# Patient Record
Sex: Female | Born: 1985 | Race: Black or African American | Hispanic: No | Marital: Single | Smoking: Current some day smoker
Health system: Southern US, Community
[De-identification: ages and names within clinical notes are randomized; demographics above are authoritative.]

## PROBLEM LIST (undated history)

## (undated) ENCOUNTER — Inpatient Hospital Stay (HOSPITAL_COMMUNITY): Payer: Self-pay

## (undated) DIAGNOSIS — O99345 Other mental disorders complicating the puerperium: Secondary | ICD-10-CM

## (undated) DIAGNOSIS — J45909 Unspecified asthma, uncomplicated: Secondary | ICD-10-CM

## (undated) DIAGNOSIS — M549 Dorsalgia, unspecified: Secondary | ICD-10-CM

## (undated) DIAGNOSIS — F4310 Post-traumatic stress disorder, unspecified: Secondary | ICD-10-CM

## (undated) DIAGNOSIS — F53 Postpartum depression: Secondary | ICD-10-CM

## (undated) DIAGNOSIS — G5600 Carpal tunnel syndrome, unspecified upper limb: Secondary | ICD-10-CM

## (undated) DIAGNOSIS — I10 Essential (primary) hypertension: Secondary | ICD-10-CM

## (undated) DIAGNOSIS — F419 Anxiety disorder, unspecified: Secondary | ICD-10-CM

---

## 2014-12-08 ENCOUNTER — Emergency Department (HOSPITAL_COMMUNITY)
Admission: EM | Admit: 2014-12-08 | Discharge: 2014-12-09 | Disposition: A | Discharge status: Home / Self Care | Payer: Medicaid Other | Attending: Emergency Medicine | Admitting: Emergency Medicine

## 2014-12-08 ENCOUNTER — Encounter (HOSPITAL_COMMUNITY): Payer: Self-pay | Admitting: Emergency Medicine

## 2014-12-08 DIAGNOSIS — R42 Dizziness and giddiness: Secondary | ICD-10-CM | POA: Diagnosis not present

## 2014-12-08 DIAGNOSIS — I10 Essential (primary) hypertension: Secondary | ICD-10-CM | POA: Diagnosis not present

## 2014-12-08 DIAGNOSIS — R112 Nausea with vomiting, unspecified: Secondary | ICD-10-CM

## 2014-12-08 DIAGNOSIS — F17210 Nicotine dependence, cigarettes, uncomplicated: Secondary | ICD-10-CM | POA: Diagnosis not present

## 2014-12-08 DIAGNOSIS — R197 Diarrhea, unspecified: Secondary | ICD-10-CM | POA: Insufficient documentation

## 2014-12-08 DIAGNOSIS — R1084 Generalized abdominal pain: Secondary | ICD-10-CM | POA: Diagnosis not present

## 2014-12-08 DIAGNOSIS — Z3202 Encounter for pregnancy test, result negative: Secondary | ICD-10-CM | POA: Insufficient documentation

## 2014-12-08 DIAGNOSIS — R51 Headache: Secondary | ICD-10-CM | POA: Insufficient documentation

## 2014-12-08 DIAGNOSIS — R6883 Chills (without fever): Secondary | ICD-10-CM | POA: Diagnosis not present

## 2014-12-08 HISTORY — DX: Essential (primary) hypertension: I10

## 2014-12-08 LAB — URINE MICROSCOPIC-ADD ON: RBC / HPF: NONE SEEN RBC/hpf (ref 0–5)

## 2014-12-08 LAB — CBC WITH DIFFERENTIAL/PLATELET
BASOS ABS: 0.1 10*3/uL (ref 0.0–0.1)
BASOS PCT: 0 %
EOS PCT: 1 %
Eosinophils Absolute: 0.2 10*3/uL (ref 0.0–0.7)
HEMATOCRIT: 49.2 % — AB (ref 36.0–46.0)
Hemoglobin: 18 g/dL — ABNORMAL HIGH (ref 12.0–15.0)
LYMPHS PCT: 10 %
Lymphs Abs: 1.7 10*3/uL (ref 0.7–4.0)
MCH: 32 pg (ref 26.0–34.0)
MCHC: 36.6 g/dL — AB (ref 30.0–36.0)
MCV: 87.5 fL (ref 78.0–100.0)
Monocytes Absolute: 1.5 10*3/uL — ABNORMAL HIGH (ref 0.1–1.0)
Monocytes Relative: 8 %
NEUTROS ABS: 14.3 10*3/uL — AB (ref 1.7–7.7)
Neutrophils Relative %: 81 %
PLATELETS: 373 10*3/uL (ref 150–400)
RBC: 5.62 MIL/uL — ABNORMAL HIGH (ref 3.87–5.11)
RDW: 11.8 % (ref 11.5–15.5)
WBC: 17.7 10*3/uL — AB (ref 4.0–10.5)

## 2014-12-08 LAB — URINALYSIS, ROUTINE W REFLEX MICROSCOPIC
GLUCOSE, UA: NEGATIVE mg/dL
Hgb urine dipstick: NEGATIVE
KETONES UR: 15 mg/dL — AB
NITRITE: NEGATIVE
PH: 6 (ref 5.0–8.0)
PROTEIN: 100 mg/dL — AB
Specific Gravity, Urine: 1.029 (ref 1.005–1.030)

## 2014-12-08 LAB — POC URINE PREG, ED: Preg Test, Ur: NEGATIVE

## 2014-12-08 MED ORDER — ACETAMINOPHEN 325 MG PO TABS
650.0000 mg | ORAL_TABLET | Freq: Once | ORAL | Status: AC
  Administered 2014-12-08: 650 mg via ORAL
  Filled 2014-12-08: qty 2

## 2014-12-08 MED ORDER — ONDANSETRON HCL 4 MG/2ML IJ SOLN
4.0000 mg | Freq: Once | INTRAMUSCULAR | Status: AC
  Administered 2014-12-08: 4 mg via INTRAVENOUS
  Filled 2014-12-08: qty 2

## 2014-12-08 MED ORDER — SODIUM CHLORIDE 0.9 % IV BOLUS (SEPSIS)
1000.0000 mL | Freq: Once | INTRAVENOUS | Status: AC
  Administered 2014-12-09: 1000 mL via INTRAVENOUS

## 2014-12-08 MED ORDER — SODIUM CHLORIDE 0.9 % IV BOLUS (SEPSIS)
1000.0000 mL | Freq: Once | INTRAVENOUS | Status: AC
  Administered 2014-12-08: 1000 mL via INTRAVENOUS

## 2014-12-08 NOTE — ED Notes (Addendum)
Per EMS, patient started vomiting around 2pm today with associated chills.  She has a headache, diarrhea, and vomiting.   Patient is from home.  Patient has not received flu shot.  Patient denies any falls or injuries.  Last BP from EMS was 135/56 was at  21:19 with HR 82  Ortho static vitals:  Lying: 100/60 Sitting: 90/60 Standing: 80 or less  P: 68 R: 26  CBG: 134

## 2014-12-08 NOTE — ED Provider Notes (Signed)
CSN: [id number]     Arrival date & time 12/08/14  2129 History   First MD Initiated Contact with Patient 12/08/14 2131     Chief Complaint  Patient presents with  . Emesis   Selena Haynes is a 29 y.o. female with a history of hypertension who presents to the emergency department complaining of nausea, vomiting, diarrhea, generalized abdominal pain, body aches and headache since today. Patient reports she woke up from a nap with a headache that then progressed to having vomiting and diarrhea. She reports after having vomiting and diarrhea she started having generalized abdominal pain which she rates at a 9 out of 10 currently. She also reports associated nausea and body aches. She reports 15 episodes of vomiting and 15 episodes of diarrhea today. She denies hematemesis, or hematochezia. She reports her previous abdominal surgical history includes a C-section. She reports her last menstrual cycle started 12/07/2014. Patient reports feeling lightheaded with standing. She reports trying to take Motrin for treatment today without relief. The patient denies fevers, syncope, rashes, urinary symptoms, hematemesis, hematochezia, chest pain, or double vision.   (Consider location/radiation/quality/duration/timing/severity/associated sxs/prior Treatment) HPI  Past Medical History  Diagnosis Date  . Hypertension    History reviewed. No pertinent past surgical history. No family history on file. Social History  Substance Use Topics  . Smoking status: Current Every Day Smoker -- 1.00 packs/day    Types: Cigarettes  . Smokeless tobacco: Never Used  . Alcohol Use: Yes   OB History    No data available     Review of Systems  Constitutional: Positive for chills. Negative for fever.  HENT: Negative for congestion, sore throat and trouble swallowing.   Eyes: Negative for visual disturbance.  Respiratory: Negative for cough, shortness of breath and wheezing.   Cardiovascular: Negative for chest pain  and palpitations.  Gastrointestinal: Positive for nausea, vomiting, abdominal pain and diarrhea. Negative for blood in stool.  Genitourinary: Negative for dysuria, urgency, frequency, hematuria, flank pain, decreased urine volume, difficulty urinating, genital sores and menstrual problem.  Musculoskeletal: Positive for myalgias. Negative for back pain, neck pain and neck stiffness.  Skin: Negative for rash and wound.  Neurological: Positive for light-headedness and headaches. Negative for dizziness, syncope, weakness and numbness.      Allergies  Review of patient's allergies indicates no known allergies.  Home Medications   Prior to Admission medications   Medication Sig Start Date End Date Taking? Authorizing Provider  ondansetron (ZOFRAN ODT) 4 MG disintegrating tablet Take 1 tablet (4 mg total) by mouth every 8 (eight) hours as needed for nausea or vomiting. 12/09/14   Everlene FarrierWilliam Arien Morine, PA-C   BP 116/79 mmHg  Pulse 85  Temp(Src) 98 F (36.7 C) (Oral)  Resp 22  SpO2 98%  LMP 12/08/2014 Physical Exam  Constitutional: She is oriented to person, place, and time. She appears well-developed and well-nourished. No distress.  Nontoxic appearing.  HENT:  Head: Normocephalic and atraumatic.  Mouth/Throat: Oropharynx is clear and moist. No oropharyngeal exudate.  Eyes: Conjunctivae and EOM are normal. Pupils are equal, round, and reactive to light. Right eye exhibits no discharge. Left eye exhibits no discharge.  Neck: Normal range of motion. Neck supple. No JVD present. No tracheal deviation present.  Cardiovascular: Normal rate, regular rhythm, normal heart sounds and intact distal pulses.  Exam reveals no gallop and no friction rub.   No murmur heard. Pulmonary/Chest: Effort normal and breath sounds normal. No respiratory distress. She has no wheezes. She has no rales.  Lungs are clear to auscultation bilaterally.  Abdominal: Soft. She exhibits no distension and no mass. There is  tenderness. There is no rebound and no guarding.  Abdomen is soft. Bowel sounds are present. Patient has mild generalized abdominal tenderness to palpation. No focal tenderness. No psoas or operators sign.  Musculoskeletal: She exhibits no edema or tenderness.  Lymphadenopathy:    She has no cervical adenopathy.  Neurological: She is alert and oriented to person, place, and time. No cranial nerve deficit. Coordination normal.  Skin: Skin is warm and dry. No rash noted. She is not diaphoretic. No erythema. No pallor.  Psychiatric: She has a normal mood and affect. Her behavior is normal.  Nursing note and vitals reviewed.   ED Course  Procedures (including critical care time) Labs Review Labs Reviewed  CBC WITH DIFFERENTIAL/PLATELET - Abnormal; Notable for the following:    WBC 17.7 (*)    RBC 5.62 (*)    Hemoglobin 18.0 (*)    HCT 49.2 (*)    MCHC 36.6 (*)    Neutro Abs 14.3 (*)    Monocytes Absolute 1.5 (*)    All other components within normal limits  URINALYSIS, ROUTINE W REFLEX MICROSCOPIC (NOT AT [facility]) - Abnormal; Notable for the following:    Color, Urine ORANGE (*)    APPearance TURBID (*)    Bilirubin Urine SMALL (*)    Ketones, ur 15 (*)    Protein, ur 100 (*)    Leukocytes, UA SMALL (*)    All other components within normal limits  COMPREHENSIVE METABOLIC PANEL - Abnormal; Notable for the following:    CO2 21 (*)    Creatinine, Ser 1.25 (*)    GFR calc non Af Amer 57 (*)    All other components within normal limits  URINE MICROSCOPIC-ADD ON - Abnormal; Notable for the following:    Squamous Epithelial / LPF 0-5 (*)    Bacteria, UA FEW (*)    Casts GRANULAR CAST (*)    All other components within normal limits  URINE CULTURE  LIPASE, BLOOD  POC URINE PREG, ED    Imaging Review No results found. I have personally reviewed and evaluated these lab results as part of my medical decision-making.   EKG Interpretation None      Filed Vitals:   12/08/14 2145  12/09/14 0018  BP: 121/70 116/79  Pulse: 94 85  Temp: 98 F (36.7 C)   TempSrc: Oral   Resp: 18 22  SpO2: 100% 98%     MDM   Meds given in ED:  Medications  sodium chloride 0.9 % bolus 1,000 mL (0 mLs Intravenous Stopped 12/09/14 0018)  ondansetron (ZOFRAN) injection 4 mg (4 mg Intravenous Given 12/08/14 2233)  acetaminophen (TYLENOL) tablet 650 mg (650 mg Oral Given 12/08/14 2233)  sodium chloride 0.9 % bolus 1,000 mL (1,000 mLs Intravenous New Bag/Given 12/09/14 0018)  ondansetron (ZOFRAN) injection 4 mg (4 mg Intravenous Given 12/09/14 0046)  promethazine (PHENERGAN) injection 25 mg (25 mg Intravenous Given 12/09/14 0117)    New Prescriptions   ONDANSETRON (ZOFRAN ODT) 4 MG DISINTEGRATING TABLET    Take 1 tablet (4 mg total) by mouth every 8 (eight) hours as needed for nausea or vomiting.    Final diagnoses:  Nausea vomiting and diarrhea  Generalized abdominal pain   This is a 29 y.o. female with a history of hypertension who presents to the emergency department complaining of nausea, vomiting, diarrhea, generalized abdominal pain, body aches and headache  since today. Patient reports she woke up from a nap with a headache that then progressed to having vomiting and diarrhea. She reports after having vomiting and diarrhea she started having generalized abdominal pain which she rates at a 9 out of 10 currently. She also reports associated nausea and body aches. She reports 15 episodes of vomiting and 15 episodes of diarrhea today. She denies hematemesis, or hematochezia. On exam patient is afebrile nontoxic appearing. Her abdomen is soft and she has mild generalized abdominal tenderness to palpation without focal tenderness. No peritoneal signs. Will check blood work and provide a fluid bolus and zofran.  Patient has negative urine pregnancy test. CBC indicates a white count of 17,000 with a hemoglobin of 18.0. I suspect dehydration. Patient's CMP indicates a creatinine of 1.25.  Patient's lipase is within normal limits at 26. Urinalysis shows small leukocytes and nitrite negative. Patient denies urinary symptoms however urine sent for culture. At reevaluation patient is still having some nausea. Patient is working on her second liter fluid bolus. Patient ordered Phenergan. Patchy change patient is still awaiting Phenergan to come from pharmacy. Patient's care handed off to [facility], PA-C at shift change who will disposition the patient.  I advised the patient to have her creatinine rechecked next week when she is feeling better. I encouraged her to push fluids. She will be sent home with prescriptions for Zofran. I advised the patient to follow-up with their primary care provider this week. I advised the patient to return to the emergency department with new or worsening symptoms or new concerns. The patient verbalized understanding and agreement with plan.    This patient was discussed with Dr. Mora Bellman who agrees with assessment and plan.   Everlene Farrier, PA-C 12/09/14 1610  Tomasita Crumble, MD 12/09/14 [phone]

## 2014-12-08 NOTE — ED Notes (Signed)
Bed: WA02 Expected date:  Expected time:  Means of arrival:  Comments: EMS 29yo F vomiting / diarrhea / headache

## 2014-12-09 LAB — COMPREHENSIVE METABOLIC PANEL
ALBUMIN: 4.4 g/dL (ref 3.5–5.0)
ALK PHOS: 74 U/L (ref 38–126)
ALT: 14 U/L (ref 14–54)
ANION GAP: 9 (ref 5–15)
AST: 15 U/L (ref 15–41)
BILIRUBIN TOTAL: 0.7 mg/dL (ref 0.3–1.2)
BUN: 18 mg/dL (ref 6–20)
CALCIUM: 9.1 mg/dL (ref 8.9–10.3)
CO2: 21 mmol/L — ABNORMAL LOW (ref 22–32)
Chloride: 110 mmol/L (ref 101–111)
Creatinine, Ser: 1.25 mg/dL — ABNORMAL HIGH (ref 0.44–1.00)
GFR, EST NON AFRICAN AMERICAN: 57 mL/min — AB (ref >60)
Glucose, Bld: 95 mg/dL (ref 65–99)
POTASSIUM: 4.1 mmol/L (ref 3.5–5.1)
Sodium: 140 mmol/L (ref 135–145)
TOTAL PROTEIN: 7.3 g/dL (ref 6.5–8.1)

## 2014-12-09 LAB — LIPASE, BLOOD: LIPASE: 26 U/L (ref 11–51)

## 2014-12-09 MED ORDER — PROMETHAZINE HCL 25 MG/ML IJ SOLN
25.0000 mg | Freq: Once | INTRAMUSCULAR | Status: AC
  Administered 2014-12-09: 25 mg via INTRAVENOUS
  Filled 2014-12-09: qty 1

## 2014-12-09 MED ORDER — ONDANSETRON 4 MG PO TBDP: 4.0000 mg | ORAL_TABLET | Freq: Three times a day (TID) | ORAL | Status: DC | PRN

## 2014-12-09 MED ORDER — ONDANSETRON HCL 4 MG/2ML IJ SOLN
4.0000 mg | Freq: Once | INTRAMUSCULAR | Status: AC
  Administered 2014-12-09: 4 mg via INTRAVENOUS
  Filled 2014-12-09: qty 2

## 2014-12-09 NOTE — Discharge Instructions (Signed)
Abdominal Pain, Adult °Many things can cause abdominal pain. Usually, abdominal pain is not caused by a disease and will improve without treatment. It can often be observed and treated at home. Your health care provider will do a physical exam and possibly order blood tests and X-rays to help determine the seriousness of your pain. However, in many cases, more time must pass before a clear cause of the pain can be found. Before that point, your health care provider may not know if you need more testing or further treatment. °HOME CARE INSTRUCTIONS °Monitor your abdominal pain for any changes. The following actions may help to alleviate any discomfort you are experiencing: °· Only take over-the-counter or prescription medicines as directed by your health care provider. °· Do not take laxatives unless directed to do so by your health care provider. °· Try a clear liquid diet (broth, tea, or water) as directed by your health care provider. Slowly move to a bland diet as tolerated. °SEEK MEDICAL CARE IF: °· You have unexplained abdominal pain. °· You have abdominal pain associated with nausea or diarrhea. °· You have pain when you urinate or have a bowel movement. °· You experience abdominal pain that wakes you in the night. °· You have abdominal pain that is worsened or improved by eating food. °· You have abdominal pain that is worsened with eating fatty foods. °· You have a fever. °SEEK IMMEDIATE MEDICAL CARE IF: °· Your pain does not go away within 2 hours. °· You keep throwing up (vomiting). °· Your pain is felt only in portions of the abdomen, such as the right side or the left lower portion of the abdomen. °· You pass bloody or black tarry stools. °MAKE SURE YOU: °· Understand these instructions. °· Will watch your condition. °· Will get help right away if you are not doing well or get worse. °  °This information is not intended to replace advice given to you by your health care provider. Make sure you discuss  any questions you have with your health care provider. °  °Document Released: 10/17/2004 Document Revised: 09/28/2014 Document Reviewed: 09/16/2012 °Elsevier Interactive Patient Education ©2016 [employer] °Nausea and Vomiting °Nausea is a sick feeling that often comes before throwing up (vomiting). Vomiting is a reflex where stomach contents come out of your mouth. Vomiting can cause severe loss of body fluids (dehydration). Children and elderly adults can become dehydrated quickly, especially if they also have diarrhea. Nausea and vomiting are symptoms of a condition or disease. It is important to find the cause of your symptoms. °CAUSES  °· Direct irritation of the stomach lining. This irritation can result from increased acid production (gastroesophageal reflux disease), infection, food poisoning, taking certain medicines (such as nonsteroidal anti-inflammatory drugs), alcohol use, or tobacco use. °· Signals from the brain. These signals could be caused by a headache, heat exposure, an inner ear disturbance, increased pressure in the brain from injury, infection, a tumor, or a concussion, pain, emotional stimulus, or metabolic problems. °· An obstruction in the gastrointestinal tract (bowel obstruction). °· Illnesses such as diabetes, hepatitis, gallbladder problems, appendicitis, kidney problems, cancer, sepsis, atypical symptoms of a heart attack, or eating disorders. °· Medical treatments such as chemotherapy and radiation. °· Receiving medicine that makes you sleep (general anesthetic) during surgery. °DIAGNOSIS °Your caregiver may ask for tests to be done if the problems do not improve after a few days. Tests may also be done if symptoms are severe or if the reason for the nausea   and vomiting is not clear. Tests may include: °· Urine tests. °· Blood tests. °· Stool tests. °· Cultures (to look for evidence of infection). °· X-rays or other imaging studies. °Test results can help your caregiver make  decisions about treatment or the need for additional tests. °TREATMENT °You need to stay well hydrated. Drink frequently but in small amounts. You may wish to drink water, sports drinks, clear broth, or eat frozen ice pops or gelatin dessert to help stay hydrated. When you eat, eating slowly may help prevent nausea. There are also some antinausea medicines that may help prevent nausea. °HOME CARE INSTRUCTIONS  °· Take all medicine as directed by your caregiver. °· If you do not have an appetite, do not force yourself to eat. However, you must continue to drink fluids. °· If you have an appetite, eat a normal diet unless your caregiver tells you differently. °· Eat a variety of complex carbohydrates (rice, wheat, potatoes, bread), lean meats, yogurt, fruits, and vegetables. °· Avoid high-fat foods because they are more difficult to digest. °· Drink enough water and fluids to keep your urine clear or pale yellow. °· If you are dehydrated, ask your caregiver for specific rehydration instructions. Signs of dehydration may include: °· Severe thirst. °· Dry lips and mouth. °· Dizziness. °· Dark urine. °· Decreasing urine frequency and amount. °· Confusion. °· Rapid breathing or pulse. °SEEK IMMEDIATE MEDICAL CARE IF:  °· You have blood or brown flecks (like coffee grounds) in your vomit. °· You have black or bloody stools. °· You have a severe headache or stiff neck. °· You are confused. °· You have severe abdominal pain. °· You have chest pain or trouble breathing. °· You do not urinate at least once every 8 hours. °· You develop cold or clammy skin. °· You continue to vomit for longer than 24 to 48 hours. °· You have a fever. °MAKE SURE YOU:  °· Understand these instructions. °· Will watch your condition. °· Will get help right away if you are not doing well or get worse. °  °This information is not intended to replace advice given to you by your health care provider. Make sure you discuss any questions you have with  your health care provider. °  °Document Released: 01/07/2005 Document Revised: 04/01/2011 Document Reviewed: 06/06/2010 °Elsevier Interactive Patient Education ©2016 [employer] °Diarrhea °Diarrhea is frequent loose and watery bowel movements. It can cause you to feel weak and dehydrated. Dehydration can cause you to become tired and thirsty, have a dry mouth, and have decreased urination that often is dark yellow. Diarrhea is a sign of another problem, most often an infection that will not last long. In most cases, diarrhea typically lasts 2-3 days. However, it can last longer if it is a sign of something more serious. It is important to treat your diarrhea as directed by your caregiver to lessen or prevent future episodes of diarrhea. °CAUSES  °Some common causes include: °· Gastrointestinal infections caused by viruses, bacteria, or parasites. °· Food poisoning or food allergies. °· Certain medicines, such as antibiotics, chemotherapy, and laxatives. °· Artificial sweeteners and fructose. °· Digestive disorders. °HOME CARE INSTRUCTIONS °· Ensure adequate fluid intake (hydration): Have 1 cup (8 oz) of fluid for each diarrhea episode. Avoid fluids that contain simple sugars or sports drinks, fruit juices, whole milk products, and sodas. Your urine should be clear or pale yellow if you are drinking enough fluids. Hydrate with an oral rehydration solution that you can purchase at pharmacies, retail   stores, and online. You can prepare an oral rehydration solution at home by mixing the following ingredients together: °¨  - tsp table salt. °¨ ¾ tsp baking soda. °¨  tsp salt substitute containing potassium chloride. °¨ 1  tablespoons sugar. °¨ 1 L (34 oz) of water. °· Certain foods and beverages may increase the speed at which food moves through the gastrointestinal (GI) tract. These foods and beverages should be avoided and include: °¨ Caffeinated and alcoholic beverages. °¨ High-fiber foods, such as raw fruits and  vegetables, nuts, seeds, and whole grain breads and cereals. °¨ Foods and beverages sweetened with sugar alcohols, such as xylitol, sorbitol, and mannitol. °· Some foods may be well tolerated and may help thicken stool including: °¨ Starchy foods, such as rice, toast, pasta, low-sugar cereal, oatmeal, grits, baked potatoes, crackers, and bagels. °¨ Bananas. °¨ Applesauce. °· Add probiotic-rich foods to help increase healthy bacteria in the GI tract, such as yogurt and fermented milk products. °· Wash your hands well after each diarrhea episode. °· Only take over-the-counter or prescription medicines as directed by your caregiver. °· Take a warm bath to relieve any burning or pain from frequent diarrhea episodes. °SEEK IMMEDIATE MEDICAL CARE IF:  °· You are unable to keep fluids down. °· You have persistent vomiting. °· You have blood in your stool, or your stools are black and tarry. °· You do not urinate in 6-8 hours, or there is only a small amount of very dark urine. °· You have abdominal pain that increases or localizes. °· You have weakness, dizziness, confusion, or light-headedness. °· You have a severe headache. °· Your diarrhea gets worse or does not get better. °· You have a fever or persistent symptoms for more than 2-3 days. °· You have a fever and your symptoms suddenly get worse. °MAKE SURE YOU:  °· Understand these instructions. °· Will watch your condition. °· Will get help right away if you are not doing well or get worse. °  °This information is not intended to replace advice given to you by your health care provider. Make sure you discuss any questions you have with your health care provider. °  °Document Released: 12/28/2001 Document Revised: 01/28/2014 Document Reviewed: 09/15/2011 °Elsevier Interactive Patient Education ©2016 [employer] Inc. ° ° °

## 2014-12-09 NOTE — ED Notes (Signed)
Water given to patient 

## 2014-12-10 LAB — URINE CULTURE: Culture: NO GROWTH

## 2015-07-18 ENCOUNTER — Ambulatory Visit: Payer: Self-pay | Admitting: Allergy and Immunology

## 2016-01-22 NOTE — L&D Delivery Note (Signed)
Patient is 31 y.o. [id number] admitted IOL for cHTN and TOLAC. S/p IOL with pitocin.  Delivery Note At 10:57 PM a viable female was delivered via Vaginal, Spontaneous Delivery (Presentation: ROA).  APGAR: per nursing documentation; weight pending.   Placenta status: spontaneous, intact.  Cord: 3 vessel  Anesthesia:  Epidural Episiotomy: None Lacerations:   First degree vaginal Suture Repair: 2.0 vicryl Est. Blood Loss (mL):  350  Mom to postpartum.  Baby to Couplet care / Skin to Skin.   Upon arrival patient was complete and pushing. She pushed with good maternal effort to deliver a viable female infant in cephalic, ROA position. No nuchal cord present.  The shoulders were not forth-coming with traction.  Drenda FreezeFran (CNM) stepped in andThe posterior right axilla was grasped with an index finger and the baby was rotated clockwise.  The anterior shoulder released and the arm was delivered. Delivery turned back over to MD.   The body followed without difficulty.  At no point was excess traction placed on the fetal head.  Baby was noted to have good tone and placed on maternal abdomen for oral suctioning, drying and stimulation. Delayed cord clamping performed. Placenta delivered spontaneously with gentle cord traction. Fundus firm with massage and Pitocin. Perineum inspected and found to have first degree laceration, which was repaired with 2-0 vicryl with good hemostasis achieved. Counts of sharps, instruments, and lap pads were all correct.  Larene BeachMary K Key, DO PGY-2 10/03/2016, 11:19 PM  The above was performed by Dr. Freada BergeronKEy under my direct supervision and guidance.

## 2016-02-17 ENCOUNTER — Emergency Department (HOSPITAL_COMMUNITY): Payer: Medicaid Other

## 2016-02-17 ENCOUNTER — Encounter (HOSPITAL_COMMUNITY): Payer: Self-pay

## 2016-02-17 ENCOUNTER — Emergency Department (HOSPITAL_COMMUNITY)
Admission: EM | Admit: 2016-02-17 | Discharge: 2016-02-17 | Disposition: A | Discharge status: Home / Self Care | Payer: Medicaid Other | Attending: Emergency Medicine | Admitting: Emergency Medicine

## 2016-02-17 DIAGNOSIS — O209 Hemorrhage in early pregnancy, unspecified: Secondary | ICD-10-CM | POA: Diagnosis not present

## 2016-02-17 DIAGNOSIS — I10 Essential (primary) hypertension: Secondary | ICD-10-CM | POA: Diagnosis not present

## 2016-02-17 DIAGNOSIS — F17210 Nicotine dependence, cigarettes, uncomplicated: Secondary | ICD-10-CM | POA: Diagnosis not present

## 2016-02-17 DIAGNOSIS — N898 Other specified noninflammatory disorders of vagina: Secondary | ICD-10-CM | POA: Insufficient documentation

## 2016-02-17 DIAGNOSIS — O99331 Smoking (tobacco) complicating pregnancy, first trimester: Secondary | ICD-10-CM | POA: Insufficient documentation

## 2016-02-17 DIAGNOSIS — Z3A01 Less than 8 weeks gestation of pregnancy: Secondary | ICD-10-CM | POA: Diagnosis not present

## 2016-02-17 DIAGNOSIS — O4690 Antepartum hemorrhage, unspecified, unspecified trimester: Secondary | ICD-10-CM

## 2016-02-17 LAB — ABO/RH: ABO/RH(D): A POS

## 2016-02-17 LAB — COMPREHENSIVE METABOLIC PANEL
ALT: 10 U/L — ABNORMAL LOW (ref 14–54)
AST: 13 U/L — AB (ref 15–41)
Albumin: 3.7 g/dL (ref 3.5–5.0)
Alkaline Phosphatase: 46 U/L (ref 38–126)
Anion gap: 8 (ref 5–15)
BUN: 5 mg/dL — AB (ref 6–20)
CHLORIDE: 106 mmol/L (ref 101–111)
CO2: 22 mmol/L (ref 22–32)
CREATININE: 0.7 mg/dL (ref 0.44–1.00)
Calcium: 9.1 mg/dL (ref 8.9–10.3)
GFR calc Af Amer: >60 mL/min (ref >60)
GFR calc non Af Amer: >60 mL/min (ref >60)
Glucose, Bld: 95 mg/dL (ref 65–99)
Potassium: 3.7 mmol/L (ref 3.5–5.1)
SODIUM: 136 mmol/L (ref 135–145)
Total Bilirubin: 0.5 mg/dL (ref 0.3–1.2)
Total Protein: 6.5 g/dL (ref 6.5–8.1)

## 2016-02-17 LAB — URINALYSIS, ROUTINE W REFLEX MICROSCOPIC
BILIRUBIN URINE: NEGATIVE
GLUCOSE, UA: NEGATIVE mg/dL
KETONES UR: NEGATIVE mg/dL
LEUKOCYTES UA: NEGATIVE
NITRITE: NEGATIVE
Protein, ur: NEGATIVE mg/dL
Specific Gravity, Urine: 1.016 (ref 1.005–1.030)
pH: 7 (ref 5.0–8.0)

## 2016-02-17 LAB — CBC WITH DIFFERENTIAL/PLATELET
BASOS ABS: 0 10*3/uL (ref 0.0–0.1)
Basophils Relative: 0 %
EOS ABS: 0.4 10*3/uL (ref 0.0–0.7)
EOS PCT: 5 %
HCT: 37.2 % (ref 36.0–46.0)
HEMOGLOBIN: 12.9 g/dL (ref 12.0–15.0)
LYMPHS PCT: 34 %
Lymphs Abs: 2.7 10*3/uL (ref 0.7–4.0)
MCH: 30.4 pg (ref 26.0–34.0)
MCHC: 34.7 g/dL (ref 30.0–36.0)
MCV: 87.5 fL (ref 78.0–100.0)
Monocytes Absolute: 0.5 10*3/uL (ref 0.1–1.0)
Monocytes Relative: 6 %
NEUTROS PCT: 55 %
Neutro Abs: 4.4 10*3/uL (ref 1.7–7.7)
PLATELETS: 284 10*3/uL (ref 150–400)
RBC: 4.25 MIL/uL (ref 3.87–5.11)
RDW: 12.1 % (ref 11.5–15.5)
WBC: 7.9 10*3/uL (ref 4.0–10.5)

## 2016-02-17 LAB — WET PREP, GENITAL
Clue Cells Wet Prep HPF POC: NONE SEEN
SPERM: NONE SEEN
Trich, Wet Prep: NONE SEEN
Yeast Wet Prep HPF POC: NONE SEEN

## 2016-02-17 LAB — HCG, QUANTITATIVE, PREGNANCY: hCG, Beta Chain, Quant, S: 19235 m[IU]/mL — ABNORMAL HIGH (ref <5)

## 2016-02-17 MED ORDER — PRENATAL COMPLETE 14-0.4 MG PO TABS: 1.0000 | ORAL_TABLET | Freq: Every day | ORAL | 0 refills | Status: DC

## 2016-02-17 MED ORDER — ONDANSETRON 4 MG PO TBDP
4.0000 mg | ORAL_TABLET | Freq: Once | ORAL | Status: AC
  Administered 2016-02-17: 4 mg via ORAL
  Filled 2016-02-17: qty 1

## 2016-02-17 MED ORDER — ACETAMINOPHEN 325 MG PO TABS
650.0000 mg | ORAL_TABLET | Freq: Once | ORAL | Status: AC
  Administered 2016-02-17: 650 mg via ORAL
  Filled 2016-02-17: qty 2

## 2016-02-17 NOTE — ED Provider Notes (Signed)
[facility]-EMER[facility]ENCY DEPT Provider Note   CSN: [id number] Arrival date & time: 02/17/16  1513     History   Chief Complaint Chief Complaint  Patient presents with  . Vaginal Bleeding    HPI Selena Haynes is a 31 y.o. female.  The history is provided by the patient and medical records.  Vaginal Bleeding  Primary symptoms include vaginal bleeding. Associated symptoms include abdominal pain (cramping) and nausea.     31 year old female with history of hypertension, presenting to the ED for vaginal bleeding. Patient is G3 P2, approximately [redacted] weeks gestation (LMP 12/31/15).  States this morning she was having some white colored vaginal discharge and shortly after developed some vaginal bleeding.  States initially it seemed like a period.  States bleeding has slacked off some since onset but still present.  Denies passage of clots.  Does report some lower abdominal cramping and nausea but denies vomiting. Has been able to eat and drink normally. No diarrhea, bowel movements normal. Patient reports with prior pregnancy she did have with bleeding, and was told she had a hematoma in her uterus. States delivery was without issue, but she had to be watched closely during her pregnancy. Patient is not yet seen an OB/GYN, states upcoming appointment on 02/26/2016.  Past Medical History:  Diagnosis Date  . Hypertension     There are no active problems to display for this patient.   History reviewed. No pertinent surgical history.  OB History    Gravida Para Term Preterm AB Living   1             SAB TAB Ectopic Multiple Live Births                   Home Medications    Prior to Admission medications   Medication Sig Start Date End Date Taking? Authorizing Provider  ondansetron (ZOFRAN ODT) 4 MG disintegrating tablet Take 1 tablet (4 mg total) by mouth every 8 (eight) hours as needed for nausea or vomiting. 12/09/14   Everlene FarrierWilliam Dansie, PA-C    Family History History reviewed. No  pertinent family history.  Social History Social History  Substance Use Topics  . Smoking status: Current Every Day Smoker    Packs/day: 1.00    Types: Cigarettes  . Smokeless tobacco: Never Used  . Alcohol use Yes     Allergies   Patient has no known allergies.   Review of Systems Review of Systems  Gastrointestinal: Positive for abdominal pain (cramping) and nausea.  Genitourinary: Positive for vaginal bleeding.  All other systems reviewed and are negative.    Physical Exam Updated Vital Signs BP 112/80 (BP Location: Left Arm)   Pulse 65   Temp 98.2 F (36.8 C) (Oral)   Resp 16   LMP 01/05/2016   SpO2 100%   Physical Exam  Constitutional: She is oriented to person, place, and time. She appears well-developed and well-nourished.  HENT:  Head: Normocephalic and atraumatic.  Mouth/Throat: Oropharynx is clear and moist.  Eyes: Conjunctivae and EOM are normal. Pupils are equal, round, and reactive to light.  Neck: Normal range of motion.  Cardiovascular: Normal rate, regular rhythm and normal heart sounds.   Pulmonary/Chest: Effort normal and breath sounds normal. No respiratory distress. She has no wheezes.  Abdominal: Soft. Bowel sounds are normal. There is no tenderness. There is no rebound.  Genitourinary:  Genitourinary Comments: Normal female external genitalia without visible lesions or rashes; moderate amount of dark red blood in vaginal vault;  cervical os closed; no appreciable discharge  Musculoskeletal: Normal range of motion.  Neurological: She is alert and oriented to person, place, and time.  Skin: Skin is warm and dry.  Psychiatric: She has a normal mood and affect.  Nursing note and vitals reviewed.    ED Treatments / Results  Labs (all labs ordered are listed, but only abnormal results are displayed) Labs Reviewed  WET PREP, GENITAL - Abnormal; Notable for the following:       Result Value   WBC, Wet Prep HPF POC MODERATE (*)    All other  components within normal limits  HCG, QUANTITATIVE, PREGNANCY - Abnormal; Notable for the following:    hCG, Beta Chain, Quant, S 19,235 (*)    All other components within normal limits  COMPREHENSIVE METABOLIC PANEL - Abnormal; Notable for the following:    BUN 5 (*)    AST 13 (*)    ALT 10 (*)    All other components within normal limits  URINALYSIS, ROUTINE W REFLEX MICROSCOPIC - Abnormal; Notable for the following:    Hgb urine dipstick MODERATE (*)    Bacteria, UA RARE (*)    Squamous Epithelial / LPF 0-5 (*)    All other components within normal limits  CBC WITH DIFFERENTIAL/PLATELET  HIV ANTIBODY (ROUTINE TESTING)  RPR  ABO/RH  GC/CHLAMYDIA PROBE AMP ([place]) NOT AT [facility]    EKG  EKG Interpretation None       Radiology US Ob Comp Less 14 Wks  Result Date: 02/17/2016 CLINICAL DATA:  Pregnant patient with vaginal bleeding. EXAM: OBSTETRIC <14 WK [place] AND TRANSVAGINAL OB US TECHNIQUE: Both transabdominal and transvaginal ultrasound examinations were performed for complete evaluation of the gestation as well as the maternal uterus, adnexal regions, and pelvic cul-de-sac. Transvaginal technique was performed to assess early pregnancy. COMPARISON:  None. FINDINGS: Intrauterine gestational sac: Single Yolk sac:  Visualized. Embryo:  Visualized. Cardiac Activity: Visualized. Heart Rate: 125  bpm CRL:  11.4  mm   7 w   2 d                  [place] EDC: 10/03/2016 Subchorionic hemorrhage:  None visualized. Maternal uterus/adnexae: There is a 2.2 x 2.7 x 2.5 cm complex cyst within the right ovary. Normal left ovary. No free fluid in the pelvis. IMPRESSION: Single live intrauterine gestation.  No subchorionic hemorrhage. Electronically Signed   By: Annia Belt M.D.   On: 02/17/2016 21:16   US Ob Transvaginal  Result Date: 02/17/2016 CLINICAL DATA:  Pregnant patient with vaginal bleeding. EXAM: OBSTETRIC <14 WK [place] AND TRANSVAGINAL OB US TECHNIQUE: Both transabdominal and transvaginal  ultrasound examinations were performed for complete evaluation of the gestation as well as the maternal uterus, adnexal regions, and pelvic cul-de-sac. Transvaginal technique was performed to assess early pregnancy. COMPARISON:  None. FINDINGS: Intrauterine gestational sac: Single Yolk sac:  Visualized. Embryo:  Visualized. Cardiac Activity: Visualized. Heart Rate: 125  bpm CRL:  11.4  mm   7 w   2 d                  [place] EDC: 10/03/2016 Subchorionic hemorrhage:  None visualized. Maternal uterus/adnexae: There is a 2.2 x 2.7 x 2.5 cm complex cyst within the right ovary. Normal left ovary. No free fluid in the pelvis. IMPRESSION: Single live intrauterine gestation.  No subchorionic hemorrhage. Electronically Signed   By: Annia Belt M.D.   On: 02/17/2016 21:16    Procedures Procedures (  including critical care time)  Medications Ordered in ED Medications - No data to display   Initial Impression / Assessment and Plan / ED Course  I have reviewed the triage vital signs and the nursing notes.  Pertinent labs & imaging results that were available during my care of the patient were reviewed by me and considered in my medical decision making (see chart for details).  31 year old G3 P2 approximately [redacted] weeks gestation, here with vaginal bleeding. Reports lower abdominal cramping. She is afebrile and nontoxic. Labwork is overall reassuring. UA with blood noted but no signs of infection. Sharene Butters is appropriate for estimated age. H&H is stable.  Patient is A+, not a candidate for exam. Ultrasound obtained, single live IUP without complicated features. Will start on prenatal vitamins. I've encouraged her to follow-up closely with OB-- has appt scheduled on 02/26/16.  Encouraged pelvic rest, given bleeding precautions.  Will need eval at [facility] soon if symptoms worsen.  Discussed plan with patient, she acknowledged understanding and agreed with plan of care.  Return precautions given for new or worsening  symptoms.  Final Clinical Impressions(s) / ED Diagnoses   Final diagnoses:  Vaginal bleeding in pregnancy    New Prescriptions Discharge Medication List as of 02/17/2016  9:39 PM    START taking these medications   Details  Prenatal Vit-Fe Fumarate-FA (PRENATAL COMPLETE) 14-0.4 MG TABS Take 1 tablet by mouth daily., Starting Sat 02/17/2016, Print         Garlon Hatchet, PA-C 02/17/16 2206    Rolland Porter, MD 03/01/16 [phone]

## 2016-02-17 NOTE — ED Triage Notes (Signed)
Pt states she began having vaginal bleeding today. She reports she is pregnant but is unsure how far along. LMP was 01/05/2016. OB appt on 2/5

## 2016-02-17 NOTE — ED Notes (Signed)
Pt requesting pain & nausea medicine, Allyne GeeSanders PA aware

## 2016-02-17 NOTE — ED Notes (Signed)
Pt transported to US

## 2016-02-17 NOTE — Discharge Instructions (Signed)
Take the prescribed medication as directed. Follow-up with the women's hospital[facility]clinic at your scheduled appt on 02/26/16, however if you have worsening bleeding, passage of clots, worsening abdominal pain/cramping etc, you should be evaluated sooner.

## 2016-02-17 NOTE — ED Notes (Signed)
Pelvic Cart at bedside 

## 2016-02-18 LAB — RPR: RPR: NONREACTIVE

## 2016-02-18 LAB — HIV ANTIBODY (ROUTINE TESTING W REFLEX): HIV SCREEN 4TH GENERATION: NONREACTIVE

## 2016-02-20 LAB — GC/CHLAMYDIA PROBE AMP (~~LOC~~) NOT AT ARMC
CHLAMYDIA, DNA PROBE: NEGATIVE
Neisseria Gonorrhea: NEGATIVE

## 2016-02-22 ENCOUNTER — Inpatient Hospital Stay (HOSPITAL_COMMUNITY): Payer: Medicaid Other

## 2016-02-22 ENCOUNTER — Encounter (HOSPITAL_COMMUNITY): Payer: Self-pay | Admitting: *Deleted

## 2016-02-22 ENCOUNTER — Inpatient Hospital Stay (HOSPITAL_COMMUNITY)
Admission: AD | Admit: 2016-02-22 | Discharge: 2016-02-22 | Disposition: A | Discharge status: Home / Self Care | Payer: Medicaid Other | Source: Ambulatory Visit | Attending: Obstetrics and Gynecology | Admitting: Obstetrics and Gynecology

## 2016-02-22 DIAGNOSIS — O209 Hemorrhage in early pregnancy, unspecified: Secondary | ICD-10-CM | POA: Diagnosis not present

## 2016-02-22 DIAGNOSIS — Z3A08 8 weeks gestation of pregnancy: Secondary | ICD-10-CM | POA: Insufficient documentation

## 2016-02-22 DIAGNOSIS — O99331 Smoking (tobacco) complicating pregnancy, first trimester: Secondary | ICD-10-CM | POA: Diagnosis not present

## 2016-02-22 DIAGNOSIS — F17210 Nicotine dependence, cigarettes, uncomplicated: Secondary | ICD-10-CM | POA: Insufficient documentation

## 2016-02-22 LAB — URINALYSIS, ROUTINE W REFLEX MICROSCOPIC
Bilirubin Urine: NEGATIVE
GLUCOSE, UA: NEGATIVE mg/dL
Ketones, ur: NEGATIVE mg/dL
Leukocytes, UA: NEGATIVE
Nitrite: NEGATIVE
PH: 6 (ref 5.0–8.0)
Protein, ur: 30 mg/dL — AB
SPECIFIC GRAVITY, URINE: 1.025 (ref 1.005–1.030)

## 2016-02-22 LAB — CBC
HEMATOCRIT: 34.5 % — AB (ref 36.0–46.0)
HEMOGLOBIN: 12 g/dL (ref 12.0–15.0)
MCH: 30 pg (ref 26.0–34.0)
MCHC: 34.8 g/dL (ref 30.0–36.0)
MCV: 86.3 fL (ref 78.0–100.0)
Platelets: 270 10*3/uL (ref 150–400)
RBC: 4 MIL/uL (ref 3.87–5.11)
RDW: 12.1 % (ref 11.5–15.5)
WBC: 7.9 10*3/uL (ref 4.0–10.5)

## 2016-02-22 LAB — HCG, QUANTITATIVE, PREGNANCY: hCG, Beta Chain, Quant, S: 41252 m[IU]/mL — ABNORMAL HIGH (ref <5)

## 2016-02-22 MED ORDER — PROMETHAZINE HCL 12.5 MG PO TABS: 12.5000 mg | ORAL_TABLET | Freq: Four times a day (QID) | ORAL | 0 refills | Status: DC | PRN

## 2016-02-22 NOTE — MAU Note (Signed)
Pt reports she had some vag bleeding last Saturday. [facility] and [facility] told her the heartbeat was ok and she had some bleeding around the placenta. Has had some spotting off and on for a few days but had heavier bleeding today and a little cramping.

## 2016-02-22 NOTE — MAU Provider Note (Signed)
Chief Complaint: Vaginal Bleeding   SUBJECTIVE HPI: Selena Haynes is a 31 y.o. G3P2002 at [redacted]w[redacted]d who presents to M[facility] Admissions reporting vaginal bleeding.   Patient just recently was seen in the ER for vaginal bleeding. She had a transvaginal ultrasound on 1/27, 5 days ago and had a IUP with cardiac activity of 125 bpm. She was found to also have a quantitative hCG count of 19,235, and blood type of A+. She states she only had small amount of spotting after she left the ER, however this morning while she was urinating had a large gush of blood and clot that ended up in the toilet. She does not recall of there was any tissue in the blood. Now she is only having arc brownish red spotting since that incident this morning. She also states she has a little bit of mild cramping. She has elicited some dizziness, lightheadedness. She has had 2 prior full-term babies 1 was vaginal and the other C-section. She has never had a miscarriage previously. This was an unplanned pregnancy.     Past Medical History:  Diagnosis Date  . Hypertension    OB History  Gravida Para Term Preterm AB Living  3 2 2     2   SAB TAB Ectopic Multiple Live Births          2    # Outcome Date GA Lbr Len/2nd Weight Sex Delivery Anes PTL Lv  3 Current           2 Term 08/30/09 [redacted]w[redacted]d   M CS-LTranv   LIV  1 Term 05/16/01 [redacted]w[redacted]d   F Vag-Spont   LIV     Past Surgical History:  Procedure Laterality Date  . CESAREAN SECTION     Social History   Social History  . Marital status: Single    Spouse name: N/A  . Number of children: N/A  . Years of education: N/A   Occupational History  . Not on file.   Social History Main Topics  . Smoking status: Current Every Day Smoker    Packs/day: 1.00    Types: Cigarettes  . Smokeless tobacco: Former Neurosurgeon  . Alcohol use Yes  . Drug use: No  . Sexual activity: Yes   Other Topics Concern  . Not on file   Social History Narrative  . No narrative on file   No current  facility-administered medications on file prior to encounter.    Current Outpatient Prescriptions on File Prior to Encounter  Medication Sig Dispense Refill  . ondansetron (ZOFRAN ODT) 4 MG disintegrating tablet Take 1 tablet (4 mg total) by mouth every 8 (eight) hours as needed for nausea or vomiting. (Patient not taking: Reported on 02/22/2016) 10 tablet 0  . Prenatal Vit-Fe Fumarate-FA (PRENATAL COMPLETE) 14-0.4 MG TABS Take 1 tablet by mouth daily. (Patient not taking: Reported on 02/22/2016) 60 each 0   No Known Allergies  I have reviewed the past Medical Hx, Surgical Hx, Social Hx, Allergies and Medications.   REVIEW OF SYSTEMS  A comprehensive ROS was negative except per HPI.    OBJECTIVE Patient Vitals for the past 24 hrs:  BP Temp Temp src Pulse Resp Height Weight  02/22/16 1152 114/68 98.4 F (36.9 C) Oral 91 18 5\' 6"  (1.676 m) 135 lb 0.6 oz (61.3 kg)    PHYSICAL EXAM Constitutional: Well-developed, well-nourished female in no acute distress.  Cardiovascular: normal rate, rhythm, no murmurs Respiratory: normal rate and effort. CTAB GI: Abd soft, minimal suprapubic tenderness,  non-distended. Pos BS x 4 MS: Extremities nontender, no edema, normal ROM Neurologic: Alert and oriented x 4.  GU: Neg CVAT. SPECULUM EXAM: NEFG, dark old blood noted without clots about 10cc, no active bleeding from cervical os, cervix clean BIMANUAL: cervix closed; uterus normal size for GA, no adnexal tenderness or masses. No CMT.  LAB RESULTS Results for orders placed or performed during the hospital encounter of 02/22/16 (from the past 24 hour(s))  Urinalysis, Routine w reflex microscopic     Status: Abnormal   Collection Time: 02/22/16 11:55 AM  Result Value Ref Range   Color, Urine YELLOW YELLOW   APPearance HAZY (A) CLEAR   Specific Gravity, Urine 1.025 1.005 - 1.030   pH 6.0 5.0 - 8.0   Glucose, UA NEGATIVE NEGATIVE mg/dL   Hgb urine dipstick LARGE (A) NEGATIVE   Bilirubin Urine  NEGATIVE NEGATIVE   Ketones, ur NEGATIVE NEGATIVE mg/dL   Protein, ur 30 (A) NEGATIVE mg/dL   Nitrite NEGATIVE NEGATIVE   Leukocytes, UA NEGATIVE NEGATIVE   RBC / HPF 0-5 0 - 5 RBC/hpf   WBC, UA 0-5 0 - 5 WBC/hpf   Bacteria, UA RARE (A) NONE SEEN   Squamous Epithelial / LPF 6-30 (A) NONE SEEN   Mucous PRESENT   CBC     Status: Abnormal   Collection Time: 02/22/16  1:14 PM  Result Value Ref Range   WBC 7.9 4.0 - 10.5 K/uL   RBC 4.00 3.87 - 5.11 MIL/uL   Hemoglobin 12.0 12.0 - 15.0 g/dL   HCT 16.134.5 (L) 09.636.0 - 04.546.0 %   MCV 86.3 78.0 - 100.0 fL   MCH 30.0 26.0 - 34.0 pg   MCHC 34.8 30.0 - 36.0 g/dL   RDW 40.912.1 81.111.5 - 91.415.5 %   Platelets 270 150 - 400 K/uL  hCG, quantitative, pregnancy     Status: Abnormal   Collection Time: 02/22/16  1:19 PM  Result Value Ref Range   hCG, Beta Chain, Quant, S 41,252 (H) <5 mIU/mL    IMAGING [place] Ob Comp Less 14 Wks  Result Date: 02/17/2016 CLINICAL DATA:  Pregnant patient with vaginal bleeding. EXAM: OBSTETRIC <14 WK US AND TRANSVAGINAL OB US TECHNIQUE: Both transabdominal and transvaginal ultrasound examinations were performed for complete evaluation of the gestation as well as the maternal uterus, adnexal regions, and pelvic cul-de-sac. Transvaginal technique was performed to assess early pregnancy. COMPARISON:  None. FINDINGS: Intrauterine gestational sac: Single Yolk sac:  Visualized. Embryo:  Visualized. Cardiac Activity: Visualized. Heart Rate: 125  bpm CRL:  11.4  mm   7 w   2 d                  US EDC: 10/03/2016 Subchorionic hemorrhage:  None visualized. Maternal uterus/adnexae: There is a 2.2 x 2.7 x 2.5 cm complex cyst within the right ovary. Normal left ovary. No free fluid in the pelvis. IMPRESSION: Single live intrauterine gestation.  No subchorionic hemorrhage. Electronically Signed   By: Annia Beltrew  Davis M.D.   On: 02/17/2016 21:16   [place] Ob Transvaginal  Result Date: 02/17/2016 CLINICAL DATA:  Pregnant patient with vaginal bleeding. EXAM:  OBSTETRIC <14 WK US AND TRANSVAGINAL OB US TECHNIQUE: Both transabdominal and transvaginal ultrasound examinations were performed for complete evaluation of the gestation as well as the maternal uterus, adnexal regions, and pelvic cul-de-sac. Transvaginal technique was performed to assess early pregnancy. COMPARISON:  None. FINDINGS: Intrauterine gestational sac: Single Yolk sac:  Visualized. Embryo:  Visualized. Cardiac Activity: Visualized.  Heart Rate: 125  bpm CRL:  11.4  mm   7 w   2 d                  [place] EDC: 10/03/2016 Subchorionic hemorrhage:  None visualized. Maternal uterus/adnexae: There is a 2.2 x 2.7 x 2.5 cm complex cyst within the right ovary. Normal left ovary. No free fluid in the pelvis. IMPRESSION: Single live intrauterine gestation.  No subchorionic hemorrhage. Electronically Signed   By: Annia Belt M.D.   On: 02/17/2016 21:16    MAU COURSE SSE Reviewed all labs performed on 1/27 - GC/CT normal, no significant drop in Hb TVUS - +fetus with cardiac activity, no [facility]  MDM Plan of care reviewed with patient, including labs and tests ordered and medical treatment.   ASSESSMENT 1. Vaginal bleeding in pregnancy, first trimester   2. Antepartum bleeding, first trimester     PLAN Discharge home in stable condition.   Follow-up Information    [facility]. Schedule an appointment as soon as possible for a visit in 1 week(s).   Specialty:  Obstetrics and Gynecology Why:  Initial OB appointment Contact information: [address] [place] [address] [phone]         Allergies as of 02/22/2016   No Known Allergies     Medication List    STOP taking these medications   ondansetron 4 MG disintegrating tablet Commonly known as:  ZOFRAN ODT     TAKE these medications   albuterol 108 (90 Base) MCG/ACT inhaler Commonly known as:  PROVENTIL HFA;VENTOLIN HFA Inhale 2 puffs into the lungs every 6 (six) hours as needed for  wheezing or shortness of breath.   beclomethasone 80 MCG/ACT inhaler Commonly known as:  QVAR Inhale 2 puffs into the lungs daily.   cetirizine 10 MG tablet Commonly known as:  ZYRTEC Take 10 mg by mouth daily.   hydrOXYzine 25 MG capsule Commonly known as:  VISTARIL Take 25 mg by mouth 3 (three) times daily as needed.   PRENATAL COMPLETE 14-0.4 MG Tabs Take 1 tablet by mouth daily.   promethazine 12.5 MG tablet Commonly known as:  PHENERGAN Take 1 tablet (12.5 mg total) by mouth every 6 (six) hours as needed for nausea or vomiting.        Jen Mow, DO OB Fellow 02/22/2016 3:25 PM

## 2016-02-22 NOTE — Discharge Instructions (Signed)
Vaginal Bleeding During Pregnancy, First Trimester °A small amount of bleeding (spotting) from the vagina is relatively common in early pregnancy. It usually stops on its own. Various things may cause bleeding or spotting in early pregnancy. Some bleeding may be related to the pregnancy, and some may not. In most cases, the bleeding is normal and is not a problem. However, bleeding can also be a sign of something serious. Be sure to tell your health care provider about any vaginal bleeding right away. °Some possible causes of vaginal bleeding during the first trimester include: °· Infection or inflammation of the cervix. °· Growths (polyps) on the cervix. °· Miscarriage or threatened miscarriage. °· Pregnancy tissue has developed outside of the uterus and in a fallopian tube (tubal pregnancy). °· Tiny cysts have developed in the uterus instead of pregnancy tissue (molar pregnancy). °Follow these instructions at home: °Watch your condition for any changes. The following actions may help to lessen any discomfort you are feeling: °· Follow your health care provider's instructions for limiting your activity. If your health care provider orders bed rest, you may need to stay in bed and only get up to use the bathroom. However, your health care provider may allow you to continue light activity. °· If needed, make plans for someone to help with your regular activities and responsibilities while you are on bed rest. °· Keep track of the number of pads you use each day, how often you change pads, and how soaked (saturated) they are. Write this down. °· Do not use tampons. Do not douche. °· Do not have sexual intercourse or orgasms until approved by your health care provider. °· If you pass any tissue from your vagina, save the tissue so you can show it to your health care provider. °· Only take over-the-counter or prescription medicines as directed by your health care provider. °· Do not take aspirin because it can make you  bleed. °· Keep all follow-up appointments as directed by your health care provider. °Contact a health care provider if: °· You have any vaginal bleeding during any part of your pregnancy. °· You have cramps or labor pains. °· You have a fever, not controlled by medicine. °Get help right away if: °· You have severe cramps in your back or belly (abdomen). °· You pass large clots or tissue from your vagina. °· Your bleeding increases. °· You feel light-headed or weak, or you have fainting episodes. °· You have chills. °· You are leaking fluid or have a gush of fluid from your vagina. °· You pass out while having a bowel movement. °This information is not intended to replace advice given to you by your health care provider. Make sure you discuss any questions you have with your health care provider. °Document Released: 10/17/2004 Document Revised: 06/15/2015 Document Reviewed: 09/14/2012 °Elsevier Interactive Patient Education © 2017 [employer] Inc[employer] ° °

## 2016-02-26 ENCOUNTER — Other Ambulatory Visit (HOSPITAL_COMMUNITY)
Admission: RE | Admit: 2016-02-26 | Discharge: 2016-02-26 | Disposition: A | Discharge status: Home / Self Care | Payer: Medicaid Other | Source: Ambulatory Visit | Attending: Family Medicine | Admitting: Family Medicine

## 2016-02-26 ENCOUNTER — Encounter: Payer: Self-pay | Admitting: Family Medicine

## 2016-02-26 ENCOUNTER — Ambulatory Visit (INDEPENDENT_AMBULATORY_CARE_PROVIDER_SITE_OTHER): Payer: Medicaid Other | Admitting: Clinical

## 2016-02-26 ENCOUNTER — Ambulatory Visit: Payer: Self-pay

## 2016-02-26 ENCOUNTER — Telehealth: Payer: Self-pay

## 2016-02-26 ENCOUNTER — Ambulatory Visit (INDEPENDENT_AMBULATORY_CARE_PROVIDER_SITE_OTHER): Payer: Medicaid Other | Admitting: Family Medicine

## 2016-02-26 VITALS — BP 106/71 | HR 75 | Wt 133.1 lb

## 2016-02-26 DIAGNOSIS — O3680X0 Pregnancy with inconclusive fetal viability, not applicable or unspecified: Secondary | ICD-10-CM

## 2016-02-26 DIAGNOSIS — Z01419 Encounter for gynecological examination (general) (routine) without abnormal findings: Secondary | ICD-10-CM | POA: Diagnosis present

## 2016-02-26 DIAGNOSIS — O34219 Maternal care for unspecified type scar from previous cesarean delivery: Secondary | ICD-10-CM | POA: Diagnosis not present

## 2016-02-26 DIAGNOSIS — O209 Hemorrhage in early pregnancy, unspecified: Secondary | ICD-10-CM | POA: Diagnosis not present

## 2016-02-26 DIAGNOSIS — Z124 Encounter for screening for malignant neoplasm of cervix: Secondary | ICD-10-CM

## 2016-02-26 DIAGNOSIS — Z87898 Personal history of other specified conditions: Secondary | ICD-10-CM | POA: Diagnosis not present

## 2016-02-26 DIAGNOSIS — Z113 Encounter for screening for infections with a predominantly sexual mode of transmission: Secondary | ICD-10-CM | POA: Insufficient documentation

## 2016-02-26 DIAGNOSIS — Z3689 Encounter for other specified antenatal screening: Secondary | ICD-10-CM

## 2016-02-26 DIAGNOSIS — Z8742 Personal history of other diseases of the female genital tract: Secondary | ICD-10-CM

## 2016-02-26 DIAGNOSIS — F4323 Adjustment disorder with mixed anxiety and depressed mood: Secondary | ICD-10-CM | POA: Diagnosis not present

## 2016-02-26 DIAGNOSIS — O10911 Unspecified pre-existing hypertension complicating pregnancy, first trimester: Secondary | ICD-10-CM | POA: Diagnosis not present

## 2016-02-26 DIAGNOSIS — Z1151 Encounter for screening for human papillomavirus (HPV): Secondary | ICD-10-CM

## 2016-02-26 DIAGNOSIS — O10919 Unspecified pre-existing hypertension complicating pregnancy, unspecified trimester: Secondary | ICD-10-CM

## 2016-02-26 DIAGNOSIS — Z98891 History of uterine scar from previous surgery: Secondary | ICD-10-CM

## 2016-02-26 DIAGNOSIS — O0990 Supervision of high risk pregnancy, unspecified, unspecified trimester: Secondary | ICD-10-CM

## 2016-02-26 LAB — COMPREHENSIVE METABOLIC PANEL
ALT: 6 U/L (ref 6–29)
AST: 11 U/L (ref 10–30)
Albumin: 4.4 g/dL (ref 3.6–5.1)
Alkaline Phosphatase: 44 U/L (ref 33–115)
BUN: 6 mg/dL — AB (ref 7–25)
CHLORIDE: 102 mmol/L (ref 98–110)
CO2: 27 mmol/L (ref 20–31)
CREATININE: 0.74 mg/dL (ref 0.50–1.10)
Calcium: 9.7 mg/dL (ref 8.6–10.2)
GLUCOSE: 79 mg/dL (ref 65–99)
Potassium: 3.8 mmol/L (ref 3.5–5.3)
SODIUM: 139 mmol/L (ref 135–146)
TOTAL PROTEIN: 7 g/dL (ref 6.1–8.1)
Total Bilirubin: 0.3 mg/dL (ref 0.2–1.2)

## 2016-02-26 NOTE — Progress Notes (Signed)
Pt informed that the ultrasound is considered a limited OB ultrasound and is not intended to be a complete ultrasound exam.  Patient also informed that the ultrasound is not being completed with the intent of assessing for fetal or placental anomalies or any pelvic abnormalities.  Explained that the purpose of today's ultrasound is to assess for viability.  Patient acknowledges the purpose of the exam and the limitations of the study.    Single IUP FHR = 154 per M-mode

## 2016-02-26 NOTE — Progress Notes (Signed)
  Subjective:    Selena Haynes is a [id number] 7235w3d being seen today for her first obstetrical visit.  Her obstetrical history is significant for HTN. Patient does intend to breast feed. Pregnancy history fully reviewed.  Patient reports vaginal bleeding - one pad a day. Has been seen in MAU - US shows viable IUP without subchorionic hematoma or perigestational bleed. Has some mild cramps.   Vitals:   02/26/16 1302  BP: 106/71  Pulse: 75  Weight: 133 lb 1.6 oz (60.4 kg)    HISTORY: OB History  Gravida Para Term Preterm AB Living  3 2 2     2   SAB TAB Ectopic Multiple Live Births          2    # Outcome Date GA Lbr Len/2nd Weight Sex Delivery Anes PTL Lv  3 Current           2 Term 08/30/09 3115w0d  7 lb 12 oz (3.515 kg) M CS-LTranv   LIV  1 Term 05/16/01 9115w0d  7 lb 14 oz (3.572 kg) F Vag-Spont   LIV     Past Medical History:  Diagnosis Date  . Hypertension    Past Surgical History:  Procedure Laterality Date  . CESAREAN SECTION     Family History  Problem Relation Age of Onset  . Asthma Sister   . Asthma Brother   . Diabetes Brother   . Hypertension Maternal Grandmother      Exam    Uterus:     Pelvic Exam:    Perineum: No Hemorrhoids, Normal Perineum   Vulva: normal, Bartholin's, Urethra, Skene's normal   Vagina:  normal mucosa   Cervix: no bleeding following Pap and no cervical motion tenderness   Adnexa: normal adnexa and no mass, fullness, tenderness   Bony Pelvis: gynecoid  System:     Skin: normal coloration and turgor, no rashes    Neurologic: gait normal; reflexes normal and symmetric   Extremities: normal strength, tone, and muscle mass   HEENT PERRLA and extra ocular movement intact   Mouth/Teeth mucous membranes moist, pharynx normal without lesions   Neck supple and no masses   Cardiovascular: regular rate and rhythm, no murmurs or gallops   Respiratory:  appears well, vitals normal, no respiratory distress, acyanotic, normal RR, ear and throat  exam is normal, neck free of mass or lymphadenopathy, chest clear, no wheezing, crepitations, rhonchi, normal symmetric air entry   Abdomen: soft, non-tender; bowel sounds normal; no masses,  no organomegaly   Urinary: urethral meatus normal      Assessment:    Pregnancy: [id number] Patient Active Problem List   Diagnosis Date Noted  . Supervision of high risk pregnancy, antepartum 02/26/2016  . Chronic hypertension affecting pregnancy 02/26/2016  . History of cesarean delivery 02/26/2016  . Vaginal bleeding in pregnancy, first trimester 02/22/2016        Plan:     Initial labs drawn. Prenatal vitamins. Problem list reviewed and updated. Genetic Screening discussed First Screen: ordered.  Ultrasound discussed; fetal survey: requested.  Follow up in 4 weeks. 100% of 30 min visit spent on counseling and coordination of care.   CMP and UP:C   Levie HeritageJacob J Stinson 02/26/2016

## 2016-02-26 NOTE — BH Specialist Note (Signed)
Session Start time: 1:35   End Time: 2:00 Total Time:  25 minutes Type of Service: Behavioral Health - Individual/Family Interpreter: No.   Interpreter Name & Language: n/a # [facility]  SUBJECTIVE: Selena AmberJennifer Haynes is a 31 y.o. female  Pt. was referred by Dr Adrian BlackwaterStinson for:  anxiety and depression. Pt. reports the following symptoms/concerns: Pt states that she is most concerned with feelings of anxiousness and inability to relax, "I think I'm OCD"; copes by cleaning and organizing. Pt says she often thinks that she doesn't deserved to relax, and that she can never do "enough". Duration of problem:  Over one month Severity: moderate Previous treatment: Yes, treated "about a year ago" for depression/anxiety, in [place]  OBJECTIVE: Mood: Anxious & Affect: Appropriate Risk of harm to self or others: No known risk of harm to self or others Assessments administered: PHQ9: 14/ GAD7: 10  LIFE CONTEXT:  Family & Social: Lives with 14yo daughter and 6yo son Product/process development scientistchool/ Work: Working Self-Care: Takes extra 10 minute break daily at work on phone games Life changes: Current pregnancy What is important to pt/family (values): Family  GOALS ADDRESSED:  -Reduce symptoms of anxiety and depression  INTERVENTIONS: Motivational Interviewing   ASSESSMENT:  Pt currently experiencing 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  Pt may benefit from psychoeducation and brief therapeutic intervention regarding coping with symptoms of anxiety and depression.   PLAN: 1. F/U with behavioral health clinician: One month (Discuss referral to psychiatry/therapy at that time) 2. Behavioral Health meds: none 3. Behavioral recommendations:  -Continue taking extra 10-minute work break daily; consider phone apps as additional self-care strategy -Consider taking warm bath at least one time in upcoming month -Read educational material regarding coping with symptoms of anxiety and depression 4.  Referral: Brief Counseling/Psychotherapy and Psychoeducation 5. From scale of 1-10, how likely are you to follow plan: 7  [facility]  Behavioral Health Clinician  Marlon PelWarmhandoff:   Warm Hand Off Completed.       Depression screen PHQ 2/9 02/26/2016  Decreased Interest 2  Down, Depressed, Hopeless 2  PHQ - 2 Score 4  Altered sleeping 3  Tired, decreased energy 1  Change in appetite 1  Feeling bad or failure about yourself  1  Trouble concentrating 2  Moving slowly or fidgety/restless 2  Suicidal thoughts 0  PHQ-9 Score 14   GAD 7 : Generalized Anxiety Score 02/26/2016  Nervous, Anxious, on Edge 1  Control/stop worrying 1  Worry too much - different things 1  Trouble relaxing 2  Restless 2  Easily annoyed or irritable 2  Afraid - awful might happen 1  Total GAD 7 Score 10

## 2016-02-26 NOTE — Progress Notes (Signed)
Declined Flu 

## 2016-02-27 LAB — CYTOLOGY - PAP
Chlamydia: NEGATIVE
Diagnosis: NEGATIVE
HPV (WINDOPATH): NOT DETECTED
Neisseria Gonorrhea: NEGATIVE

## 2016-02-27 LAB — PROTEIN / CREATININE RATIO, URINE
Creatinine, Urine: 115 mg/dL (ref 20–320)
PROTEIN CREATININE RATIO: 43 mg/g{creat} (ref 21–161)
TOTAL PROTEIN, URINE: 5 mg/dL (ref 5–24)

## 2016-02-27 LAB — URINE CULTURE: Organism ID, Bacteria: NO GROWTH

## 2016-02-27 NOTE — Addendum Note (Signed)
Addended by: Sherre LainASH, Yohannes Waibel A on: 02/27/2016 08:18 AM   Modules accepted: Orders

## 2016-02-28 LAB — HEMOGLOBINOPATHY EVALUATION
HEMATOCRIT: 38.5 % (ref 35.0–45.0)
HEMOGLOBIN: 13.1 g/dL (ref 11.7–15.5)
Hgb A2 Quant: 2.7 % (ref 1.8–3.5)
Hgb A: 96.3 % (ref >96.0)
Hgb F Quant: <1 % (ref <2.0)
MCH: 30.5 pg (ref 27.0–33.0)
MCV: 89.7 fL (ref 80.0–100.0)
RBC: 4.29 MIL/uL (ref 3.80–5.10)
RDW: 12.3 % (ref 11.0–15.0)

## 2016-02-29 ENCOUNTER — Encounter: Payer: Self-pay | Admitting: Family Medicine

## 2016-02-29 ENCOUNTER — Telehealth: Payer: Self-pay | Admitting: General Practice

## 2016-02-29 DIAGNOSIS — O99311 Alcohol use complicating pregnancy, first trimester: Secondary | ICD-10-CM | POA: Insufficient documentation

## 2016-02-29 DIAGNOSIS — F1290 Cannabis use, unspecified, uncomplicated: Secondary | ICD-10-CM | POA: Insufficient documentation

## 2016-02-29 LAB — PAIN MGMT, PROFILE 6 CONF W/O MM, U
6 Acetylmorphine: NEGATIVE ng/mL (ref <10)
AMPHETAMINES: NEGATIVE ng/mL (ref <500)
Alcohol Metabolites: POSITIVE ng/mL — AB (ref <500)
BARBITURATES: NEGATIVE ng/mL (ref <300)
BENZODIAZEPINES: NEGATIVE ng/mL (ref <100)
CREATININE: 98.5 mg/dL (ref >=20.0)
Cocaine Metabolite: NEGATIVE ng/mL (ref <150)
Ethyl Glucuronide (ETG): 57070 ng/mL — ABNORMAL HIGH (ref <500)
Ethyl Sulfate (ETS): 18102 ng/mL — ABNORMAL HIGH (ref <100)
MARIJUANA METABOLITE: 1293 ng/mL — AB (ref <5)
Marijuana Metabolite: POSITIVE ng/mL — AB (ref <20)
Methadone Metabolite: NEGATIVE ng/mL (ref <100)
OPIATES: NEGATIVE ng/mL (ref <100)
OXIDANT: NEGATIVE ug/mL (ref <200)
OXYCODONE: NEGATIVE ng/mL (ref <100)
PH: 7.1 (ref 4.5–9.0)
PHENCYCLIDINE: NEGATIVE ng/mL (ref <25)
Please note:: 0

## 2016-02-29 NOTE — Telephone Encounter (Signed)
Patient called and left message stating she needs a note for her dentist to do a routine cleaning. Called patient, no answer- left message to call us back as we are trying to return your phone call

## 2016-03-06 NOTE — Telephone Encounter (Signed)
Called pt and left message stating that we can provide the letter for dental care as requested. She may come to office any day during operating hours to obtain the letter.

## 2016-03-20 NOTE — Telephone Encounter (Signed)
Open in error

## 2016-03-25 ENCOUNTER — Ambulatory Visit (INDEPENDENT_AMBULATORY_CARE_PROVIDER_SITE_OTHER): Payer: Medicaid Other | Admitting: Family Medicine

## 2016-03-25 ENCOUNTER — Encounter: Payer: Self-pay | Admitting: General Practice

## 2016-03-25 VITALS — BP 103/61 | HR 82 | Wt 129.0 lb

## 2016-03-25 DIAGNOSIS — O10919 Unspecified pre-existing hypertension complicating pregnancy, unspecified trimester: Secondary | ICD-10-CM

## 2016-03-25 DIAGNOSIS — O10911 Unspecified pre-existing hypertension complicating pregnancy, first trimester: Secondary | ICD-10-CM | POA: Diagnosis not present

## 2016-03-25 DIAGNOSIS — M549 Dorsalgia, unspecified: Secondary | ICD-10-CM

## 2016-03-25 DIAGNOSIS — R634 Abnormal weight loss: Secondary | ICD-10-CM | POA: Diagnosis not present

## 2016-03-25 DIAGNOSIS — O9989 Other specified diseases and conditions complicating pregnancy, childbirth and the puerperium: Secondary | ICD-10-CM

## 2016-03-25 DIAGNOSIS — M9904 Segmental and somatic dysfunction of sacral region: Secondary | ICD-10-CM | POA: Diagnosis not present

## 2016-03-25 DIAGNOSIS — O99891 Other specified diseases and conditions complicating pregnancy: Secondary | ICD-10-CM

## 2016-03-25 DIAGNOSIS — M9903 Segmental and somatic dysfunction of lumbar region: Secondary | ICD-10-CM

## 2016-03-25 DIAGNOSIS — M9905 Segmental and somatic dysfunction of pelvic region: Secondary | ICD-10-CM

## 2016-03-25 DIAGNOSIS — O0990 Supervision of high risk pregnancy, unspecified, unspecified trimester: Secondary | ICD-10-CM

## 2016-03-25 DIAGNOSIS — O26891 Other specified pregnancy related conditions, first trimester: Secondary | ICD-10-CM

## 2016-03-25 MED ORDER — ALBUTEROL SULFATE HFA 108 (90 BASE) MCG/ACT IN AERS
2.0000 | INHALATION_SPRAY | Freq: Four times a day (QID) | RESPIRATORY_TRACT | 11 refills | Status: DC | PRN
Start: 2016-03-25 — End: 2017-07-14

## 2016-03-25 MED ORDER — PROMETHAZINE HCL 12.5 MG PO TABS: 12.5000 mg | ORAL_TABLET | Freq: Four times a day (QID) | ORAL | 0 refills | Status: DC | PRN

## 2016-03-25 MED ORDER — BECLOMETHASONE DIPROPIONATE 80 MCG/ACT IN AERS: 2.0000 | INHALATION_SPRAY | Freq: Every day | RESPIRATORY_TRACT | 11 refills | Status: DC

## 2016-03-25 NOTE — Progress Notes (Signed)
Patient requests refill on phenergan and albuterol inhaler

## 2016-03-25 NOTE — Progress Notes (Signed)
   PRENATAL VISIT NOTE  Subjective:  Selena Haynes is a 31 y.o. G3P2002 at 3737w3d being seen today for ongoing prenatal care.  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  Patient reports backache, started 2-3 days ago. Radiates down left leg to thigh. Cramping in nature. NO bleeding or spotting.  Contractions: Not present. Vag. Bleeding: None.  Movement: Absent. Denies leaking of fluid.   The following portions of the patient's history were reviewed and updated as appropriate: allergies, current medications, past family history, past medical history, past social history, past surgical history and problem list. Problem list updated.  Objective:   Vitals:   03/25/16 1448  BP: 103/61  Pulse: 82  Weight: 129 lb (58.5 kg)    Fetal Status: Fetal Heart Rate (bpm): 160   Movement: Absent     General:  Alert, oriented and cooperative. Patient is in no acute distress.  Skin: Skin is warm and dry. No rash noted.   Cardiovascular: Normal heart rate noted  Respiratory: Normal respiratory effort, no problems with respiration noted  Abdomen: Soft, gravid, appropriate for gestational age. Pain/Pressure: Present     Pelvic:  Cervical exam deferred        MSK: Restriction, tenderness, tissue texture changes, and paraspinal spasm in the lumbar spine  Neuro: Moves all four extremities with no focal neurological deficit  Extremities: Normal range of motion.  Edema: None  Mental Status: Normal mood and affect. Normal behavior. Normal judgment and thought content.   OSE: Head   Cervical   Thoracic   Rib   Lumbar  L5 ESRL  Sacrum  L/L torsion  Pelvis  Ant on right    Assessment and Plan:  Pregnancy:  G3P2002 at 3237w3d  1. Supervision of high risk pregnancy, antepartum FH normal  2. Chronic hypertension affecting pregnancy BP controlled  3. Unintended weight loss Continue antiemetic. Recommended ensure or boost between meals.  4. Back pain affecting pregnancy in first trimester 5. Somatic dysfunction of pelvis region 6. Somatic dysfunction of sacral region 7. Somatic dysfunction of lumbar region OMT done after patient permission. HVLA technique utilized. Patient tolerated procedure well.    Preterm labor symptoms and general obstetric precautions including but not limited to vaginal bleeding, contractions, leaking of fluid and fetal movement were reviewed in detail with the patient. Please refer to After Visit Summary for other counseling recommendations.  Return in about 4 weeks (around 04/22/2016) for OB f/u.  Levie HeritageJacob J Koda Defrank, DO

## 2016-03-29 ENCOUNTER — Encounter (HOSPITAL_COMMUNITY): Payer: Self-pay | Admitting: Family Medicine

## 2016-04-04 ENCOUNTER — Ambulatory Visit (HOSPITAL_COMMUNITY)
Admission: RE | Admit: 2016-04-04 | Discharge: 2016-04-04 | Disposition: A | Discharge status: Home / Self Care | Payer: Medicaid Other | Source: Ambulatory Visit | Attending: Family Medicine | Admitting: Family Medicine

## 2016-04-04 ENCOUNTER — Other Ambulatory Visit: Payer: Self-pay | Admitting: Family Medicine

## 2016-04-04 ENCOUNTER — Encounter (HOSPITAL_COMMUNITY): Payer: Self-pay

## 2016-04-04 ENCOUNTER — Other Ambulatory Visit: Payer: Self-pay

## 2016-04-04 DIAGNOSIS — O34219 Maternal care for unspecified type scar from previous cesarean delivery: Secondary | ICD-10-CM

## 2016-04-04 DIAGNOSIS — O34211 Maternal care for low transverse scar from previous cesarean delivery: Secondary | ICD-10-CM | POA: Insufficient documentation

## 2016-04-04 DIAGNOSIS — Z3682 Encounter for antenatal screening for nuchal translucency: Secondary | ICD-10-CM | POA: Diagnosis not present

## 2016-04-04 DIAGNOSIS — Z3A14 14 weeks gestation of pregnancy: Secondary | ICD-10-CM | POA: Insufficient documentation

## 2016-04-04 DIAGNOSIS — O10919 Unspecified pre-existing hypertension complicating pregnancy, unspecified trimester: Secondary | ICD-10-CM

## 2016-04-04 DIAGNOSIS — O10012 Pre-existing essential hypertension complicating pregnancy, second trimester: Secondary | ICD-10-CM | POA: Diagnosis not present

## 2016-04-04 DIAGNOSIS — O99321 Drug use complicating pregnancy, first trimester: Secondary | ICD-10-CM

## 2016-04-04 DIAGNOSIS — Z3A12 12 weeks gestation of pregnancy: Secondary | ICD-10-CM

## 2016-04-04 DIAGNOSIS — O0990 Supervision of high risk pregnancy, unspecified, unspecified trimester: Secondary | ICD-10-CM

## 2016-04-04 HISTORY — DX: Unspecified asthma, uncomplicated: J45.909

## 2016-04-22 ENCOUNTER — Emergency Department (HOSPITAL_COMMUNITY)
Admission: EM | Admit: 2016-04-22 | Discharge: 2016-04-22 | Disposition: A | Discharge status: Home / Self Care | Payer: Medicaid Other | Attending: Emergency Medicine | Admitting: Emergency Medicine

## 2016-04-22 ENCOUNTER — Emergency Department (HOSPITAL_COMMUNITY): Payer: Medicaid Other

## 2016-04-22 ENCOUNTER — Encounter (HOSPITAL_COMMUNITY): Payer: Self-pay

## 2016-04-22 DIAGNOSIS — O9989 Other specified diseases and conditions complicating pregnancy, childbirth and the puerperium: Secondary | ICD-10-CM | POA: Diagnosis present

## 2016-04-22 DIAGNOSIS — Z79899 Other long term (current) drug therapy: Secondary | ICD-10-CM | POA: Insufficient documentation

## 2016-04-22 DIAGNOSIS — O0281 Inappropriate change in quantitative human chorionic gonadotropin (hCG) in early pregnancy: Secondary | ICD-10-CM | POA: Insufficient documentation

## 2016-04-22 DIAGNOSIS — F17210 Nicotine dependence, cigarettes, uncomplicated: Secondary | ICD-10-CM | POA: Diagnosis not present

## 2016-04-22 DIAGNOSIS — Z3A15 15 weeks gestation of pregnancy: Secondary | ICD-10-CM | POA: Diagnosis not present

## 2016-04-22 DIAGNOSIS — H00034 Abscess of left upper eyelid: Secondary | ICD-10-CM | POA: Diagnosis not present

## 2016-04-22 DIAGNOSIS — J45909 Unspecified asthma, uncomplicated: Secondary | ICD-10-CM | POA: Insufficient documentation

## 2016-04-22 DIAGNOSIS — I10 Essential (primary) hypertension: Secondary | ICD-10-CM | POA: Insufficient documentation

## 2016-04-22 LAB — COMPREHENSIVE METABOLIC PANEL
ALBUMIN: 3.5 g/dL (ref 3.5–5.0)
ALK PHOS: 47 U/L (ref 38–126)
ALT: 15 U/L (ref 14–54)
ANION GAP: 10 (ref 5–15)
AST: 20 U/L (ref 15–41)
BILIRUBIN TOTAL: 0.2 mg/dL — AB (ref 0.3–1.2)
BUN: 6 mg/dL (ref 6–20)
CALCIUM: 9.4 mg/dL (ref 8.9–10.3)
CO2: 23 mmol/L (ref 22–32)
CREATININE: 0.65 mg/dL (ref 0.44–1.00)
Chloride: 103 mmol/L (ref 101–111)
GFR calc Af Amer: >60 mL/min (ref >60)
GFR calc non Af Amer: >60 mL/min (ref >60)
GLUCOSE: 135 mg/dL — AB (ref 65–99)
Potassium: 3.4 mmol/L — ABNORMAL LOW (ref 3.5–5.1)
Sodium: 136 mmol/L (ref 135–145)
TOTAL PROTEIN: 6.3 g/dL — AB (ref 6.5–8.1)

## 2016-04-22 LAB — HCG, QUANTITATIVE, PREGNANCY: HCG, BETA CHAIN, QUANT, S: 61271 m[IU]/mL — AB (ref <5)

## 2016-04-22 LAB — I-STAT CG4 LACTIC ACID, ED
LACTIC ACID, VENOUS: 1.5 mmol/L (ref 0.5–1.9)
Lactic Acid, Venous: 2.83 mmol/L (ref 0.5–1.9)

## 2016-04-22 LAB — CBC WITH DIFFERENTIAL/PLATELET
BASOS ABS: 0 10*3/uL (ref 0.0–0.1)
Basophils Relative: 0 %
EOS ABS: 0.2 10*3/uL (ref 0.0–0.7)
EOS PCT: 1 %
HEMATOCRIT: 33.2 % — AB (ref 36.0–46.0)
Hemoglobin: 11.4 g/dL — ABNORMAL LOW (ref 12.0–15.0)
Lymphocytes Relative: 14 %
Lymphs Abs: 1.6 10*3/uL (ref 0.7–4.0)
MCH: 29.2 pg (ref 26.0–34.0)
MCHC: 34.3 g/dL (ref 30.0–36.0)
MCV: 85.1 fL (ref 78.0–100.0)
MONO ABS: 0.7 10*3/uL (ref 0.1–1.0)
MONOS PCT: 6 %
NEUTROS ABS: 8.8 10*3/uL — AB (ref 1.7–7.7)
Neutrophils Relative %: 79 %
PLATELETS: 285 10*3/uL (ref 150–400)
RBC: 3.9 MIL/uL (ref 3.87–5.11)
RDW: 12.1 % (ref 11.5–15.5)
WBC: 11.2 10*3/uL — ABNORMAL HIGH (ref 4.0–10.5)

## 2016-04-22 MED ORDER — CLINDAMYCIN HCL 300 MG PO CAPS: 300.0000 mg | ORAL_CAPSULE | Freq: Three times a day (TID) | ORAL | 0 refills | Status: DC

## 2016-04-22 MED ORDER — IOPAMIDOL (ISOVUE-300) INJECTION 61%
INTRAVENOUS | Status: AC
  Administered 2016-04-22: 75 mL
  Filled 2016-04-22: qty 75

## 2016-04-22 MED ORDER — CLINDAMYCIN PHOSPHATE 900 MG/50ML IV SOLN
900.0000 mg | Freq: Once | INTRAVENOUS | Status: AC
  Administered 2016-04-22: 900 mg via INTRAVENOUS
  Filled 2016-04-22: qty 50

## 2016-04-22 NOTE — ED Triage Notes (Signed)
Pt states she was sent here by UC for swelling of the right eye. Possible orbital cellulitis. Pt denies injury to her eye. Pt also states she uses a nebulizer at home and is ou of her neb solutions.

## 2016-04-22 NOTE — ED Notes (Signed)
Patient transported to X-ray 

## 2016-04-22 NOTE — ED Notes (Signed)
Dr Ranae Palms Notified.

## 2016-04-22 NOTE — ED Notes (Signed)
MD [facility] notified of elevated lactic.

## 2016-04-22 NOTE — ED Notes (Signed)
Provider at bedside

## 2016-04-23 NOTE — ED Provider Notes (Signed)
[facility] Provider Note   CSN: [id number] Arrival date & time: 04/22/16  1039     History   Chief Complaint Chief Complaint  Patient presents with  . Facial Swelling  . Asthma    HPI Selena Haynes is a 31 y.o. female.  The history is provided by the patient.   Patient presents to the emergency department with complaints of right upper eyelid swelling and redness over the past several days with matting of her eyelids.  No change in her vision.  Mild pain with range of motion of her right eye superior gaze distribution.  She did not notice any styes earlier this week.  Symptoms are mild to moderate in severity.  Seen at the urgent care and sent to the emergency department for further evaluation.  Patient this time is moderate in severity with palpation of her right upper eyelid which is obviously swollen and red and edematous at this time.  Patient reports being [redacted] weeks pregnant with normal prenatal care thus far   Past Medical History:  Diagnosis Date  . Asthma   . Hypertension     Patient Active Problem List   Diagnosis Date Noted  . 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 Marijuana use 02/29/2016  . Supervision of high risk pregnancy, antepartum 02/26/2016  . Chronic hypertension affecting pregnancy 02/26/2016  . History of cesarean delivery 02/26/2016  . Vaginal bleeding in pregnancy, first trimester 02/22/2016    Past Surgical History:  Procedure Laterality Date  . CESAREAN SECTION      OB History    Gravida Para Term Preterm AB Living   SAB TAB Ectopic Multiple Live Births           2       Home Medications    Prior to Admission medications   Medication Sig Start Date End Date Taking? Authorizing Provider  albuterol (PROVENTIL HFA;VENTOLIN HFA) 108 (90 Base) MCG/ACT inhaler Inhale 2 puffs into the lungs every 6 (six) hours as needed for wheezing or shortness of breath. 03/25/16  Yes Rhona Raider Stinson, DO    beclomethasone (QVAR) 80 MCG/ACT inhaler Inhale 2 puffs into the lungs daily. 03/25/16  Yes Rhona Raider Stinson, DO  cetirizine (ZYRTEC) 10 MG tablet Take 10 mg by mouth daily.   Yes Historical Provider, MD  hydrOXYzine (VISTARIL) 25 MG capsule Take 25 mg by mouth 3 (three) times daily as needed.   Yes Historical Provider, MD  clindamycin (CLEOCIN) 300 MG capsule Take 1 capsule (300 mg total) by mouth 3 (three) times daily. 04/22/16   Azalia Bilis, MD  Prenatal Vit-Fe Fumarate-FA (PRENATAL COMPLETE) 14-0.4 MG TABS Take 1 tablet by mouth daily. Patient not taking: Reported on 04/22/2016 02/17/16   Garlon Hatchet, PA-C  promethazine (PHENERGAN) 12.5 MG tablet Take 1 tablet (12.5 mg total) by mouth every 6 (six) hours as needed for nausea or vomiting. Patient not taking: Reported on 04/22/2016 03/25/16   Levie Heritage, DO    Family History Family History  Problem Relation Age of Onset  . Asthma Sister   . Asthma Brother   . Diabetes Brother   . Hypertension Maternal Grandmother     Social History Social History  Substance Use Topics  . Smoking status: Current Every Day Smoker    Packs/day: 0.50    Types: Cigarettes  . Smokeless tobacco: Former Neurosurgeon  . Alcohol use No  Allergies   Patient has no known allergies.   Review of Systems Review of Systems  All other systems reviewed and are negative.    Physical Exam Updated Vital Signs BP 105/64   Pulse 83   Temp 98.7 F (37.1 C) (Oral)   Resp 16   Ht  (1.676 m)   Wt 132 lb (59.9 kg)   LMP 01/05/2016   SpO2 100%   BMI 21.31 kg/m   Physical Exam  Constitutional: She is oriented to person, place, and time. She appears well-developed and well-nourished.  HENT:  Head: Normocephalic.  Eyes:  Swelling of the right upper eyelid with erythema.  Right upper eyelid is edematous without obvious hordeolum or chalazion present.  There is a small amount of discharge noted at the upper lid margin.  Extraocular movements are intact but  there is some discomfort with superior gaze of the right eye.  Right pupils normal.  Right conjunctiva normal.  Neck: Normal range of motion.  Pulmonary/Chest: Effort normal.  Abdominal: She exhibits no distension.  Musculoskeletal: Normal range of motion.  Neurological: She is alert and oriented to person, place, and time.  Psychiatric: She has a normal mood and affect.  Nursing note and vitals reviewed.    ED Treatments / Results  Labs (all labs ordered are listed, but only abnormal results are displayed) Labs Reviewed  COMPREHENSIVE METABOLIC PANEL - Abnormal; Notable for the following:       Result Value   Potassium 3.4 (*)    Glucose, Bld 135 (*)    Total Protein 6.3 (*)    Total Bilirubin 0.2 (*)    All other components within normal limits  CBC WITH DIFFERENTIAL/PLATELET - Abnormal; Notable for the following:    WBC 11.2 (*)    Hemoglobin 11.4 (*)    HCT 33.2 (*)    Neutro Abs 8.8 (*)    All other components within normal limits  HCG, QUANTITATIVE, PREGNANCY - Abnormal; Notable for the following:    hCG, Beta Chain, Quant, S 61,271 (*)    All other components within normal limits  I-STAT CG4 LACTIC ACID, ED - Abnormal; Notable for the following:    Lactic Acid, Venous 2.83 (*)    All other components within normal limits  I-STAT CG4 LACTIC ACID, ED    EKG  EKG Interpretation None       Radiology Ct Orbits W Contrast  Result Date: 04/22/2016 CLINICAL DATA:  Right eye with pain. EXAM: CT ORBITS WITH CONTRAST TECHNIQUE: Multidetector CT images was performed according to the standard protocol following intravenous contrast administration. CONTRAST:  75mL ISOVUE-300 IOPAMIDOL (ISOVUE-300) INJECTION 61% COMPARISON:  None. FINDINGS: Orbits: --Globes: Normal. --Bony orbit: Normal. --Preseptal soft tissues: There is soft tissue swelling and mild inflammatory stranding of the right upper eyelid. Otherwise normal. --Intra- and extraconal orbital fat: Normal. No inflammatory  stranding. --Optic nerves: Normal. --Lacrimal glands and fossae: Normal. --Extraocular muscles: Normal. Visualized sinuses:  No fluid levels or advanced mucosal thickening. Soft tissues: Normal. Limited intracranial: Normal. IMPRESSION: Inflammation of the right upper eyelid, which may indicate cellulitis. No involvement of the orbital structures. Electronically Signed   By: Deatra Robinson M.D.   On: 04/22/2016 16:18    Procedures Procedures (including critical care time)  Medications Ordered in ED Medications  clindamycin (CLEOCIN) IVPB 900 mg (0 mg Intravenous Stopped 04/22/16 1459)  iopamidol (ISOVUE-300) 61 % injection (75 mLs  Contrast Given 04/22/16 1602)     Initial Impression / Assessment and  Plan / ED Course  I have reviewed the triage vital signs and the nursing notes.  Pertinent labs & imaging results that were available during my care of the patient were reviewed by me and considered in my medical decision making (see chart for details).   patiently treated for cellulitis of the right upper eyelid.  No obvious hordeolum or chalazion present.  Patient be placed on clindamycin.  Ophthalmology follow-up.  Patient understands return to the ER for new or worsening symptoms.  No involvement of the orbital structures noted on CT imaging  Final Clinical Impressions(s) / ED Diagnoses   Final diagnoses:  Cellulitis of left upper eyelid    New Prescriptions Discharge Medication List as of 04/22/2016  4:29 PM    START taking these medications   Details  clindamycin (CLEOCIN) 300 MG capsule Take 1 capsule (300 mg total) by mouth 3 (three) times daily., Starting Mon 04/22/2016, Print         Azalia Bilis, MD 04/23/16 [phone]

## 2016-04-25 ENCOUNTER — Ambulatory Visit: Payer: Medicaid Other | Admitting: Clinical

## 2016-04-25 ENCOUNTER — Ambulatory Visit (INDEPENDENT_AMBULATORY_CARE_PROVIDER_SITE_OTHER): Payer: Medicaid Other | Admitting: Family Medicine

## 2016-04-25 VITALS — BP 97/50 | HR 80 | Wt 130.3 lb

## 2016-04-25 DIAGNOSIS — O09522 Supervision of elderly multigravida, second trimester: Secondary | ICD-10-CM | POA: Diagnosis not present

## 2016-04-25 DIAGNOSIS — O10919 Unspecified pre-existing hypertension complicating pregnancy, unspecified trimester: Secondary | ICD-10-CM

## 2016-04-25 DIAGNOSIS — O10912 Unspecified pre-existing hypertension complicating pregnancy, second trimester: Secondary | ICD-10-CM | POA: Diagnosis not present

## 2016-04-25 NOTE — Progress Notes (Signed)
   PRENATAL VISIT NOTE  Subjective:  Selena Haynes is a 31 y.o. G3P2002 at [redacted]w[redacted]d being seen today for ongoing prenatal care.  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  Patient reports no complaints.  Contractions: Not present. Vag. Bleeding: None.  Movement: Present. Denies leaking of fluid.   The following portions of the patient's history were reviewed and updated as appropriate: allergies, current medications, past family history, past medical history, past social history, past surgical history and problem list. Problem list updated.  Objective:   Vitals:   04/25/16 1249  BP: (!) 97/50  Pulse: 80  Weight: 130 lb 4.8 oz (59.1 kg)    Fetal Status: Fetal Heart Rate (bpm): 148   Movement: Present     General:  Alert, oriented and cooperative. Patient is in no acute distress.  Skin: Skin is warm and dry. No rash noted.   Cardiovascular: Normal heart rate noted  Respiratory: Normal respiratory effort, no problems with respiration noted  Abdomen: Soft, gravid, appropriate for gestational age. Pain/Pressure: Absent     Pelvic:  Cervical exam deferred        Extremities: Normal range of motion.  Edema: None  Mental Status: Normal mood and affect. Normal behavior. Normal judgment and thought content.   Assessment and Plan:  Pregnancy: G3P2002 at [redacted]w[redacted]d  1. Supervision of high risk elderly multigravida in second trimester FHT and FH normal. - [place] MFM OB COMP + 14 WK; Future  2. Chronic hypertension affecting pregnancy BP controlled.  Preterm labor symptoms and general obstetric precautions including but not limited to vaginal bleeding, contractions, leaking of fluid and fetal movement  were reviewed in detail with the patient. Please refer to After Visit Summary for other counseling recommendations.  No Follow-up on file.   Levie Heritage, DO

## 2016-04-25 NOTE — BH Specialist Note (Signed)
Error

## 2016-05-16 ENCOUNTER — Other Ambulatory Visit: Payer: Self-pay | Admitting: Family Medicine

## 2016-05-16 ENCOUNTER — Ambulatory Visit (HOSPITAL_COMMUNITY)
Admission: RE | Admit: 2016-05-16 | Discharge: 2016-05-16 | Disposition: A | Discharge status: Home / Self Care | Payer: Medicaid Other | Source: Ambulatory Visit | Attending: Family Medicine | Admitting: Family Medicine

## 2016-05-16 ENCOUNTER — Encounter (HOSPITAL_COMMUNITY): Payer: Self-pay

## 2016-05-16 ENCOUNTER — Other Ambulatory Visit (HOSPITAL_COMMUNITY): Payer: Self-pay | Admitting: *Deleted

## 2016-05-16 DIAGNOSIS — Z369 Encounter for antenatal screening, unspecified: Secondary | ICD-10-CM

## 2016-05-16 DIAGNOSIS — O09522 Supervision of elderly multigravida, second trimester: Secondary | ICD-10-CM

## 2016-05-16 DIAGNOSIS — O10919 Unspecified pre-existing hypertension complicating pregnancy, unspecified trimester: Secondary | ICD-10-CM

## 2016-05-16 DIAGNOSIS — Z3A18 18 weeks gestation of pregnancy: Secondary | ICD-10-CM

## 2016-05-16 DIAGNOSIS — Z3A19 19 weeks gestation of pregnancy: Secondary | ICD-10-CM | POA: Diagnosis not present

## 2016-05-16 DIAGNOSIS — Z363 Encounter for antenatal screening for malformations: Secondary | ICD-10-CM | POA: Insufficient documentation

## 2016-05-16 DIAGNOSIS — O10012 Pre-existing essential hypertension complicating pregnancy, second trimester: Secondary | ICD-10-CM | POA: Diagnosis not present

## 2016-05-22 ENCOUNTER — Ambulatory Visit (INDEPENDENT_AMBULATORY_CARE_PROVIDER_SITE_OTHER): Payer: Medicaid Other | Admitting: Clinical

## 2016-05-22 ENCOUNTER — Ambulatory Visit (INDEPENDENT_AMBULATORY_CARE_PROVIDER_SITE_OTHER): Payer: Medicaid Other | Admitting: Obstetrics and Gynecology

## 2016-05-22 VITALS — BP 113/54 | HR 88 | Wt 138.6 lb

## 2016-05-22 DIAGNOSIS — Z98891 History of uterine scar from previous surgery: Secondary | ICD-10-CM

## 2016-05-22 DIAGNOSIS — O99312 Alcohol use complicating pregnancy, second trimester: Secondary | ICD-10-CM | POA: Diagnosis not present

## 2016-05-22 DIAGNOSIS — O99512 Diseases of the respiratory system complicating pregnancy, second trimester: Secondary | ICD-10-CM | POA: Diagnosis not present

## 2016-05-22 DIAGNOSIS — O10912 Unspecified pre-existing hypertension complicating pregnancy, second trimester: Secondary | ICD-10-CM | POA: Diagnosis not present

## 2016-05-22 DIAGNOSIS — G5601 Carpal tunnel syndrome, right upper limb: Secondary | ICD-10-CM | POA: Diagnosis not present

## 2016-05-22 DIAGNOSIS — O99322 Drug use complicating pregnancy, second trimester: Secondary | ICD-10-CM | POA: Diagnosis not present

## 2016-05-22 DIAGNOSIS — O0992 Supervision of high risk pregnancy, unspecified, second trimester: Secondary | ICD-10-CM

## 2016-05-22 DIAGNOSIS — J45909 Unspecified asthma, uncomplicated: Secondary | ICD-10-CM | POA: Diagnosis not present

## 2016-05-22 DIAGNOSIS — F419 Anxiety disorder, unspecified: Secondary | ICD-10-CM

## 2016-05-22 DIAGNOSIS — O0990 Supervision of high risk pregnancy, unspecified, unspecified trimester: Secondary | ICD-10-CM

## 2016-05-22 DIAGNOSIS — O99519 Diseases of the respiratory system complicating pregnancy, unspecified trimester: Secondary | ICD-10-CM

## 2016-05-22 DIAGNOSIS — O99340 Other mental disorders complicating pregnancy, unspecified trimester: Secondary | ICD-10-CM

## 2016-05-22 DIAGNOSIS — O99342 Other mental disorders complicating pregnancy, second trimester: Secondary | ICD-10-CM | POA: Diagnosis not present

## 2016-05-22 DIAGNOSIS — O10919 Unspecified pre-existing hypertension complicating pregnancy, unspecified trimester: Secondary | ICD-10-CM

## 2016-05-22 DIAGNOSIS — F4323 Adjustment disorder with mixed anxiety and depressed mood: Secondary | ICD-10-CM | POA: Diagnosis not present

## 2016-05-22 DIAGNOSIS — O99311 Alcohol use complicating pregnancy, first trimester: Secondary | ICD-10-CM

## 2016-05-22 MED ORDER — ASPIRIN EC 81 MG PO TBEC: 81.0000 mg | DELAYED_RELEASE_TABLET | Freq: Every day | ORAL | 10 refills | Status: DC

## 2016-05-22 NOTE — Progress Notes (Signed)
Patient states of having pain in and weakness, also numbness in right arm. Patient states it comes and go and that the pain is at a 7or 8.

## 2016-05-22 NOTE — BH Specialist Note (Signed)
Integrated Behavioral Health Follow Up Visit  MRN: [id number] Name: Selena Haynes   Session Start time: 3:50 Session End time: 4:50 Total time: 1 hour Number of Integrated Behavioral Health Clinician visits: 2/10  Type of Service: Integrated Behavioral Health- Individual/Family Interpretor:No. Interpretor Name and Language: n/a   Warm Hand Off Completed.       SUBJECTIVE: Selena Haynes is a 31 y.o. female accompanied by patient. Patient was referred by ff/u Dr Selena Haynes for anxiety and depression. Patient reports the following symptoms/concerns: Pt states her primary concern today is lack of boundaries within family dynamics; needs to talk out her feelings today. Duration of problem: Over three months; Severity of problem: moderate  OBJECTIVE: Mood: Anxious and Irritable and Affect: Appropriate Risk of harm to self or others: No plan to harm self or others   LIFE CONTEXT: Family and Social: Lives with 15yo daughter, 6yo son, and FOB(sometimes) School/Work: out-of-work since February Self-Care: Drives to have personal space for herself Life Changes: Current pregnancy  GOALS ADDRESSED: Patient will reduce symptoms of: agitation and anxiety and increase knowledge and/or ability of: coping skills and also: Increase healthy adjustment to current life circumstances and Increase adequate support systems for patient/family  INTERVENTIONS: Motivational Interviewing and Link to [employer] Standardized Assessments completed: GAD-7 and PHQ 9  ASSESSMENT: Patient currently experiencing Adjustment disorder with mixed anxiety and depressed mood. Patient may benefit from brief therapeutic intervention regarding coping with current feelings.  PLAN: 1. Follow up with behavioral health clinician on : One month 2. Behavioral recommendations:  -Consider [facility] family support groups/classes -Consider [facility] [place] for family counseling -Consider setting appropriate  boundaries with children -Consider eating one meal/week away from home, by self, with no guilt 3. Referral(s): Integrated Art gallery manager (In Clinic) and [employer]:  Family counseling, [facility] support 4. "From scale of 1-10, how likely are you to follow plan?": 7  Selena Haynes, LCSWA Depression screen [facility] 2/9 05/22/2016 04/25/2016 02/26/2016  Decreased Interest Down, Depressed, Hopeless PHQ - 2 Score Altered sleeping Tired, decreased energy Change in appetite 0 3 1  Feeling bad or failure about yourself  Trouble concentrating Moving slowly or fidgety/restless Suicidal thoughts 0 0 0  PHQ-9 Score GAD 7 : Generalized Anxiety Score 05/22/2016 04/25/2016 02/26/2016  Nervous, Anxious, on Edge Control/stop worrying Worry too much - different things Trouble relaxing Restless Easily annoyed or irritable Afraid - awful might happen Total GAD 7 Score 16 12 10

## 2016-05-22 NOTE — Progress Notes (Signed)
Prenatal Visit Note Date: 05/22/2016 Clinic: [facility]  Subjective:  Selena Haynes is a 31 y.o. G3P2002 at [redacted]w[redacted]d being seen today for ongoing prenatal care.  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  Patient reports carpal tunnel symptoms.   Contractions: Not present. Vag. Bleeding: None.  Movement: Present. Denies leaking of fluid.   The following portions of the patient's history were reviewed and updated as appropriate: allergies, current medications, past family history, past medical history, past social history, past surgical history and problem list. Problem list updated.  Objective:   Vitals:   05/22/16 1454  BP: (!) 113/54  Pulse: 88  Weight: 138 lb 9.6 oz (62.9 kg)    Fetal Status: Fetal Heart Rate (bpm): 140   Movement: Present     General:  Alert, oriented and cooperative. Patient is in no acute distress.  Skin: Skin is warm and dry. No rash noted.   Cardiovascular: Normal heart rate noted  Respiratory: Normal respiratory effort, no problems with respiration noted  Abdomen: Soft, gravid, appropriate for gestational age. Pain/Pressure: Absent     Pelvic:  Cervical exam deferred        Extremities: Normal range of motion.  Edema: Trace  Mental Status: Normal mood and affect. Normal behavior. Normal judgment and thought content.   Urinalysis:      Assessment and Plan:  Pregnancy: G3P2002 at [redacted]w[redacted]d  1. Maternal chronic hypertension in second trimester Normal BPs on no meds. Baseline TSH added for labs today.  - TSH  2. Carpal tunnel syndrome, right upper limb Patient states it  comes and goes and saw a neurologist at one point when [place] was [place] but didn't go to PT that they recommended. States she has sharp pains; not currently working. Patient has [place] medicaid and asked to call for evaluation and PRN referral.   3. Supervision of high risk pregnancy, antepartum Routine care. AFP today. Has completion anatomy u/s and growth u/s already scheduled  4. History of cesarean delivery D/w her re: delivery mode later in pregnancy  5. Substance abuse affecting pregnancy in second trimester, antepartum To meet with SW later today  6. Asthma Patient states she takes her qvar 2 puffs in AM and with the pollen using the albuterol almost daily.  Advised her to add singulair or zyrtec and two puffs qhs and that should only be using albuterol 2-4x/wk at the most. Pt told if not decrease in albuterol use to let us know and may have to adjust qvar dosing. She states that she takes aspirin fine  7. Anxiety On vistaril qhs prn   Preterm labor symptoms and general obstetric precautions including but not limited to vaginal bleeding, contractions, leaking of fluid and fetal movement were reviewed in detail with the patient. Please refer to After Visit Summary for other counseling recommendations.  Return in about 3 weeks (around 06/12/2016).   [place] Bing, MD

## 2016-05-23 ENCOUNTER — Other Ambulatory Visit: Payer: Self-pay | Admitting: Family Medicine

## 2016-05-30 LAB — AFP, SERUM, OPEN SPINA BIFIDA
AFP MoM: 1.41
AFP Value: 85.5 ng/mL
GEST. AGE ON COLLECTION DATE: 19.5 wk
Maternal Age At EDD: 33.4 yr
OSBR RISK 1 IN: 7002
TEST RESULTS AFP: NEGATIVE
Weight: 138 [lb_av]

## 2016-05-30 LAB — TSH: TSH: 1.81 u[IU]/mL (ref 0.450–4.500)

## 2016-06-12 ENCOUNTER — Ambulatory Visit (INDEPENDENT_AMBULATORY_CARE_PROVIDER_SITE_OTHER): Payer: Medicaid Other | Admitting: Obstetrics and Gynecology

## 2016-06-12 VITALS — BP 113/53 | HR 100 | Wt 147.0 lb

## 2016-06-12 DIAGNOSIS — F419 Anxiety disorder, unspecified: Secondary | ICD-10-CM

## 2016-06-12 DIAGNOSIS — O99342 Other mental disorders complicating pregnancy, second trimester: Secondary | ICD-10-CM

## 2016-06-12 DIAGNOSIS — O99340 Other mental disorders complicating pregnancy, unspecified trimester: Secondary | ICD-10-CM

## 2016-06-12 DIAGNOSIS — O99519 Diseases of the respiratory system complicating pregnancy, unspecified trimester: Secondary | ICD-10-CM

## 2016-06-12 DIAGNOSIS — O99512 Diseases of the respiratory system complicating pregnancy, second trimester: Secondary | ICD-10-CM

## 2016-06-12 DIAGNOSIS — O10919 Unspecified pre-existing hypertension complicating pregnancy, unspecified trimester: Secondary | ICD-10-CM

## 2016-06-12 DIAGNOSIS — J45909 Unspecified asthma, uncomplicated: Secondary | ICD-10-CM | POA: Diagnosis not present

## 2016-06-12 DIAGNOSIS — O99322 Drug use complicating pregnancy, second trimester: Secondary | ICD-10-CM | POA: Diagnosis not present

## 2016-06-12 DIAGNOSIS — O34219 Maternal care for unspecified type scar from previous cesarean delivery: Secondary | ICD-10-CM

## 2016-06-12 DIAGNOSIS — O10912 Unspecified pre-existing hypertension complicating pregnancy, second trimester: Secondary | ICD-10-CM | POA: Diagnosis not present

## 2016-06-12 DIAGNOSIS — O0992 Supervision of high risk pregnancy, unspecified, second trimester: Secondary | ICD-10-CM

## 2016-06-12 DIAGNOSIS — O0990 Supervision of high risk pregnancy, unspecified, unspecified trimester: Secondary | ICD-10-CM

## 2016-06-12 DIAGNOSIS — Z98891 History of uterine scar from previous surgery: Secondary | ICD-10-CM

## 2016-06-12 NOTE — Progress Notes (Signed)
Prenatal Visit Note Date: 06/12/2016 Clinic: [facility]  Subjective:  Selena Haynes is a 31 y.o. G3P2002 at [redacted]w[redacted]d being seen today for ongoing prenatal care.  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  Patient reports still having to use the albuterol a few times per day.  Contractions: Not present.  .  Movement: Present. Denies leaking of fluid.   The following portions of the patient's history were reviewed and updated as appropriate: allergies, current medications, past family history, past medical history, past social history, past surgical history and problem list. Problem list updated.  Objective:   Vitals:   06/12/16 1053  BP: (!) 113/53  Pulse: 100  Weight: 147 lb (66.7 kg)    Fetal Status: Fetal Heart Rate (bpm): 145   Movement: Present     General:  Alert, oriented and cooperative. Patient is in no acute distress.  Skin: Skin is warm and dry. No rash noted.   Cardiovascular: Normal heart rate noted  Respiratory: Normal respiratory effort, no problems with respiration noted CTAB  Abdomen: Soft, gravid, appropriate for gestational age. Pain/Pressure: Absent     Pelvic:  Cervical exam deferred        Extremities: Normal range of motion.     Mental Status: Normal mood and affect. Normal behavior. Normal judgment and thought content.   Urinalysis:      Assessment and Plan:  Pregnancy: G3P2002 at [redacted]w[redacted]d  1. Supervision of high risk pregnancy, antepartum F/u completion anatomy scan in early june  2.  Substance abuse affecting pregnancy in second trimester, antepartum No current issues. Will continue to follow  3. History of cesarean delivery D/w pt later in pregnancy  4. Anxiety disorder affecting pregnancy, antepartum Doing well. No issues.   5. Chronic hypertension affecting pregnancy Doing well on no medications  6. Asthma affecting pregnancy, antepartum Pt unsure of qvar and has added singulair. Pt unsure of PCP that was prescribing the qvar. I told her to give them a call to be seen this week and to call us with the qvar dose she's on if they won't see her b/c she's pregnant. In the interim, I told her to do qvar 3 puffs bid from 2 puffs bid.   Preterm labor symptoms and general obstetric precautions including but not limited to vaginal bleeding, contractions, leaking of fluid and fetal movement were reviewed in detail with the patient. Please refer to After Visit Summary for other counseling recommendations.  Return in about 2 weeks (around 06/26/2016).   [place] BingPickens, Sumedh Shinsato, MD

## 2016-06-26 ENCOUNTER — Ambulatory Visit (INDEPENDENT_AMBULATORY_CARE_PROVIDER_SITE_OTHER): Payer: Medicaid Other | Admitting: Obstetrics and Gynecology

## 2016-06-26 VITALS — BP 111/71 | HR 97 | Wt 153.0 lb

## 2016-06-26 DIAGNOSIS — O99312 Alcohol use complicating pregnancy, second trimester: Secondary | ICD-10-CM

## 2016-06-26 DIAGNOSIS — Z98891 History of uterine scar from previous surgery: Secondary | ICD-10-CM

## 2016-06-26 DIAGNOSIS — O34219 Maternal care for unspecified type scar from previous cesarean delivery: Secondary | ICD-10-CM

## 2016-06-26 DIAGNOSIS — O0992 Supervision of high risk pregnancy, unspecified, second trimester: Secondary | ICD-10-CM | POA: Diagnosis not present

## 2016-06-26 DIAGNOSIS — O0990 Supervision of high risk pregnancy, unspecified, unspecified trimester: Secondary | ICD-10-CM

## 2016-06-26 DIAGNOSIS — O99311 Alcohol use complicating pregnancy, first trimester: Secondary | ICD-10-CM

## 2016-06-26 DIAGNOSIS — O10919 Unspecified pre-existing hypertension complicating pregnancy, unspecified trimester: Secondary | ICD-10-CM

## 2016-06-26 DIAGNOSIS — O10912 Unspecified pre-existing hypertension complicating pregnancy, second trimester: Secondary | ICD-10-CM | POA: Diagnosis not present

## 2016-06-26 NOTE — Progress Notes (Signed)
   PRENATAL VISIT NOTE  Subjective:  Selena Haynes is a 31 y.o. G3P2002 at 6741w5d being seen today for ongoing prenatal care.  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  Patient reports no complaints.   .  .  Movement: Present. Denies leaking of fluid.   The following portions of the patient's history were reviewed and updated as appropriate: allergies, current medications, past family history, past medical history, past social history, past surgical history and problem list. Problem list updated.  Objective:   Vitals:   06/26/16 1249  BP: 111/71  Pulse: 97  Weight: 153 lb (69.4 kg)    Fetal Status: Fetal Heart Rate (bpm): 141   Movement: Present     General:  Alert, oriented and cooperative. Patient is in no acute distress.  Skin: Skin is warm and dry. No rash noted.   Cardiovascular: Normal heart rate noted  Respiratory: Normal respiratory effort, no problems with respiration noted  Abdomen: Soft, gravid, appropriate for gestational age. Pain/Pressure: Absent     Pelvic:  Cervical exam deferred        Extremities: Normal range of motion.     Mental Status: Normal mood and affect. Normal behavior. Normal judgment and thought content.   Assessment and Plan:  Pregnancy: G3P2002 at 6941w5d  1. Chronic hypertension affecting pregnancy Well controlled on no medication Continue ASA  2. Supervision of high risk pregnancy, antepartum Patient is doing well without complaints Follow up anatomy ultrasound on 6/7  3. Alcohol use affecting  pregnancy in first trimester  4. History of cesarean delivery Will discuss delivery options at next visit  Preterm labor symptoms and general obstetric precautions including but not limited to vaginal bleeding, contractions, leaking of fluid and fetal movement were reviewed in detail with the patient. Please refer to After Visit Summary for other counseling recommendations.  Return in about 3 weeks (around 07/17/2016) for ROB, 2 hr glucola next visit.   Catalina AntiguaPeggy Cayle Thunder, MD

## 2016-06-27 ENCOUNTER — Encounter (HOSPITAL_COMMUNITY): Payer: Self-pay

## 2016-06-27 ENCOUNTER — Other Ambulatory Visit (HOSPITAL_COMMUNITY): Payer: Self-pay | Admitting: *Deleted

## 2016-06-27 ENCOUNTER — Ambulatory Visit (HOSPITAL_COMMUNITY)
Admission: RE | Admit: 2016-06-27 | Discharge: 2016-06-27 | Disposition: A | Discharge status: Home / Self Care | Payer: Medicaid Other | Source: Ambulatory Visit | Attending: Family Medicine | Admitting: Family Medicine

## 2016-06-27 ENCOUNTER — Other Ambulatory Visit (HOSPITAL_COMMUNITY): Payer: Self-pay | Admitting: Maternal & Fetal Medicine

## 2016-06-27 DIAGNOSIS — O34219 Maternal care for unspecified type scar from previous cesarean delivery: Secondary | ICD-10-CM | POA: Insufficient documentation

## 2016-06-27 DIAGNOSIS — O10012 Pre-existing essential hypertension complicating pregnancy, second trimester: Secondary | ICD-10-CM | POA: Diagnosis not present

## 2016-06-27 DIAGNOSIS — O99322 Drug use complicating pregnancy, second trimester: Secondary | ICD-10-CM | POA: Insufficient documentation

## 2016-06-27 DIAGNOSIS — O10912 Unspecified pre-existing hypertension complicating pregnancy, second trimester: Secondary | ICD-10-CM | POA: Diagnosis present

## 2016-06-27 DIAGNOSIS — O10919 Unspecified pre-existing hypertension complicating pregnancy, unspecified trimester: Secondary | ICD-10-CM

## 2016-06-27 DIAGNOSIS — Z3A24 24 weeks gestation of pregnancy: Secondary | ICD-10-CM | POA: Insufficient documentation

## 2016-06-27 DIAGNOSIS — Z362 Encounter for other antenatal screening follow-up: Secondary | ICD-10-CM | POA: Insufficient documentation

## 2016-07-15 ENCOUNTER — Ambulatory Visit (INDEPENDENT_AMBULATORY_CARE_PROVIDER_SITE_OTHER): Payer: Medicaid Other | Admitting: Family Medicine

## 2016-07-15 ENCOUNTER — Encounter: Payer: Self-pay | Admitting: Family Medicine

## 2016-07-15 VITALS — BP 97/45 | HR 102 | Wt 158.7 lb

## 2016-07-15 DIAGNOSIS — O0992 Supervision of high risk pregnancy, unspecified, second trimester: Secondary | ICD-10-CM | POA: Diagnosis not present

## 2016-07-15 DIAGNOSIS — G5600 Carpal tunnel syndrome, unspecified upper limb: Secondary | ICD-10-CM | POA: Diagnosis not present

## 2016-07-15 DIAGNOSIS — O10919 Unspecified pre-existing hypertension complicating pregnancy, unspecified trimester: Secondary | ICD-10-CM

## 2016-07-15 DIAGNOSIS — O34219 Maternal care for unspecified type scar from previous cesarean delivery: Secondary | ICD-10-CM

## 2016-07-15 DIAGNOSIS — O26892 Other specified pregnancy related conditions, second trimester: Secondary | ICD-10-CM | POA: Diagnosis not present

## 2016-07-15 DIAGNOSIS — O219 Vomiting of pregnancy, unspecified: Secondary | ICD-10-CM

## 2016-07-15 DIAGNOSIS — Z23 Encounter for immunization: Secondary | ICD-10-CM | POA: Diagnosis not present

## 2016-07-15 DIAGNOSIS — O10912 Unspecified pre-existing hypertension complicating pregnancy, second trimester: Secondary | ICD-10-CM | POA: Diagnosis present

## 2016-07-15 DIAGNOSIS — O0990 Supervision of high risk pregnancy, unspecified, unspecified trimester: Secondary | ICD-10-CM

## 2016-07-15 DIAGNOSIS — O26899 Other specified pregnancy related conditions, unspecified trimester: Secondary | ICD-10-CM

## 2016-07-15 LAB — POCT URINALYSIS DIP (DEVICE)
BILIRUBIN URINE: NEGATIVE
Glucose, UA: NEGATIVE mg/dL
Hgb urine dipstick: NEGATIVE
Ketones, ur: NEGATIVE mg/dL
LEUKOCYTES UA: NEGATIVE
NITRITE: NEGATIVE
Protein, ur: NEGATIVE mg/dL
SPECIFIC GRAVITY, URINE: 1.02 (ref 1.005–1.030)
UROBILINOGEN UA: 0.2 mg/dL (ref 0.0–1.0)
pH: 7 (ref 5.0–8.0)

## 2016-07-15 MED ORDER — WRIST SPLINT/COCK-UP/LEFT M MISC: 1.0000 [IU] | Freq: Every day | 0 refills | Status: DC

## 2016-07-15 MED ORDER — WRIST SPLINT/COCK-UP/RIGHT M MISC: 1.0000 [IU] | Freq: Every day | 0 refills | Status: DC

## 2016-07-15 MED ORDER — PROMETHAZINE HCL 12.5 MG PO TABS: 12.5000 mg | ORAL_TABLET | Freq: Four times a day (QID) | ORAL | 2 refills | Status: DC | PRN

## 2016-07-15 NOTE — Progress Notes (Signed)
   PRENATAL VISIT NOTE  Subjective:  Selena Haynes is a 31 y.o. G3P2002 at [redacted]w[redacted]d being seen today for ongoing prenatal care.  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  Patient reports carpal tunnel symptoms and right leg numbness and difficulty bending her knee.  Contractions: Irritability. Vag. Bleeding: None.  Movement: Present. Denies leaking of fluid.   The following portions of the patient's history were reviewed and updated as appropriate: allergies, current medications, past family history, past medical history, past social history, past surgical history and problem list. Problem list updated.  Objective:   Vitals:   07/15/16 0752  BP: (!) 97/45  Pulse: (!) 102  Weight: 158 lb 11.2 oz (72 kg)    Fetal Status: Fetal Heart Rate (bpm): 147 Fundal Height: 27 cm Movement: Present     General:  Alert, oriented and cooperative. Patient is in no acute distress.  Skin: Skin is warm and dry. No rash noted.   Cardiovascular: Normal heart rate noted  Respiratory: Normal respiratory effort, no problems with respiration noted  Abdomen: Soft, gravid, appropriate for gestational age. Pain/Pressure: Absent     Pelvic:  Cervical exam deferred        Extremities: Normal range of motion.  Edema: Trace + Tinnel's sign  Mental Status: Normal mood and affect. Normal behavior. Normal judgment and thought content.   Assessment and Plan:  Pregnancy: G3P2002 at [redacted]w[redacted]d  1. Chronic hypertension affecting pregnancy BP is well controlled on no  meds. Infant is appropriately grown Has f/u scheduled 7/5 - POCT urinalysis dip (device)  2. Supervision of high risk pregnancy, antepartum 28 wk labs and prenatal profile completion TDaP today - Glucose Tolerance, 2 Hours w/1 Hour - RPR - CBC - HIV antibody (with reflex) - Tdap vaccine greater than or equal to 7yo IM - Antibody screen - Hepatitis B surface antigen - Rubella screen  3. Carpal tunnel syndrome during pregnancy Trial of wrist splints - Elastic Bandages & Supports (WRIST SPLINT/COCK-UP/LEFT M) MISC; 1 Units by Does not apply route at bedtime.  Dispense: 1 each; Refill: 0 - Elastic Bandages & Supports (WRIST SPLINT/COCK-UP/RIGHT M) MISC; 1 Units by Does not apply route at bedtime.  Dispense: 1 each; Refill: 0  4. Previous Cesarean Section TOLAC vs. RCS discussed at length today, op note reviewed. Infant was OP Desires TOLAC--consent signed.  5. Sciatica, right Maternity band Consider PT if no relief  Preterm labor symptoms and general obstetric precautions including but not limited to vaginal bleeding, contractions, leaking of fluid and fetal movement were reviewed in detail with the patient. Please refer to After Visit Summary for other counseling recommendations.  Return in 2 weeks (on 07/29/2016).   Reva Boresanya S Aimee Heldman, MD

## 2016-07-15 NOTE — Patient Instructions (Signed)
 Third Trimester of Pregnancy The third trimester is from week 28 through week 40 (months 7 through 9). The third trimester is a time when the unborn baby (fetus) is growing rapidly. At the end of the ninth month, the fetus is about 20 inches in length and weighs 6-10 pounds. Body changes during your third trimester Your body will continue to go through many changes during pregnancy. The changes vary from woman to woman. During the third trimester:  Your weight will continue to increase. You can expect to gain 25-35 pounds (11-16 kg) by the end of the pregnancy.  You may begin to get stretch marks on your hips, abdomen, and breasts.  You may urinate more often because the fetus is moving lower into your pelvis and pressing on your bladder.  You may develop or continue to have heartburn. This is caused by increased hormones that slow down muscles in the digestive tract.  You may develop or continue to have constipation because increased hormones slow digestion and cause the muscles that push waste through your intestines to relax.  You may develop hemorrhoids. These are swollen veins (varicose veins) in the rectum that can itch or be painful.  You may develop swollen, bulging veins (varicose veins) in your legs.  You may have increased body aches in the pelvis, back, or thighs. This is due to weight gain and increased hormones that are relaxing your joints.  You may have changes in your hair. These can include thickening of your hair, rapid growth, and changes in texture. Some women also have hair loss during or after pregnancy, or hair that feels dry or thin. Your hair will most likely return to normal after your baby is born.  Your breasts will continue to grow and they will continue to become tender. A yellow fluid (colostrum) may leak from your breasts. This is the first milk you are producing for your baby.  Your belly button may stick out.  You may notice more swelling in your  hands, face, or ankles.  You may have increased tingling or numbness in your hands, arms, and legs. The skin on your belly may also feel numb.  You may feel short of breath because of your expanding uterus.  You may have more problems sleeping. This can be caused by the size of your belly, increased need to urinate, and an increase in your body's metabolism.  You may notice the fetus "dropping," or moving lower in your abdomen (lightening).  You may have increased vaginal discharge.  You may notice your joints feel loose and you may have pain around your pelvic bone.  What to expect at prenatal visits You will have prenatal exams every 2 weeks until week 36. Then you will have weekly prenatal exams. During a routine prenatal visit:  You will be weighed to make sure you and the baby are growing normally.  Your blood pressure will be taken.  Your abdomen will be measured to track your baby's growth.  The fetal heartbeat will be listened to.  Any test results from the previous visit will be discussed.  You may have a cervical check near your due date to see if your cervix has softened or thinned (effaced).  You will be tested for Group B streptococcus. This happens between 35 and 37 weeks.  Your health care provider may ask you:  What your birth plan is.  How you are feeling.  If you are feeling the baby move.  If you have   had any abnormal symptoms, such as leaking fluid, bleeding, severe headaches, or abdominal cramping.  If you are using any tobacco products, including cigarettes, chewing tobacco, and electronic cigarettes.  If you have any questions.  Other tests or screenings that may be performed during your third trimester include:  Blood tests that check for low iron levels (anemia).  Fetal testing to check the health, activity level, and growth of the fetus. Testing is done if you have certain medical conditions or if there are problems during the  pregnancy.  Nonstress test (NST). This test checks the health of your baby to make sure there are no signs of problems, such as the baby not getting enough oxygen. During this test, a belt is placed around your belly. The baby is made to move, and its heart rate is monitored during movement.  What is false labor? False labor is a condition in which you feel small, irregular tightenings of the muscles in the womb (contractions) that usually go away with rest, changing position, or drinking water. These are called Braxton Hicks contractions. Contractions may last for hours, days, or even weeks before true labor sets in. If contractions come at regular intervals, become more frequent, increase in intensity, or become painful, you should see your health care provider. What are the signs of labor?  Abdominal cramps.  Regular contractions that start at 10 minutes apart and become stronger and more frequent with time.  Contractions that start on the top of the uterus and spread down to the lower abdomen and back.  Increased pelvic pressure and dull back pain.  A watery or bloody mucus discharge that comes from the vagina.  Leaking of amniotic fluid. This is also known as your "water breaking." It could be a slow trickle or a gush. Let your health care provider know if it has a color or strange odor. If you have any of these signs, call your health care provider right away, even if it is before your due date. Follow these instructions at home: Medicines  Follow your health care provider's instructions regarding medicine use. Specific medicines may be either safe or unsafe to take during pregnancy.  Take a prenatal vitamin that contains at least 600 micrograms (mcg) of folic acid.  If you develop constipation, try taking a stool softener if your health care provider approves. Eating and drinking  Eat a balanced diet that includes fresh fruits and vegetables, whole grains, good sources of protein  such as meat, eggs, or tofu, and low-fat dairy. Your health care provider will help you determine the amount of weight gain that is right for you.  Avoid raw meat and uncooked cheese. These carry germs that can cause birth defects in the baby.  If you have low calcium intake from food, talk to your health care provider about whether you should take a daily calcium supplement.  Eat four or five small meals rather than three large meals a day.  Limit foods that are high in fat and processed sugars, such as fried and sweet foods.  To prevent constipation: ? Drink enough fluid to keep your urine clear or pale yellow. ? Eat foods that are high in fiber, such as fresh fruits and vegetables, whole grains, and beans. Activity  Exercise only as directed by your health care provider. Most women can continue their usual exercise routine during pregnancy. Try to exercise for 30 minutes at least 5 days a week. Stop exercising if you experience uterine contractions.  Avoid   heavy lifting.  Do not exercise in extreme heat or humidity, or at high altitudes.  Wear low-heel, comfortable shoes.  Practice good posture.  You may continue to have sex unless your health care provider tells you otherwise. Relieving pain and discomfort  Take frequent breaks and rest with your legs elevated if you have leg cramps or low back pain.  Take warm sitz baths to soothe any pain or discomfort caused by hemorrhoids. Use hemorrhoid cream if your health care provider approves.  Wear a good support bra to prevent discomfort from breast tenderness.  If you develop varicose veins: ? Wear support pantyhose or compression stockings as told by your healthcare provider. ? Elevate your feet for 15 minutes, 3-4 times a day. Prenatal care  Write down your questions. Take them to your prenatal visits.  Keep all your prenatal visits as told by your health care provider. This is important. Safety  Wear your seat belt at  all times when driving.  Make a list of emergency phone numbers, including numbers for family, friends, the hospital, and police and fire departments. General instructions  Avoid cat litter boxes and soil used by cats. These carry germs that can cause birth defects in the baby. If you have a cat, ask someone to clean the litter box for you.  Do not travel far distances unless it is absolutely necessary and only with the approval of your health care provider.  Do not use hot tubs, steam rooms, or saunas.  Do not drink alcohol.  Do not use any products that contain nicotine or tobacco, such as cigarettes and e-cigarettes. If you need help quitting, ask your health care provider.  Do not use any medicinal herbs or unprescribed drugs. These chemicals affect the formation and growth of the baby.  Do not douche or use tampons or scented sanitary pads.  Do not cross your legs for long periods of time.  To prepare for the arrival of your baby: ? Take prenatal classes to understand, practice, and ask questions about labor and delivery. ? Make a trial run to the hospital. ? Visit the hospital and tour the maternity area. ? Arrange for maternity or paternity leave through employers. ? Arrange for family and friends to take care of pets while you are in the hospital. ? Purchase a rear-facing car seat and make sure you know how to install it in your car. ? Pack your hospital bag. ? Prepare the baby's nursery. Make sure to remove all pillows and stuffed animals from the baby's crib to prevent suffocation.  Visit your dentist if you have not gone during your pregnancy. Use a soft toothbrush to brush your teeth and be gentle when you floss. Contact a health care provider if:  You are unsure if you are in labor or if your water has broken.  You become dizzy.  You have mild pelvic cramps, pelvic pressure, or nagging pain in your abdominal area.  You have lower back pain.  You have persistent  nausea, vomiting, or diarrhea.  You have an unusual or bad smelling vaginal discharge.  You have pain when you urinate. Get help right away if:  Your water breaks before 37 weeks.  You have regular contractions less than 5 minutes apart before 37 weeks.  You have a fever.  You are leaking fluid from your vagina.  You have spotting or bleeding from your vagina.  You have severe abdominal pain or cramping.  You have rapid weight loss or weight   gain.  You have shortness of breath with chest pain.  You notice sudden or extreme swelling of your face, hands, ankles, feet, or legs.  Your baby makes fewer than 10 movements in 2 hours.  You have severe headaches that do not go away when you take medicine.  You have vision changes. Summary  The third trimester is from week 28 through week 40, months 7 through 9. The third trimester is a time when the unborn baby (fetus) is growing rapidly.  During the third trimester, your discomfort may increase as you and your baby continue to gain weight. You may have abdominal, leg, and back pain, sleeping problems, and an increased need to urinate.  During the third trimester your breasts will keep growing and they will continue to become tender. A yellow fluid (colostrum) may leak from your breasts. This is the first milk you are producing for your baby.  False labor is a condition in which you feel small, irregular tightenings of the muscles in the womb (contractions) that eventually go away. These are called Braxton Hicks contractions. Contractions may last for hours, days, or even weeks before true labor sets in.  Signs of labor can include: abdominal cramps; regular contractions that start at 10 minutes apart and become stronger and more frequent with time; watery or bloody mucus discharge that comes from the vagina; increased pelvic pressure and dull back pain; and leaking of amniotic fluid. This information is not intended to replace advice  given to you by your health care provider. Make sure you discuss any questions you have with your health care provider. Document Released: 01/01/2001 Document Revised: 06/15/2015 Document Reviewed: 03/10/2012 [employer] Interactive Patient Education  2017 [employer]   Breastfeeding Deciding to breastfeed is one of the best choices you can make for you and your baby. A change in hormones during pregnancy causes your breast tissue to grow and increases the number and size of your milk ducts. These hormones also allow proteins, sugars, and fats from your blood supply to make breast milk in your milk-producing glands. Hormones prevent breast milk from being released before your baby is born as well as prompt milk flow after birth. Once breastfeeding has begun, thoughts of your baby, as well as his or her sucking or crying, can stimulate the release of milk from your milk-producing glands. Benefits of breastfeeding For Your Baby  Your first milk (colostrum) helps your baby's digestive system function better.  There are antibodies in your milk that help your baby fight off infections.  Your baby has a lower incidence of asthma, allergies, and sudden infant death syndrome.  The nutrients in breast milk are better for your baby than infant formulas and are designed uniquely for your baby's needs.  Breast milk improves your baby's brain development.  Your baby is less likely to develop other conditions, such as childhood obesity, asthma, or type 2 diabetes mellitus.  For You  Breastfeeding helps to create a very special bond between you and your baby.  Breastfeeding is convenient. Breast milk is always available at the correct temperature and costs nothing.  Breastfeeding helps to burn calories and helps you lose the weight gained during pregnancy.  Breastfeeding makes your uterus contract to its prepregnancy size faster and slows bleeding (lochia) after you give birth.  Breastfeeding helps  to lower your risk of developing type 2 diabetes mellitus, osteoporosis, and breast or ovarian cancer later in life.  Signs that your baby is hungry Early Signs of Hunger    Increased alertness or activity.  Stretching.  Movement of the head from side to side.  Movement of the head and opening of the mouth when the corner of the mouth or cheek is stroked (rooting).  Increased sucking sounds, smacking lips, cooing, sighing, or squeaking.  Hand-to-mouth movements.  Increased sucking of fingers or hands.  Late Signs of Hunger  Fussing.  Intermittent crying.  Extreme Signs of Hunger Signs of extreme hunger will require calming and consoling before your baby will be able to breastfeed successfully. Do not wait for the following signs of extreme hunger to occur before you initiate breastfeeding:  Restlessness.  A loud, strong cry.  Screaming.  Breastfeeding basics Breastfeeding Initiation  Find a comfortable place to sit or lie down, with your neck and back well supported.  Place a pillow or rolled up blanket under your baby to bring him or her to the level of your breast (if you are seated). Nursing pillows are specially designed to help support your arms and your baby while you breastfeed.  Make sure that your baby's abdomen is facing your abdomen.  Gently massage your breast. With your fingertips, massage from your chest wall toward your nipple in a circular motion. This encourages milk flow. You may need to continue this action during the feeding if your milk flows slowly.  Support your breast with 4 fingers underneath and your thumb above your nipple. Make sure your fingers are well away from your nipple and your baby's mouth.  Stroke your baby's lips gently with your finger or nipple.  When your baby's mouth is open wide enough, quickly bring your baby to your breast, placing your entire nipple and as much of the colored area around your nipple (areola) as possible into  your baby's mouth. ? More areola should be visible above your baby's upper lip than below the lower lip. ? Your baby's tongue should be between his or her lower gum and your breast.  Ensure that your baby's mouth is correctly positioned around your nipple (latched). Your baby's lips should create a seal on your breast and be turned out (everted).  It is common for your baby to suck about 2-3 minutes in order to start the flow of breast milk.  Latching Teaching your baby how to latch on to your breast properly is very important. An improper latch can cause nipple pain and decreased milk supply for you and poor weight gain in your baby. Also, if your baby is not latched onto your nipple properly, he or she may swallow some air during feeding. This can make your baby fussy. Burping your baby when you switch breasts during the feeding can help to get rid of the air. However, teaching your baby to latch on properly is still the best way to prevent fussiness from swallowing air while breastfeeding. Signs that your baby has successfully latched on to your nipple:  Silent tugging or silent sucking, without causing you pain.  Swallowing heard between every 3-4 sucks.  Muscle movement above and in front of his or her ears while sucking.  Signs that your baby has not successfully latched on to nipple:  Sucking sounds or smacking sounds from your baby while breastfeeding.  Nipple pain.  If you think your baby has not latched on correctly, slip your finger into the corner of your baby's mouth to break the suction and place it between your baby's gums. Attempt breastfeeding initiation again. Signs of Successful Breastfeeding Signs from your baby:  A   gradual decrease in the number of sucks or complete cessation of sucking.  Falling asleep.  Relaxation of his or her body.  Retention of a small amount of milk in his or her mouth.  Letting go of your breast by himself or herself.  Signs from  you:  Breasts that have increased in firmness, weight, and size 1-3 hours after feeding.  Breasts that are softer immediately after breastfeeding.  Increased milk volume, as well as a change in milk consistency and color by the fifth day of breastfeeding.  Nipples that are not sore, cracked, or bleeding.  Signs That Your Baby is Getting Enough Milk  Wetting at least 1-2 diapers during the first 24 hours after birth.  Wetting at least 5-6 diapers every 24 hours for the first week after birth. The urine should be clear or pale yellow by 5 days after birth.  Wetting 6-8 diapers every 24 hours as your baby continues to grow and develop.  At least 3 stools in a 24-hour period by age 5 days. The stool should be soft and yellow.  At least 3 stools in a 24-hour period by age 7 days. The stool should be seedy and yellow.  No loss of weight greater than 10% of birth weight during the first 3 days of age.  Average weight gain of 4-7 ounces (113-198 g) per week after age 4 days.  Consistent daily weight gain by age 5 days, without weight loss after the age of 2 weeks.  After a feeding, your baby may spit up a small amount. This is common. Breastfeeding frequency and duration Frequent feeding will help you make more milk and can prevent sore nipples and breast engorgement. Breastfeed when you feel the need to reduce the fullness of your breasts or when your baby shows signs of hunger. This is called "breastfeeding on demand." Avoid introducing a pacifier to your baby while you are working to establish breastfeeding (the first 4-6 weeks after your baby is born). After this time you may choose to use a pacifier. Research has shown that pacifier use during the first year of a baby's life decreases the risk of sudden infant death syndrome (SIDS). Allow your baby to feed on each breast as long as he or she wants. Breastfeed until your baby is finished feeding. When your baby unlatches or falls asleep  while feeding from the first breast, offer the second breast. Because newborns are often sleepy in the first few weeks of life, you may need to awaken your baby to get him or her to feed. Breastfeeding times will vary from baby to baby. However, the following rules can serve as a guide to help you ensure that your baby is properly fed:  Newborns (babies 4 weeks of age or younger) may breastfeed every 1-3 hours.  Newborns should not go longer than 3 hours during the day or 5 hours during the night without breastfeeding.  You should breastfeed your baby a minimum of 8 times in a 24-hour period until you begin to introduce solid foods to your baby at around 6 months of age.  Breast milk pumping Pumping and storing breast milk allows you to ensure that your baby is exclusively fed your breast milk, even at times when you are unable to breastfeed. This is especially important if you are going back to work while you are still breastfeeding or when you are not able to be present during feedings. Your lactation consultant can give you guidelines on how   long it is safe to store breast milk. A breast pump is a machine that allows you to pump milk from your breast into a sterile bottle. The pumped breast milk can then be stored in a refrigerator or freezer. Some breast pumps are operated by hand, while others use electricity. Ask your lactation consultant which type will work best for you. Breast pumps can be purchased, but some hospitals and breastfeeding support groups lease breast pumps on a monthly basis. A lactation consultant can teach you how to hand express breast milk, if you prefer not to use a pump. Caring for your breasts while you breastfeed Nipples can become dry, cracked, and sore while breastfeeding. The following recommendations can help keep your breasts moisturized and healthy:  Avoid using soap on your nipples.  Wear a supportive bra. Although not required, special nursing bras and tank  tops are designed to allow access to your breasts for breastfeeding without taking off your entire bra or top. Avoid wearing underwire-style bras or extremely tight bras.  Air dry your nipples for 3-4minutes after each feeding.  Use only cotton bra pads to absorb leaked breast milk. Leaking of breast milk between feedings is normal.  Use lanolin on your nipples after breastfeeding. Lanolin helps to maintain your skin's normal moisture barrier. If you use pure lanolin, you do not need to wash it off before feeding your baby again. Pure lanolin is not toxic to your baby. You may also hand express a few drops of breast milk and gently massage that milk into your nipples and allow the milk to air dry.  In the first few weeks after giving birth, some women experience extremely full breasts (engorgement). Engorgement can make your breasts feel heavy, warm, and tender to the touch. Engorgement peaks within 3-5 days after you give birth. The following recommendations can help ease engorgement:  Completely empty your breasts while breastfeeding or pumping. You may want to start by applying warm, moist heat (in the shower or with warm water-soaked hand towels) just before feeding or pumping. This increases circulation and helps the milk flow. If your baby does not completely empty your breasts while breastfeeding, pump any extra milk after he or she is finished.  Wear a snug bra (nursing or regular) or tank top for 1-2 days to signal your body to slightly decrease milk production.  Apply ice packs to your breasts, unless this is too uncomfortable for you.  Make sure that your baby is latched on and positioned properly while breastfeeding.  If engorgement persists after 48 hours of following these recommendations, contact your health care provider or a lactation consultant. Overall health care recommendations while breastfeeding  Eat healthy foods. Alternate between meals and snacks, eating 3 of each per  day. Because what you eat affects your breast milk, some of the foods may make your baby more irritable than usual. Avoid eating these foods if you are sure that they are negatively affecting your baby.  Drink milk, fruit juice, and water to satisfy your thirst (about 10 glasses a day).  Rest often, relax, and continue to take your prenatal vitamins to prevent fatigue, stress, and anemia.  Continue breast self-awareness checks.  Avoid chewing and smoking tobacco. Chemicals from cigarettes that pass into breast milk and exposure to secondhand smoke may harm your baby.  Avoid alcohol and drug use, including marijuana. Some medicines that may be harmful to your baby can pass through breast milk. It is important to ask your health care   provider before taking any medicine, including all over-the-counter and prescription medicine as well as vitamin and herbal supplements. It is possible to become pregnant while breastfeeding. If birth control is desired, ask your health care provider about options that will be safe for your baby. Contact a health care provider if:  You feel like you want to stop breastfeeding or have become frustrated with breastfeeding.  You have painful breasts or nipples.  Your nipples are cracked or bleeding.  Your breasts are red, tender, or warm.  You have a swollen area on either breast.  You have a fever or chills.  You have nausea or vomiting.  You have drainage other than breast milk from your nipples.  Your breasts do not become full before feedings by the fifth day after you give birth.  You feel sad and depressed.  Your baby is too sleepy to eat well.  Your baby is having trouble sleeping.  Your baby is wetting less than 3 diapers in a 24-hour period.  Your baby has less than 3 stools in a 24-hour period.  Your baby's skin or the white part of his or her eyes becomes yellow.  Your baby is not gaining weight by 5 days of age. Get help right away  if:  Your baby is overly tired (lethargic) and does not want to wake up and feed.  Your baby develops an unexplained fever. This information is not intended to replace advice given to you by your health care provider. Make sure you discuss any questions you have with your health care provider. Document Released: 01/07/2005 Document Revised: 06/21/2015 Document Reviewed: 07/01/2012 Elsevier Interactive Patient Education  2017 Elsevier Inc.  

## 2016-07-15 NOTE — Addendum Note (Signed)
Addended by: Reva BoresPRATT, Aaryan Essman S on: 07/15/2016 09:19 AM   Modules accepted: Orders

## 2016-07-16 LAB — HIV ANTIBODY (ROUTINE TESTING W REFLEX): HIV SCREEN 4TH GENERATION: NONREACTIVE

## 2016-07-16 LAB — CBC
HEMATOCRIT: 36 % (ref 34.0–46.6)
HEMOGLOBIN: 11.8 g/dL (ref 11.1–15.9)
MCH: 29.4 pg (ref 26.6–33.0)
MCHC: 32.8 g/dL (ref 31.5–35.7)
MCV: 90 fL (ref 79–97)
Platelets: 226 10*3/uL (ref 150–379)
RBC: 4.02 x10E6/uL (ref 3.77–5.28)
RDW: 13.6 % (ref 12.3–15.4)
WBC: 9.9 10*3/uL (ref 3.4–10.8)

## 2016-07-16 LAB — RPR: RPR: NONREACTIVE

## 2016-07-16 LAB — ANTIBODY SCREEN: ANTIBODY SCREEN: NEGATIVE

## 2016-07-16 LAB — GLUCOSE TOLERANCE, 2 HOURS W/ 1HR
Glucose, 1 hour: 147 mg/dL (ref 65–179)
Glucose, 2 hour: 92 mg/dL (ref 65–152)
Glucose, Fasting: 82 mg/dL (ref 65–91)

## 2016-07-16 LAB — RUBELLA SCREEN: Rubella Antibodies, IGG: 6.28 index (ref >0.99)

## 2016-07-16 LAB — HEPATITIS B SURFACE ANTIGEN: Hepatitis B Surface Ag: NEGATIVE

## 2016-07-25 ENCOUNTER — Ambulatory Visit (HOSPITAL_COMMUNITY)
Admission: RE | Admit: 2016-07-25 | Discharge: 2016-07-25 | Disposition: A | Discharge status: Home / Self Care | Payer: Medicaid Other | Source: Ambulatory Visit | Attending: Family Medicine | Admitting: Family Medicine

## 2016-07-25 ENCOUNTER — Encounter (HOSPITAL_COMMUNITY): Payer: Self-pay

## 2016-07-25 DIAGNOSIS — O99323 Drug use complicating pregnancy, third trimester: Secondary | ICD-10-CM | POA: Insufficient documentation

## 2016-07-25 DIAGNOSIS — O10013 Pre-existing essential hypertension complicating pregnancy, third trimester: Secondary | ICD-10-CM | POA: Insufficient documentation

## 2016-07-25 DIAGNOSIS — Z3A28 28 weeks gestation of pregnancy: Secondary | ICD-10-CM | POA: Insufficient documentation

## 2016-07-25 DIAGNOSIS — O10919 Unspecified pre-existing hypertension complicating pregnancy, unspecified trimester: Secondary | ICD-10-CM

## 2016-07-25 DIAGNOSIS — O34219 Maternal care for unspecified type scar from previous cesarean delivery: Secondary | ICD-10-CM | POA: Diagnosis not present

## 2016-07-25 NOTE — Addendum Note (Signed)
Encounter addended by: Melynda KellerVics, Nakiah Osgood R, RT on: 07/25/2016 12:22 PM<BR>    Actions taken: Imaging Exam ended

## 2016-08-01 ENCOUNTER — Ambulatory Visit (INDEPENDENT_AMBULATORY_CARE_PROVIDER_SITE_OTHER): Payer: Medicaid Other | Admitting: Obstetrics and Gynecology

## 2016-08-01 VITALS — BP 114/60 | HR 95 | Wt 161.2 lb

## 2016-08-01 DIAGNOSIS — O10913 Unspecified pre-existing hypertension complicating pregnancy, third trimester: Secondary | ICD-10-CM

## 2016-08-01 DIAGNOSIS — O99513 Diseases of the respiratory system complicating pregnancy, third trimester: Secondary | ICD-10-CM

## 2016-08-01 DIAGNOSIS — O99519 Diseases of the respiratory system complicating pregnancy, unspecified trimester: Principal | ICD-10-CM

## 2016-08-01 DIAGNOSIS — O99323 Drug use complicating pregnancy, third trimester: Secondary | ICD-10-CM

## 2016-08-01 DIAGNOSIS — O0990 Supervision of high risk pregnancy, unspecified, unspecified trimester: Secondary | ICD-10-CM

## 2016-08-01 DIAGNOSIS — O10919 Unspecified pre-existing hypertension complicating pregnancy, unspecified trimester: Secondary | ICD-10-CM

## 2016-08-01 DIAGNOSIS — O0993 Supervision of high risk pregnancy, unspecified, third trimester: Secondary | ICD-10-CM

## 2016-08-01 DIAGNOSIS — G5601 Carpal tunnel syndrome, right upper limb: Secondary | ICD-10-CM

## 2016-08-01 DIAGNOSIS — J45909 Unspecified asthma, uncomplicated: Secondary | ICD-10-CM

## 2016-08-01 DIAGNOSIS — O99322 Drug use complicating pregnancy, second trimester: Secondary | ICD-10-CM

## 2016-08-01 DIAGNOSIS — O34219 Maternal care for unspecified type scar from previous cesarean delivery: Secondary | ICD-10-CM

## 2016-08-01 NOTE — Progress Notes (Signed)
Prenatal Visit Note Date: 08/01/2016 Clinic: [facility]  Subjective:  Selena Haynes is a 31 y.o. G3P2002 at 1651w6d being seen today for ongoing prenatal care.  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  Patient reports wrist bracing helps. Contractions: Not present. Vag. Bleeding: None.  Movement: Present. Denies leaking of fluid.   The following portions of the patient's history were reviewed and updated as appropriate: allergies, current medications, past family history, past medical history, past social history, past surgical history and problem list. Problem list updated.  Objective:   Vitals:   08/01/16 0918  BP: 114/60  Pulse: 95  Weight: 161 lb 3.2 oz (73.1 kg)    Fetal Status: Fetal Heart Rate (bpm): 140   Movement: Present     General:  Alert, oriented and cooperative. Patient is in no acute distress.  Skin: Skin is warm and dry. No rash noted.   Cardiovascular: Normal heart rate noted  Respiratory: Normal respiratory effort, no problems with respiration noted  Abdomen: Soft, gravid, appropriate for gestational age. Pain/Pressure: Absent     Pelvic:  Cervical exam deferred        Extremities: Normal range of motion.  Edema: Trace  Mental Status: Normal mood and affect. Normal behavior. Normal judgment and thought content.   Urinalysis:      Assessment and Plan:  Pregnancy: G3P2002 at 6551w6d  1. Asthma affecting pregnancy, antepartum Patient confused as when to take what;  she thought both the qvar and albuterol were PRN D/w her to do the qvar 80 2 puffs bid and should only need the albuterol 3-4x/week and if needs to use it or the nebulizer at home more frequently to call and can adjust her long acting INH. Patient already getting serial growths and AP due to cHTN  2. Carpal tunnel syndrome, right upper limb No issues  3. Chronic hypertension affecting pregnancy Doing well on no meds. Continue baby asa. Normal recent growth u/s. Continue with serial growths and start ap testing at 32wks  4. Supervision of high risk pregnancy, antepartum Routine care. D/w pt re: BC nv  5. Previous cesarean delivery affecting pregnancy, antepartum Would like to tolac. Consent signed   Preterm labor symptoms and general obstetric precautions including but not limited to vaginal bleeding, contractions, leaking of fluid and fetal movement were reviewed in detail with the patient. Please refer to After Visit Summary for other counseling recommendations.  Return in about 3 weeks (around 08/22/2016) for 2x/week testing and rob.   [place] BingPickens, Raegan Sipp, MD

## 2016-08-01 NOTE — Progress Notes (Signed)
Educated pt on [facility] [place] 

## 2016-08-14 ENCOUNTER — Ambulatory Visit (INDEPENDENT_AMBULATORY_CARE_PROVIDER_SITE_OTHER): Payer: Medicaid Other | Admitting: Obstetrics and Gynecology

## 2016-08-14 VITALS — BP 116/69 | HR 97 | Wt 163.0 lb

## 2016-08-14 DIAGNOSIS — O0993 Supervision of high risk pregnancy, unspecified, third trimester: Secondary | ICD-10-CM

## 2016-08-14 DIAGNOSIS — O0990 Supervision of high risk pregnancy, unspecified, unspecified trimester: Secondary | ICD-10-CM

## 2016-08-14 DIAGNOSIS — O99322 Drug use complicating pregnancy, second trimester: Secondary | ICD-10-CM

## 2016-08-14 DIAGNOSIS — J45909 Unspecified asthma, uncomplicated: Secondary | ICD-10-CM

## 2016-08-14 DIAGNOSIS — O34219 Maternal care for unspecified type scar from previous cesarean delivery: Secondary | ICD-10-CM

## 2016-08-14 DIAGNOSIS — O99323 Drug use complicating pregnancy, third trimester: Secondary | ICD-10-CM

## 2016-08-14 DIAGNOSIS — O10919 Unspecified pre-existing hypertension complicating pregnancy, unspecified trimester: Secondary | ICD-10-CM

## 2016-08-14 DIAGNOSIS — O99519 Diseases of the respiratory system complicating pregnancy, unspecified trimester: Secondary | ICD-10-CM

## 2016-08-14 DIAGNOSIS — O99513 Diseases of the respiratory system complicating pregnancy, third trimester: Secondary | ICD-10-CM

## 2016-08-14 DIAGNOSIS — O10913 Unspecified pre-existing hypertension complicating pregnancy, third trimester: Secondary | ICD-10-CM

## 2016-08-14 NOTE — Progress Notes (Signed)
Prenatal Visit Note Date: 08/14/2016 [facility]  Subjective:  Selena Haynes is a 31 y.o. G3P2002 at 5828w5d being seen today for ongoing prenatal care.  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  Patient reports: see RN Contractions: Not present. Vag. Bleeding: None.  Movement: Present. Denies leaking of fluid.   The following portions of the patient's history were reviewed and updated as appropriate: allergies, current medications, past family history, past medical history, past social history, past surgical history and problem list. Problem list updated.  Objective:   Vitals:   08/14/16 1303  BP: 116/69  Pulse: 97  Weight: 163 lb (73.9 kg)    Fetal Status: Fetal Heart Rate (bpm): 143 Fundal Height: 31 cm Movement: Present     General:  Alert, oriented and cooperative. Patient is in no acute distress.  Skin: Skin is warm and dry. No rash noted.   Cardiovascular: Normal heart rate noted  Respiratory: Normal respiratory effort, no problems with respiration noted  Abdomen: Soft, gravid, appropriate for gestational age. Pain/Pressure: Absent     Pelvic:  Cervical exam deferred        Extremities: Normal range of motion.  Edema: Trace  Mental Status: Normal mood and affect. Normal behavior. Normal judgment and thought content.   Urinalysis:      Assessment and Plan:  Pregnancy: G3P2002 at 6928w5d  1. Chronic hypertension affecting pregnancy Doing well on  no meds. Has antenatal testing already set up.   2. Asthma affecting pregnancy, antepartum Doing qvar 2 puffs bid and sometimes in the middle of the night and the albuterol maybe once per day and rarely using the nebs. She states she had seen a pulm doctor in the past but there's nothing in our system. Recommend setting up her for an outpatient consult.  - Ambulatory referral to Pulmonology  3. Supervision of high risk pregnancy, antepartum Routine care. D/w pt re: BC nv - Ambulatory referral to Pulmonology  4. Previous cesarean delivery affecting pregnancy, antepartum Desires TOLAC. Consent already signed  5. Substance abuse affecting pregnancy in second trimester, antepartum No issues currently.   Preterm labor symptoms and general obstetric precautions including but not limited to vaginal bleeding, contractions, leaking of fluid and fetal movement were reviewed in detail with the patient. Please refer to After Visit Summary for other counseling recommendations.  Return in about 2 weeks (around 08/28/2016).   Selena Haynes, Selena Ehrler, MD

## 2016-08-14 NOTE — Progress Notes (Signed)
Pt reports that she fell down stairs last week. She was on her bottom. Has some bruising on her right side. She did not come to [facility] for evaluation.

## 2016-08-14 NOTE — Progress Notes (Signed)
[facility] appt scheduled for August 2nd @ 1430.  Pt notified.

## 2016-08-19 ENCOUNTER — Ambulatory Visit: Payer: Self-pay

## 2016-08-19 ENCOUNTER — Ambulatory Visit (INDEPENDENT_AMBULATORY_CARE_PROVIDER_SITE_OTHER): Payer: Medicaid Other | Admitting: Obstetrics and Gynecology

## 2016-08-19 ENCOUNTER — Ambulatory Visit (INDEPENDENT_AMBULATORY_CARE_PROVIDER_SITE_OTHER): Payer: Medicaid Other | Admitting: *Deleted

## 2016-08-19 VITALS — BP 115/61 | HR 121 | Wt 164.9 lb

## 2016-08-19 DIAGNOSIS — O99519 Diseases of the respiratory system complicating pregnancy, unspecified trimester: Secondary | ICD-10-CM

## 2016-08-19 DIAGNOSIS — O34219 Maternal care for unspecified type scar from previous cesarean delivery: Secondary | ICD-10-CM

## 2016-08-19 DIAGNOSIS — O10919 Unspecified pre-existing hypertension complicating pregnancy, unspecified trimester: Secondary | ICD-10-CM

## 2016-08-19 DIAGNOSIS — J45909 Unspecified asthma, uncomplicated: Secondary | ICD-10-CM

## 2016-08-19 DIAGNOSIS — O0993 Supervision of high risk pregnancy, unspecified, third trimester: Secondary | ICD-10-CM

## 2016-08-19 DIAGNOSIS — O0990 Supervision of high risk pregnancy, unspecified, unspecified trimester: Secondary | ICD-10-CM

## 2016-08-19 DIAGNOSIS — O10913 Unspecified pre-existing hypertension complicating pregnancy, third trimester: Secondary | ICD-10-CM | POA: Diagnosis present

## 2016-08-19 DIAGNOSIS — O99513 Diseases of the respiratory system complicating pregnancy, third trimester: Secondary | ICD-10-CM

## 2016-08-19 NOTE — Progress Notes (Signed)
Subjective:  Selena Haynes is a 31 y.o. G3P2002 at 5620w3d being seen today for ongoing prenatal care.  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  Patient reports no complaints.  Contractions: Irregular. Vag. Bleeding: None.  Movement: Present. Denies leaking of fluid.   The following portions of the patient's history were reviewed and updated as appropriate: allergies, current medications, past family history, past medical history, past social history, past surgical history and problem list. Problem list updated.  Objective:   Vitals:   08/19/16 1444  BP: 115/61  Pulse: (!) 121  Weight: 164 lb 14.4 oz (74.8 kg)    Fetal Status: Fetal Heart Rate (bpm): NST   Movement: Present     General:  Alert, oriented and cooperative. Patient is in no acute distress.  Skin: Skin is warm and dry. No rash noted.   Cardiovascular: Normal heart rate noted  Respiratory: Normal respiratory effort, no problems with respiration noted  Abdomen: Soft, gravid, appropriate for gestational age. Pain/Pressure: Absent     Pelvic:  Cervical exam deferred        Extremities: Normal range of motion.  Edema: Trace  Mental Status: Normal mood and affect. Normal behavior. Normal judgment and thought content.   Urinalysis:      Assessment and Plan:  Pregnancy: G3P2002 at 920w3d  1. Chronic hypertension affecting pregnancy Stable 10/10 BPP today Continue with weekly antenatal testing  2. Supervision of high risk pregnancy,  antepartum Stable  3. Previous cesarean delivery affecting pregnancy, antepartum TOLAC papers signed  4. Asthma affecting pregnancy, antepartum To see Pulmonary this Thursday  Preterm labor symptoms and general obstetric precautions including but not limited to vaginal bleeding, contractions, leaking of fluid and fetal movement were reviewed in detail with the patient. Please refer to After Visit Summary for other counseling recommendations.  Return in about 7 days (around 08/26/2016) for 8/6 or 8/7  NST/BPP, Ob fu.   Hermina StaggersErvin, Alessandra Sawdey L, MD

## 2016-08-19 NOTE — Progress Notes (Signed)
US for growth scheduled on 8/2. Weekly antenatal testing started today.

## 2016-08-19 NOTE — Progress Notes (Signed)

## 2016-08-20 LAB — POCT URINALYSIS DIP (DEVICE)
Bilirubin Urine: NEGATIVE
Glucose, UA: NEGATIVE mg/dL
HGB URINE DIPSTICK: NEGATIVE
Ketones, ur: NEGATIVE mg/dL
LEUKOCYTES UA: NEGATIVE
Nitrite: NEGATIVE
Protein, ur: NEGATIVE mg/dL
Specific Gravity, Urine: 1.015 (ref 1.005–1.030)
Urobilinogen, UA: 0.2 mg/dL (ref 0.0–1.0)
pH: 7 (ref 5.0–8.0)

## 2016-08-22 ENCOUNTER — Other Ambulatory Visit: Payer: Self-pay | Admitting: Family Medicine

## 2016-08-22 ENCOUNTER — Encounter: Payer: Self-pay | Admitting: Internal Medicine

## 2016-08-22 ENCOUNTER — Ambulatory Visit (HOSPITAL_COMMUNITY): Admission: RE | Admit: 2016-08-22 | Payer: Medicaid Other | Source: Ambulatory Visit

## 2016-08-22 ENCOUNTER — Ambulatory Visit (INDEPENDENT_AMBULATORY_CARE_PROVIDER_SITE_OTHER): Payer: Medicaid Other | Admitting: Internal Medicine

## 2016-08-22 VITALS — BP 102/60 | HR 100 | Ht 66.0 in | Wt 165.0 lb

## 2016-08-22 DIAGNOSIS — J45909 Unspecified asthma, uncomplicated: Secondary | ICD-10-CM | POA: Diagnosis not present

## 2016-08-22 DIAGNOSIS — O99519 Diseases of the respiratory system complicating pregnancy, unspecified trimester: Secondary | ICD-10-CM

## 2016-08-22 MED ORDER — BUDESONIDE-FORMOTEROL FUMARATE 160-4.5 MCG/ACT IN AERO: 2.0000 | INHALATION_SPRAY | Freq: Two times a day (BID) | RESPIRATORY_TRACT | 0 refills | Status: DC

## 2016-08-22 MED ORDER — BUDESONIDE-FORMOTEROL FUMARATE 160-4.5 MCG/ACT IN AERO: 2.0000 | INHALATION_SPRAY | Freq: Two times a day (BID) | RESPIRATORY_TRACT | 11 refills | Status: DC

## 2016-08-22 NOTE — Patient Instructions (Addendum)
Stop Qvar (brown inhaler)  Plan A = Automatic =  Symbicort 160 Take 2 puffs first thing in am and then another 2 puffs about 12 hours later.    Work on inhaler technique:  relax and gently blow all the way out then take a nice smooth deep breath back in, triggering the inhaler at same time you start breathing in.  Hold for up to 5 seconds if you can. Blow out thru nose. Rinse and gargle with water when done   Plan B = Backup Only use your albuterol as a rescue medication to be used if you can't catch your breath by resting or doing a relaxed purse lip breathing pattern.  - The less you use it, the better it will work when you need it. - Ok to use the inhaler up to 2 puffs  every 4 hours if you must but call for appointment if use goes up over your usual need - Don't leave home without it !!  (think of it like the spare tire for your car)   Plan C = Crisis - only use your albuterol nebulizer if you first try Plan B and it fails to help > ok to use the nebulizer up to every 4 hours but if start needing it regularly call for immediate appointment     Please schedule a follow up office visit in 4 weeks, sooner if needed

## 2016-08-22 NOTE — Progress Notes (Signed)
Subjective:     Patient ID: Selena Haynes, female   DOB: 1985/06/25,     MRN: [id number]  HPIer   31 yobf quit  Smoking 02/2016  With onset of asthma early 2000's while [place] with variable severity and on and off advair then moved to [place] around summer of 2016 worse [facility]  And on d/c ? ? Meds short term then moved to [place] on qvar 80 2 bid plus freq saba up to 4-5 per week while pregnant  so referred to pulmonary clinic 08/22/2016 by Dr   Selena Haynes    08/22/2016 1st [facility] office visit/ Selena Haynes  @ [redacted] weeks gestation  Chief Complaint  Patient presents with  . Pulmonary Consult    Self referral. Pt states dxed with Asthma at age 31. She c/o increased SOB off and on for the past few months. She has also noticed some wheezing. She is coughing some, but not producing anyu sputum. She is using proair 4 x per wk on average.   variable doe but never at rest unless during coughing fit but not producing much mucus, overall much worse since IUP Less gerd than prev IUP/ only mild nasal congestion/ watery rhinitis Has neb saba but hasn't used in months   No obvious patterns day to day or daytime variability or assoc excess/ purulent sputum or mucus plugs or hemoptysis or cp or chest tightness,  overt sinus or hb symptoms. No unusual exp hx or h/o childhood pna/ asthma or knowledge of premature birth.  Sleeping ok without nocturnal  or early am exacerbation  of respiratory  c/o's or need for noct saba. Also denies any obvious fluctuation of symptoms with weather or environmental changes or other aggravating or alleviating factors except as outlined above   Current Medications, Allergies, Complete Past Medical History, Past Surgical History, Family History, and Social History were reviewed in [facility] electronic medical record.  ROS  The following are not active complaints unless bolded sore throat, dysphagia, dental problems, itching, sneezing,  nasal congestion or excess/  purulent secretions, ear ache,   fever, chills, sweats, unintended wt loss, classically pleuritic or exertional cp,  orthopnea pnd or leg swelling, presyncope, palpitations, abdominal pain, anorexia, nausea, vomiting, diarrhea  or change in bowel or bladder habits, change in stools or urine, dysuria,hematuria,  rash, arthralgias, visual complaints, headache, numbness, weakness or ataxia or problems with walking or coordination,  change in mood/affect or memory.          Review of Systems     Objective:   Physical Exam Pleasant amb bf of approx stated age and gestation nad   Wt Readings from Last 3 Encounters:  08/22/16 165 lb (74.8 kg)  08/19/16 164 lb 14.4 oz (74.8 kg)  08/14/16 163 lb (73.9 kg)    Vital signs reviewed   HEENT: nl dentition, turbinates bilaterally, and oropharynx. Nl external ear canals without cough reflex   NECK :  without JVD/Nodes/TM/ nl carotid upstrokes bilaterally   LUNGS: no acc muscle use,  Nl contour chest which is clear to A and P bilaterally without cough on insp or exp maneuvers   CV:  RRR  no s3 or murmur or increase in P2, and no edema   ABD:  soft and nontender c/w [redacted] week gestation with nl inspiratory excursion in the supine position.    MS:  Nl gait/ ext warm without deformities, calf tenderness, cyanosis or clubbing No obvious joint restrictions   SKIN: warm and  dry without lesions    NEURO:  alert, approp, nl sensorium with  no motor or cerebellar deficits apparent.    No cxr's on file  Labs  reviewed:      Chemistry      Component Value Date/Time   NA 136 04/22/2016 1111   K 3.4 (L) 04/22/2016 1111   CL 103 04/22/2016 1111   CO2 23 04/22/2016 1111   BUN 6 04/22/2016 1111   CREATININE 0.65 04/22/2016 1111   CREATININE 0.74 02/26/2016 1433      Component Value Date/Time   CALCIUM 9.4 04/22/2016 1111   ALKPHOS 47 04/22/2016 1111   AST 20 04/22/2016 1111   ALT 15 04/22/2016 1111   BILITOT 0.2 (L) 04/22/2016 1111         Lab Results  Component Value Date   WBC 9.9 07/15/2016   HGB 11.8 07/15/2016   HCT 36.0 07/15/2016   MCV 90 07/15/2016   PLT 226 07/15/2016         Lab Results  Component Value Date   TSH 1.810 05/22/2016             Assessment:

## 2016-08-23 ENCOUNTER — Encounter: Payer: Self-pay | Admitting: Internal Medicine

## 2016-08-23 NOTE — Assessment & Plan Note (Signed)
08/22/2016  After extensive coaching HFA effectiveness =    75% > try symbicort 160 2bid as breaking rule of 2's on qvar 80 2bid   Discussed the problem of poorly controlled asthma in pregnancy vs risk of meds and rule of 2s which she's breaking   Discussed in detail all the  indications, usual  risks and alternatives  relative to the benefits with patient who agrees to proceed with trial of symb 160 2bid as budesonide has the best rating in pregnancy and high dose beclamethasone by itself not effective to relieve symptoms and minimize use of saba, the stated goals of rx  Will re-eval in 4 weeks, sooner if needed  Total time devoted to counseling  > 50 % of initial 60 min office visit:  review case with pt/ discussion of options/alternatives/ personally creating written customized instructions  in presence of pt  then going over those specific  Instructions directly with the pt including how to use all of the meds but in particular covering each new medication in detail and the difference between the maintenance= "automatic" meds and the prns using an action plan format for the latter (If this problem/symptom => do that organization reading Left to right).  Please see AVS from this visit for a full list of these instructions which I personally wrote for this pt and  are unique to this visit.

## 2016-08-26 ENCOUNTER — Ambulatory Visit: Payer: Self-pay

## 2016-08-26 ENCOUNTER — Ambulatory Visit (INDEPENDENT_AMBULATORY_CARE_PROVIDER_SITE_OTHER): Payer: Medicaid Other | Admitting: *Deleted

## 2016-08-26 ENCOUNTER — Ambulatory Visit (INDEPENDENT_AMBULATORY_CARE_PROVIDER_SITE_OTHER): Payer: Medicaid Other | Admitting: Obstetrics & Gynecology

## 2016-08-26 ENCOUNTER — Other Ambulatory Visit: Payer: Self-pay

## 2016-08-26 DIAGNOSIS — O10913 Unspecified pre-existing hypertension complicating pregnancy, third trimester: Secondary | ICD-10-CM | POA: Diagnosis not present

## 2016-08-26 DIAGNOSIS — O0993 Supervision of high risk pregnancy, unspecified, third trimester: Secondary | ICD-10-CM

## 2016-08-26 DIAGNOSIS — O10919 Unspecified pre-existing hypertension complicating pregnancy, unspecified trimester: Secondary | ICD-10-CM

## 2016-08-26 DIAGNOSIS — O0990 Supervision of high risk pregnancy, unspecified, unspecified trimester: Secondary | ICD-10-CM

## 2016-08-26 MED ORDER — PANTOPRAZOLE SODIUM 40 MG PO TBEC: 40.0000 mg | DELAYED_RELEASE_TABLET | Freq: Every day | ORAL | 2 refills | Status: DC

## 2016-08-26 NOTE — Progress Notes (Signed)
   PRENATAL VISIT NOTE  Subjective:  Selena Haynes is a 31 y.o. G3P2002 at 7491w3d being seen today for ongoing prenatal care.  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  Patient reports heartburn, nausea and vomiting.   .  .   . Denies leaking of fluid.   The following portions of the patient's history were reviewed and updated as appropriate: allergies, current medications, past family history, past medical history, past social history, past surgical history and problem list. Problem list updated.  Objective:  There were no vitals filed for this visit.  Fetal Status:           General:  Alert, oriented and cooperative. Patient is in no acute distress.  Skin: Skin is warm and dry. No rash noted.   Cardiovascular: Normal heart rate noted  Respiratory: Normal respiratory effort, no problems with respiration noted  Abdomen: Soft, gravid, appropriate for gestational age.        Pelvic: Cervical exam deferred        Extremities: Normal range of motion.     Mental Status:  Normal mood and affect. Normal behavior. Normal judgment and thought content.   Assessment and Plan:  Pregnancy: G3P2002 at 6591w3d  1. Chronic hypertension affecting pregnancy Nl BP  - US MFM FETAL BPP WO NON STRESS; Future - pantoprazole (PROTONIX) 40 MG tablet; Take 1 tablet (40 mg total) by mouth daily.  Dispense: 30 tablet; Refill: 2  2. Supervision of high risk pregnancy, antepartum GERD - US MFM FETAL BPP WO NON STRESS; Future -  pantoprazole (PROTONIX) 40 MG tablet; Take 1 tablet (40 mg total) by mouth daily.  Dispense: 30 tablet; Refill: 2  Preterm labor symptoms and general obstetric precautions including but not limited to vaginal bleeding, contractions, leaking of fluid and fetal movement were reviewed in detail with the patient. Please refer to After Visit Summary for other counseling recommendations.  Return in about 1 week (around 09/02/2016) for 8/27 or 8/28  NST/BPP and Ob fu;  9/4  NST and Ob fu - has US @ 1430.   Scheryl DarterJames Makyna Niehoff, MD

## 2016-08-26 NOTE — Patient Instructions (Signed)
Vaginal Bleeding During Pregnancy, Third Trimester °A small amount of bleeding (spotting) from the vagina is common in pregnancy. Sometimes the bleeding is normal and is not a problem, and sometimes it is a sign of something serious. Be sure to tell your doctor about any bleeding from your vagina right away. °Follow these instructions at home: °· Watch your condition for any changes. °· Follow your doctor's instructions about how active you can be. °· If you are on bed rest: °? You may need to stay in bed and only get up to use the bathroom. °? You may be allowed to do some activities. °? If you need help, make plans for someone to help you. °· Write down: °? The number of pads you use each day. °? How often you change pads. °? How soaked (saturated) your pads are. °· Do not use tampons. °· Do not douche. °· Do not have sex or orgasms until your doctor says it is okay. °· Follow your doctor's advice about lifting, driving, and doing physical activities. °· If you pass any tissue from your vagina, save the tissue so you can show it to your doctor. °· Only take medicines as told by your doctor. °· Do not take aspirin because it can make you bleed. °· Keep all follow-up visits as told by your doctor. °Contact a doctor if: °· You bleed from your vagina. °· You have cramps. °· You have labor pains. °· You have a fever that does not go away after you take medicine. °Get help right away if: °· You have very bad cramps in your back or belly (abdomen). °· You have chills. °· You have a gush of fluid from your vagina. °· You pass large clots or tissue from your vagina. °· You bleed more. °· You feel light-headed or weak. °· You pass out (faint). °· You do not feel your baby move around as much as before. °This information is not intended to replace advice given to you by your health care provider. Make sure you discuss any questions you have with your health care provider. °Document Released: 05/24/2013 Document Revised:  06/15/2015 Document Reviewed: 09/14/2012 °Elsevier Interactive Patient Education © 2018 Elsevier Inc. ° °

## 2016-08-26 NOTE — Progress Notes (Signed)

## 2016-08-26 NOTE — Progress Notes (Signed)
Pt reports increased heartburn, nausea and vomiting for the past 2 days - has not taken Zantac regularly. she also has increased pain in legs and pelvis. US for growth scheduled 8/8 and 9/4.

## 2016-08-28 ENCOUNTER — Encounter (HOSPITAL_COMMUNITY): Payer: Self-pay

## 2016-08-28 ENCOUNTER — Other Ambulatory Visit (HOSPITAL_COMMUNITY): Payer: Self-pay | Admitting: Obstetrics and Gynecology

## 2016-08-28 ENCOUNTER — Ambulatory Visit (HOSPITAL_COMMUNITY)
Admission: RE | Admit: 2016-08-28 | Discharge: 2016-08-28 | Disposition: A | Discharge status: Home / Self Care | Payer: Medicaid Other | Source: Ambulatory Visit | Attending: Obstetrics and Gynecology | Admitting: Obstetrics and Gynecology

## 2016-08-28 DIAGNOSIS — J45909 Unspecified asthma, uncomplicated: Secondary | ICD-10-CM | POA: Diagnosis not present

## 2016-08-28 DIAGNOSIS — O99513 Diseases of the respiratory system complicating pregnancy, third trimester: Secondary | ICD-10-CM | POA: Insufficient documentation

## 2016-08-28 DIAGNOSIS — Z3A33 33 weeks gestation of pregnancy: Secondary | ICD-10-CM | POA: Diagnosis not present

## 2016-08-28 DIAGNOSIS — O10919 Unspecified pre-existing hypertension complicating pregnancy, unspecified trimester: Secondary | ICD-10-CM

## 2016-08-28 DIAGNOSIS — O10913 Unspecified pre-existing hypertension complicating pregnancy, third trimester: Secondary | ICD-10-CM | POA: Insufficient documentation

## 2016-08-29 ENCOUNTER — Other Ambulatory Visit: Payer: Self-pay | Admitting: Obstetrics & Gynecology

## 2016-09-02 ENCOUNTER — Other Ambulatory Visit: Payer: Self-pay

## 2016-09-02 ENCOUNTER — Ambulatory Visit (INDEPENDENT_AMBULATORY_CARE_PROVIDER_SITE_OTHER): Payer: Medicaid Other | Admitting: Obstetrics & Gynecology

## 2016-09-02 ENCOUNTER — Ambulatory Visit: Payer: Self-pay

## 2016-09-02 ENCOUNTER — Ambulatory Visit (INDEPENDENT_AMBULATORY_CARE_PROVIDER_SITE_OTHER): Payer: Medicaid Other | Admitting: *Deleted

## 2016-09-02 VITALS — BP 101/60 | HR 79 | Wt 170.1 lb

## 2016-09-02 DIAGNOSIS — O10913 Unspecified pre-existing hypertension complicating pregnancy, third trimester: Secondary | ICD-10-CM

## 2016-09-02 DIAGNOSIS — O10919 Unspecified pre-existing hypertension complicating pregnancy, unspecified trimester: Secondary | ICD-10-CM

## 2016-09-02 DIAGNOSIS — O0990 Supervision of high risk pregnancy, unspecified, unspecified trimester: Secondary | ICD-10-CM

## 2016-09-02 DIAGNOSIS — O0993 Supervision of high risk pregnancy, unspecified, third trimester: Secondary | ICD-10-CM

## 2016-09-02 NOTE — Progress Notes (Signed)

## 2016-09-02 NOTE — Progress Notes (Signed)
   PRENATAL VISIT NOTE  Subjective:  Selena Haynes is a 31 y.o. G3P2002 at 61103w3d being seen today for ongoing prenatal care.  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  Patient reports no complaints.  Contractions: Irregular. Vag. Bleeding: None.  Movement: Present. Denies leaking of fluid.   The following portions of the patient's history were reviewed and updated as appropriate: allergies, current medications, past family history, past medical history, past social history, past surgical history and problem list. Problem list updated.  Objective:   Vitals:   09/02/16 1259  BP: 101/60  Pulse: (!) 129  Weight: 170 lb 1.6 oz (77.2 kg)    Fetal Status: Fetal Heart Rate (bpm): NST   Movement: Present     General:  Alert, oriented and cooperative. Patient is in no acute distress.  Skin: Skin is warm and dry. No rash noted.   Cardiovascular: Normal heart rate noted  Respiratory: Normal respiratory effort, no problems with respiration noted  Abdomen: Soft, gravid, appropriate for gestational age.  Pain/Pressure: Absent     Pelvic: Cervical exam deferred        Extremities: Normal range of motion.  Edema: Trace  Mental Status:  Normal mood and affect. Normal behavior. Normal judgment and thought content.   Assessment and Plan:  Pregnancy: G3P2002 at 79103w3d  1. Supervision of high risk pregnancy, antepartum No concerns today  2. Chronic hypertension affecting pregnancy BP stable NST reactive BPP 8/10  3.   Tachycardia on first set of vital signs Rpt is 79.  No palpations.  Nml rate and rhythm.  4.  Asthma Followed by pulmonology  Preterm labor symptoms and general obstetric precautions including but not limited to vaginal bleeding, contractions, leaking of fluid and fetal movement were reviewed in detail with the patient. Please refer to After Visit Summary for other counseling recommendations.  Return in about 7 days (around 09/09/2016) for weekly as scheduled.   Elsie LincolnKelly Courtney Fenlon, MD

## 2016-09-05 ENCOUNTER — Other Ambulatory Visit: Payer: Self-pay | Admitting: Family Medicine

## 2016-09-09 ENCOUNTER — Ambulatory Visit (INDEPENDENT_AMBULATORY_CARE_PROVIDER_SITE_OTHER): Payer: Medicaid Other | Admitting: Obstetrics and Gynecology

## 2016-09-09 ENCOUNTER — Ambulatory Visit (INDEPENDENT_AMBULATORY_CARE_PROVIDER_SITE_OTHER): Payer: Medicaid Other | Admitting: *Deleted

## 2016-09-09 ENCOUNTER — Ambulatory Visit: Payer: Self-pay

## 2016-09-09 VITALS — BP 129/63 | HR 112 | Wt 168.5 lb

## 2016-09-09 DIAGNOSIS — O10913 Unspecified pre-existing hypertension complicating pregnancy, third trimester: Secondary | ICD-10-CM

## 2016-09-09 DIAGNOSIS — J45909 Unspecified asthma, uncomplicated: Secondary | ICD-10-CM

## 2016-09-09 DIAGNOSIS — O34219 Maternal care for unspecified type scar from previous cesarean delivery: Secondary | ICD-10-CM

## 2016-09-09 DIAGNOSIS — O0990 Supervision of high risk pregnancy, unspecified, unspecified trimester: Secondary | ICD-10-CM

## 2016-09-09 DIAGNOSIS — O10919 Unspecified pre-existing hypertension complicating pregnancy, unspecified trimester: Secondary | ICD-10-CM

## 2016-09-09 DIAGNOSIS — O99519 Diseases of the respiratory system complicating pregnancy, unspecified trimester: Secondary | ICD-10-CM

## 2016-09-09 LAB — POCT URINALYSIS DIP (DEVICE)
BILIRUBIN URINE: NEGATIVE
Glucose, UA: NEGATIVE mg/dL
HGB URINE DIPSTICK: NEGATIVE
KETONES UR: NEGATIVE mg/dL
Leukocytes, UA: NEGATIVE
NITRITE: NEGATIVE
PH: 7 (ref 5.0–8.0)
PROTEIN: NEGATIVE mg/dL
SPECIFIC GRAVITY, URINE: 1.02 (ref 1.005–1.030)
UROBILINOGEN UA: 0.2 mg/dL (ref 0.0–1.0)

## 2016-09-09 NOTE — Progress Notes (Signed)
Prenatal Visit Note Date: 09/09/2016 Clinic: [facility]  Subjective:  Selena Haynes is a 31 y.o. G3P2002 at [redacted]w[redacted]d being seen today for ongoing prenatal care.  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  Patient reports no complaints.   Contractions: Irregular. Vag. Bleeding: None.  Movement: Present. Denies leaking of fluid.   The following portions of the patient's history were reviewed and updated as appropriate: allergies, current medications, past family history, past medical history, past social history, past surgical history and problem list. Problem list updated.  Objective:   Vitals:   09/09/16 1338  BP: 129/63  Pulse: (!) 112  Weight: 168 lb 8 oz (76.4 kg)    Fetal Status: Fetal Heart Rate (bpm): NST   Movement: Present     General:  Alert, oriented and cooperative. Patient is in no acute distress.  Skin: Skin is warm and dry. No rash noted.   Cardiovascular: Normal heart rate noted  Respiratory: Normal respiratory effort, no problems with respiration noted  Abdomen: Soft, gravid, appropriate for gestational age. Pain/Pressure: Present     Pelvic:  Cervical exam deferred        Extremities: Normal range of motion.     Mental Status: Normal mood and affect. Normal behavior. Normal judgment and thought content.   Urinalysis:      Assessment and Plan:  Pregnancy: G3P2002 at [redacted]w[redacted]d  1. Chronic hypertension affecting pregnancy rNST today (145 baseline, +accels, no decel, mod  var, toco quiet x 22m) BPP 8/8. Continue with qwk testing, normal 8/9 growth. Rpt in one month  2. Supervision of high risk pregnancy, antepartum Routine care. D/w pt re: BC nv  3. Previous cesarean delivery affecting pregnancy, antepartum tolac consent signed already  4. Asthma affecting pregnancy, antepartum On symbicort and s/s better. Has f/u with [facility] early september, she states. Already getting AP testing.   Preterm labor symptoms and general obstetric precautions including but not limited to vaginal bleeding, contractions, leaking of fluid and fetal movement were reviewed in detail with the patient. Please refer to After Visit Summary for other counseling recommendations.  Return in about 2 weeks (around 09/24/2016) for If possible please change appt to NST/BPP @ 1240, then Ob fu @ 1340 - pt has [place] @ 1430.   Cumberland Bing, MD

## 2016-09-09 NOTE — Progress Notes (Signed)

## 2016-09-09 NOTE — Progress Notes (Signed)
US for growth and BPP scheduled on 9/4. 

## 2016-09-16 ENCOUNTER — Ambulatory Visit (INDEPENDENT_AMBULATORY_CARE_PROVIDER_SITE_OTHER): Payer: Medicaid Other | Admitting: *Deleted

## 2016-09-16 ENCOUNTER — Ambulatory Visit: Payer: Self-pay

## 2016-09-16 ENCOUNTER — Ambulatory Visit (INDEPENDENT_AMBULATORY_CARE_PROVIDER_SITE_OTHER): Payer: Medicaid Other | Admitting: Family Medicine

## 2016-09-16 ENCOUNTER — Encounter: Payer: Self-pay | Admitting: Family Medicine

## 2016-09-16 VITALS — BP 118/65 | HR 111 | Wt 172.9 lb

## 2016-09-16 DIAGNOSIS — O0990 Supervision of high risk pregnancy, unspecified, unspecified trimester: Secondary | ICD-10-CM

## 2016-09-16 DIAGNOSIS — O34219 Maternal care for unspecified type scar from previous cesarean delivery: Secondary | ICD-10-CM

## 2016-09-16 DIAGNOSIS — O10919 Unspecified pre-existing hypertension complicating pregnancy, unspecified trimester: Secondary | ICD-10-CM

## 2016-09-16 DIAGNOSIS — O10913 Unspecified pre-existing hypertension complicating pregnancy, third trimester: Secondary | ICD-10-CM

## 2016-09-16 LAB — POCT URINALYSIS DIP (DEVICE)
Bilirubin Urine: NEGATIVE
Glucose, UA: NEGATIVE mg/dL
HGB URINE DIPSTICK: NEGATIVE
Ketones, ur: NEGATIVE mg/dL
Leukocytes, UA: NEGATIVE
NITRITE: NEGATIVE
Protein, ur: 30 mg/dL — AB
SPECIFIC GRAVITY, URINE: 1.02 (ref 1.005–1.030)
UROBILINOGEN UA: 0.2 mg/dL (ref 0.0–1.0)
pH: 7 (ref 5.0–8.0)

## 2016-09-16 LAB — OB RESULTS CONSOLE GBS: STREP GROUP B AG: NEGATIVE

## 2016-09-16 NOTE — Progress Notes (Signed)

## 2016-09-16 NOTE — Progress Notes (Signed)
   PRENATAL VISIT NOTE  Subjective:  Selena Haynes is a 31 y.o. G3P2002 at [redacted]w[redacted]d being seen today for ongoing prenatal care.  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  Patient reports no complaints.  Contractions: Irregular. Vag. Bleeding: None.  Movement: Present. Denies leaking of fluid.   The following portions of the patient's history were reviewed and updated as appropriate: allergies, current medications, past family history, past medical history, past social history, past surgical history and problem list. Problem list updated.  Objective:   Vitals:   09/16/16 0905  BP: 118/65  Pulse: (!) 111  Weight: 172 lb 14.4 oz (78.4 kg)    Fetal Status: Fetal Heart Rate (bpm): NST   Movement: Present  Presentation: Vertex  General:  Alert, oriented and cooperative. Patient is in no acute distress.  Skin: Skin is warm and dry. No rash noted.   Cardiovascular: Normal heart rate noted  Respiratory: Normal respiratory effort, no problems with respiration noted  Abdomen: Soft, gravid, appropriate for gestational age.  Pain/Pressure: Present     Pelvic: Cervical exam performed Dilation: Closed Effacement (%): Thick Station: Ballotable  Extremities: Normal range of motion.  Edema: Trace  Mental Status:  Normal mood and affect. Normal behavior. Normal judgment and thought content.  NST:  Baseline: 150 bpm, Variability: Good {> 6 bpm), Accelerations: Reactive and Decelerations: Absent   Assessment and Plan:    Pregnancy: G3P2002 at [redacted]w[redacted]d  1. Chronic hypertension affecting pregnancy Continue ASA On no meds Appropriate growth Continue weekly antenatal testing For IOL at 40 wks.  2. Supervision of high risk pregnancy, antepartum Cultures today - GC/Chlamydia probe amp ([place])not at [facility] - Strep Gp B NAA  3. Previous cesarean delivery affecting pregnancy, antepartum For TOLAC  Preterm labor symptoms and general obstetric precautions including but not limited to vaginal bleeding, contractions, leaking of fluid and fetal movement were reviewed in detail with the patient. Please refer to After Visit Summary for other counseling recommendations.  Return in about 8 days (around 09/24/2016) for as scheduled.   Reva Bores, MD

## 2016-09-16 NOTE — Patient Instructions (Signed)
Breastfeeding Deciding to breastfeed is one of the best choices you can make for you and your baby. A change in hormones during pregnancy causes your breast tissue to grow and increases the number and size of your milk ducts. These hormones also allow proteins, sugars, and fats from your blood supply to make breast milk in your milk-producing glands. Hormones prevent breast milk from being released before your baby is born as well as prompt milk flow after birth. Once breastfeeding has begun, thoughts of your baby, as well as his or her sucking or crying, can stimulate the release of milk from your milk-producing glands. Benefits of breastfeeding For Your Baby  Your first milk (colostrum) helps your baby's digestive system function better.  There are antibodies in your milk that help your baby fight off infections.  Your baby has a lower incidence of asthma, allergies, and sudden infant death syndrome.  The nutrients in breast milk are better for your baby than infant formulas and are designed uniquely for your baby's needs.  Breast milk improves your baby's brain development.  Your baby is less likely to develop other conditions, such as childhood obesity, asthma, or type 2 diabetes mellitus.  For You  Breastfeeding helps to create a very special bond between you and your baby.  Breastfeeding is convenient. Breast milk is always available at the correct temperature and costs nothing.  Breastfeeding helps to burn calories and helps you lose the weight gained during pregnancy.  Breastfeeding makes your uterus contract to its prepregnancy size faster and slows bleeding (lochia) after you give birth.  Breastfeeding helps to lower your risk of developing type 2 diabetes mellitus, osteoporosis, and breast or ovarian cancer later in life.  Signs that your baby is hungry Early Signs of Hunger  Increased alertness or activity.  Stretching.  Movement of the head from side to  side.  Movement of the head and opening of the mouth when the corner of the mouth or cheek is stroked (rooting).  Increased sucking sounds, smacking lips, cooing, sighing, or squeaking.  Hand-to-mouth movements.  Increased sucking of fingers or hands.  Late Signs of Hunger  Fussing.  Intermittent crying.  Extreme Signs of Hunger Signs of extreme hunger will require calming and consoling before your baby will be able to breastfeed successfully. Do not wait for the following signs of extreme hunger to occur before you initiate breastfeeding:  Restlessness.  A loud, strong cry.  Screaming.  Breastfeeding basics Breastfeeding Initiation  Find a comfortable place to sit or lie down, with your neck and back well supported.  Place a pillow or rolled up blanket under your baby to bring him or her to the level of your breast (if you are seated). Nursing pillows are specially designed to help support your arms and your baby while you breastfeed.  Make sure that your baby's abdomen is facing your abdomen.  Gently massage your breast. With your fingertips, massage from your chest wall toward your nipple in a circular motion. This encourages milk flow. You may need to continue this action during the feeding if your milk flows slowly.  Support your breast with 4 fingers underneath and your thumb above your nipple. Make sure your fingers are well away from your nipple and your baby's mouth.  Stroke your baby's lips gently with your finger or nipple.  When your baby's mouth is open wide enough, quickly bring your baby to your breast, placing your entire nipple and as much of the colored area   around your nipple (areola) as possible into your baby's mouth. ? More areola should be visible above your baby's upper lip than below the lower lip. ? Your baby's tongue should be between his or her lower gum and your breast.  Ensure that your baby's mouth is correctly positioned around your nipple  (latched). Your baby's lips should create a seal on your breast and be turned out (everted).  It is common for your baby to suck about 2-3 minutes in order to start the flow of breast milk.  Latching Teaching your baby how to latch on to your breast properly is very important. An improper latch can cause nipple pain and decreased milk supply for you and poor weight gain in your baby. Also, if your baby is not latched onto your nipple properly, he or she may swallow some air during feeding. This can make your baby fussy. Burping your baby when you switch breasts during the feeding can help to get rid of the air. However, teaching your baby to latch on properly is still the best way to prevent fussiness from swallowing air while breastfeeding. Signs that your baby has successfully latched on to your nipple:  Silent tugging or silent sucking, without causing you pain.  Swallowing heard between every 3-4 sucks.  Muscle movement above and in front of his or her ears while sucking.  Signs that your baby has not successfully latched on to nipple:  Sucking sounds or smacking sounds from your baby while breastfeeding.  Nipple pain.  If you think your baby has not latched on correctly, slip your finger into the corner of your baby's mouth to break the suction and place it between your baby's gums. Attempt breastfeeding initiation again. Signs of Successful Breastfeeding Signs from your baby:  A gradual decrease in the number of sucks or complete cessation of sucking.  Falling asleep.  Relaxation of his or her body.  Retention of a small amount of milk in his or her mouth.  Letting go of your breast by himself or herself.  Signs from you:  Breasts that have increased in firmness, weight, and size 1-3 hours after feeding.  Breasts that are softer immediately after breastfeeding.  Increased milk volume, as well as a change in milk consistency and color by the fifth day of  breastfeeding.  Nipples that are not sore, cracked, or bleeding.  Signs That Your Baby is Getting Enough Milk  Wetting at least 1-2 diapers during the first 24 hours after birth.  Wetting at least 5-6 diapers every 24 hours for the first week after birth. The urine should be clear or pale yellow by 5 days after birth.  Wetting 6-8 diapers every 24 hours as your baby continues to grow and develop.  At least 3 stools in a 24-hour period by age 5 days. The stool should be soft and yellow.  At least 3 stools in a 24-hour period by age 7 days. The stool should be seedy and yellow.  No loss of weight greater than 10% of birth weight during the first 3 days of age.  Average weight gain of 4-7 ounces (113-198 g) per week after age 4 days.  Consistent daily weight gain by age 5 days, without weight loss after the age of 2 weeks.  After a feeding, your baby may spit up a small amount. This is common. Breastfeeding frequency and duration Frequent feeding will help you make more milk and can prevent sore nipples and breast engorgement. Breastfeed when   you feel the need to reduce the fullness of your breasts or when your baby shows signs of hunger. This is called "breastfeeding on demand." Avoid introducing a pacifier to your baby while you are working to establish breastfeeding (the first 4-6 weeks after your baby is born). After this time you may choose to use a pacifier. Research has shown that pacifier use during the first year of a baby's life decreases the risk of sudden infant death syndrome (SIDS). Allow your baby to feed on each breast as long as he or she wants. Breastfeed until your baby is finished feeding. When your baby unlatches or falls asleep while feeding from the first breast, offer the second breast. Because newborns are often sleepy in the first few weeks of life, you may need to awaken your baby to get him or her to feed. Breastfeeding times will vary from baby to baby. However,  the following rules can serve as a guide to help you ensure that your baby is properly fed:  Newborns (babies 4 weeks of age or younger) may breastfeed every 1-3 hours.  Newborns should not go longer than 3 hours during the day or 5 hours during the night without breastfeeding.  You should breastfeed your baby a minimum of 8 times in a 24-hour period until you begin to introduce solid foods to your baby at around 6 months of age.  Breast milk pumping Pumping and storing breast milk allows you to ensure that your baby is exclusively fed your breast milk, even at times when you are unable to breastfeed. This is especially important if you are going back to work while you are still breastfeeding or when you are not able to be present during feedings. Your lactation consultant can give you guidelines on how long it is safe to store breast milk. A breast pump is a machine that allows you to pump milk from your breast into a sterile bottle. The pumped breast milk can then be stored in a refrigerator or freezer. Some breast pumps are operated by hand, while others use electricity. Ask your lactation consultant which type will work best for you. Breast pumps can be purchased, but some hospitals and breastfeeding support groups lease breast pumps on a monthly basis. A lactation consultant can teach you how to hand express breast milk, if you prefer not to use a pump. Caring for your breasts while you breastfeed Nipples can become dry, cracked, and sore while breastfeeding. The following recommendations can help keep your breasts moisturized and healthy:  Avoid using soap on your nipples.  Wear a supportive bra. Although not required, special nursing bras and tank tops are designed to allow access to your breasts for breastfeeding without taking off your entire bra or top. Avoid wearing underwire-style bras or extremely tight bras.  Air dry your nipples for 3-4minutes after each feeding.  Use only cotton  bra pads to absorb leaked breast milk. Leaking of breast milk between feedings is normal.  Use lanolin on your nipples after breastfeeding. Lanolin helps to maintain your skin's normal moisture barrier. If you use pure lanolin, you do not need to wash it off before feeding your baby again. Pure lanolin is not toxic to your baby. You may also hand express a few drops of breast milk and gently massage that milk into your nipples and allow the milk to air dry.  In the first few weeks after giving birth, some women experience extremely full breasts (engorgement). Engorgement can make your   breasts feel heavy, warm, and tender to the touch. Engorgement peaks within 3-5 days after you give birth. The following recommendations can help ease engorgement:  Completely empty your breasts while breastfeeding or pumping. You may want to start by applying warm, moist heat (in the shower or with warm water-soaked hand towels) just before feeding or pumping. This increases circulation and helps the milk flow. If your baby does not completely empty your breasts while breastfeeding, pump any extra milk after he or she is finished.  Wear a snug bra (nursing or regular) or tank top for 1-2 days to signal your body to slightly decrease milk production.  Apply ice packs to your breasts, unless this is too uncomfortable for you.  Make sure that your baby is latched on and positioned properly while breastfeeding.  If engorgement persists after 48 hours of following these recommendations, contact your health care provider or a lactation consultant. Overall health care recommendations while breastfeeding  Eat healthy foods. Alternate between meals and snacks, eating 3 of each per day. Because what you eat affects your breast milk, some of the foods may make your baby more irritable than usual. Avoid eating these foods if you are sure that they are negatively affecting your baby.  Drink milk, fruit juice, and water to  satisfy your thirst (about 10 glasses a day).  Rest often, relax, and continue to take your prenatal vitamins to prevent fatigue, stress, and anemia.  Continue breast self-awareness checks.  Avoid chewing and smoking tobacco. Chemicals from cigarettes that pass into breast milk and exposure to secondhand smoke may harm your baby.  Avoid alcohol and drug use, including marijuana. Some medicines that may be harmful to your baby can pass through breast milk. It is important to ask your health care provider before taking any medicine, including all over-the-counter and prescription medicine as well as vitamin and herbal supplements. It is possible to become pregnant while breastfeeding. If birth control is desired, ask your health care provider about options that will be safe for your baby. Contact a health care provider if:  You feel like you want to stop breastfeeding or have become frustrated with breastfeeding.  You have painful breasts or nipples.  Your nipples are cracked or bleeding.  Your breasts are red, tender, or warm.  You have a swollen area on either breast.  You have a fever or chills.  You have nausea or vomiting.  You have drainage other than breast milk from your nipples.  Your breasts do not become full before feedings by the fifth day after you give birth.  You feel sad and depressed.  Your baby is too sleepy to eat well.  Your baby is having trouble sleeping.  Your baby is wetting less than 3 diapers in a 24-hour period.  Your baby has less than 3 stools in a 24-hour period.  Your baby's skin or the white part of his or her eyes becomes yellow.  Your baby is not gaining weight by 5 days of age. Get help right away if:  Your baby is overly tired (lethargic) and does not want to wake up and feed.  Your baby develops an unexplained fever. This information is not intended to replace advice given to you by your health care provider. Make sure you discuss  any questions you have with your health care provider. Document Released: 01/07/2005 Document Revised: 06/21/2015 Document Reviewed: 07/01/2012 Elsevier Interactive Patient Education  2017 [employer]  

## 2016-09-16 NOTE — Progress Notes (Signed)
[place] for growth and BPP scheduled on 9/4.

## 2016-09-17 ENCOUNTER — Other Ambulatory Visit: Payer: Self-pay

## 2016-09-17 LAB — GC/CHLAMYDIA PROBE AMP (~~LOC~~) NOT AT ARMC
CHLAMYDIA, DNA PROBE: NEGATIVE
NEISSERIA GONORRHEA: NEGATIVE

## 2016-09-18 LAB — STREP GP B NAA: Strep Gp B NAA: NEGATIVE

## 2016-09-19 ENCOUNTER — Telehealth: Payer: Self-pay

## 2016-09-19 ENCOUNTER — Encounter: Payer: Self-pay | Admitting: Obstetrics & Gynecology

## 2016-09-19 ENCOUNTER — Ambulatory Visit (HOSPITAL_COMMUNITY): Payer: Medicaid Other

## 2016-09-19 ENCOUNTER — Other Ambulatory Visit: Payer: Self-pay

## 2016-09-19 NOTE — Telephone Encounter (Signed)
Pt called and stated that she is having sore in her pelvic area.  Is there anything that she can do?

## 2016-09-24 ENCOUNTER — Other Ambulatory Visit: Payer: Self-pay

## 2016-09-24 ENCOUNTER — Ambulatory Visit (INDEPENDENT_AMBULATORY_CARE_PROVIDER_SITE_OTHER): Payer: Medicaid Other | Admitting: Obstetrics and Gynecology

## 2016-09-24 ENCOUNTER — Encounter: Payer: Self-pay | Admitting: Advanced Practice Midwife

## 2016-09-24 ENCOUNTER — Other Ambulatory Visit (HOSPITAL_COMMUNITY): Payer: Self-pay | Admitting: Obstetrics and Gynecology

## 2016-09-24 ENCOUNTER — Ambulatory Visit (INDEPENDENT_AMBULATORY_CARE_PROVIDER_SITE_OTHER): Payer: Medicaid Other | Admitting: *Deleted

## 2016-09-24 ENCOUNTER — Ambulatory Visit (HOSPITAL_COMMUNITY)
Admission: RE | Admit: 2016-09-24 | Discharge: 2016-09-24 | Disposition: A | Discharge status: Home / Self Care | Payer: Medicaid Other | Source: Ambulatory Visit | Attending: Obstetrics and Gynecology | Admitting: Obstetrics and Gynecology

## 2016-09-24 ENCOUNTER — Encounter (HOSPITAL_COMMUNITY): Payer: Self-pay

## 2016-09-24 ENCOUNTER — Encounter: Payer: Self-pay | Admitting: Internal Medicine

## 2016-09-24 ENCOUNTER — Ambulatory Visit (INDEPENDENT_AMBULATORY_CARE_PROVIDER_SITE_OTHER): Payer: Medicaid Other | Admitting: Internal Medicine

## 2016-09-24 VITALS — BP 112/70 | HR 126 | Ht 66.0 in | Wt 172.0 lb

## 2016-09-24 VITALS — BP 112/69 | HR 110 | Wt 172.5 lb

## 2016-09-24 DIAGNOSIS — J45909 Unspecified asthma, uncomplicated: Secondary | ICD-10-CM | POA: Diagnosis not present

## 2016-09-24 DIAGNOSIS — O34219 Maternal care for unspecified type scar from previous cesarean delivery: Secondary | ICD-10-CM

## 2016-09-24 DIAGNOSIS — J310 Chronic rhinitis: Secondary | ICD-10-CM

## 2016-09-24 DIAGNOSIS — O10919 Unspecified pre-existing hypertension complicating pregnancy, unspecified trimester: Secondary | ICD-10-CM | POA: Diagnosis present

## 2016-09-24 DIAGNOSIS — O99519 Diseases of the respiratory system complicating pregnancy, unspecified trimester: Secondary | ICD-10-CM

## 2016-09-24 DIAGNOSIS — F1010 Alcohol abuse, uncomplicated: Secondary | ICD-10-CM

## 2016-09-24 DIAGNOSIS — O10013 Pre-existing essential hypertension complicating pregnancy, third trimester: Secondary | ICD-10-CM

## 2016-09-24 DIAGNOSIS — Z3A37 37 weeks gestation of pregnancy: Secondary | ICD-10-CM | POA: Diagnosis not present

## 2016-09-24 DIAGNOSIS — O99313 Alcohol use complicating pregnancy, third trimester: Secondary | ICD-10-CM

## 2016-09-24 DIAGNOSIS — O0993 Supervision of high risk pregnancy, unspecified, third trimester: Secondary | ICD-10-CM

## 2016-09-24 DIAGNOSIS — F1210 Cannabis abuse, uncomplicated: Secondary | ICD-10-CM

## 2016-09-24 DIAGNOSIS — O0990 Supervision of high risk pregnancy, unspecified, unspecified trimester: Secondary | ICD-10-CM | POA: Insufficient documentation

## 2016-09-24 DIAGNOSIS — K219 Gastro-esophageal reflux disease without esophagitis: Secondary | ICD-10-CM

## 2016-09-24 DIAGNOSIS — O10913 Unspecified pre-existing hypertension complicating pregnancy, third trimester: Secondary | ICD-10-CM

## 2016-09-24 NOTE — Assessment & Plan Note (Signed)
Flared with IUP > reviewed diet  / continue ppi q am and h2hs   see avs for instructions unique to this ov

## 2016-09-24 NOTE — Progress Notes (Signed)
Subjective:     Patient ID: Selena Haynes, female   DOB: 1985/08/03,     MRN: [id number]     Brief patient profile:  31 yobf quit  Smoking 02/2016  With onset of asthma early 2000's while [place] with variable severity and on and off advair then moved to [place] around summer of 2016 worse and admitted to [facility]  And on d/c ? ? Meds short term then moved to [place] on qvar 80 2 bid plus freq saba up to 4-5 per week while pregnant  so referred to pulmonary clinic 08/22/2016 by Dr   Ronnie DossPickens/Ervins   History of Present Illness  08/22/2016 1st [facility] office visit/ Selena Haynes  @ [redacted] weeks gestation  Chief Complaint  Patient presents with  . Pulmonary Consult    Self referral. Pt states dxed with Asthma at age 31. She c/o increased SOB off and on for the past few months. She has also noticed some wheezing. She is coughing some, but not producing anyu sputum. She is using proair 4 x per wk on average.   variable doe but never at rest unless during coughing fit but not producing much mucus, overall much worse since IUP Less gerd than prev IUP/ only mild nasal congestion/ watery rhinitis Has neb saba but hasn't used in months  rec Stop Qvar (brown inhaler) Plan A = Automatic =  Symbicort 160 Take 2 puffs first thing in am and then another 2 puffs about 12 hours later.  Work on inhaler technique:  Plan B = Backup Only use your albuterol as a rescue medication  Plan C = Crisis - only use your albuterol nebulizer if you first try Plan B and it fails to help > ok to use the nebulizer up to every 4 hours but if start needing it regularly call for immediate appointment   09/24/2016  f/u ov/Selena Haynes re:  Asthma dtc during pregnancy  Chief Complaint  Patient presents with  . Follow-up    Breathing has improved some. She is using proair now once per wk on average.   much better breathing and less need for saba  but coughs if slides down under 45 degrees but no need for rescue saba hs and still lots of noct nasal  congestion Just  A few times needed saba hfa/ no neb  Overt hb despite ppi and h2hs but also chewing lots of mint gum    No obvious day to day or daytime variability or assoc excess/ purulent sputum or mucus plugs or hemoptysis or cp or chest tightness, subjective wheeze. No unusual exp hx or h/o childhood pna/ asthma or knowledge of premature birth.  Sleeping ok without nocturnal  or early am exacerbation  of respiratory  c/o's or need for noct saba. Also denies any obvious fluctuation of symptoms with weather or environmental changes or other aggravating or alleviating factors except as outlined above   Current Medications, Allergies, Complete Past Medical History, Past Surgical History, Family History, and Social History were reviewed in [facility] electronic medical record.  ROS  The following are not active complaints unless bolded sore throat, dysphagia, dental problems, itching, sneezing,  nasal congestion or excess/ purulent secretions leg swelling minimal and no worse than prior, leg swelling, presyncope, palpitations, abdominal pain, anorexia, nausea, vomiting, diarrhea  or change in bowel or bladder habits, change in stools or urine, dysuria,hematuria,  rash, arthralgias, visual complaints, headache, numbness, weakness or ataxia or problems with walking or coordination,  change in mood/affect or  memory.            Objective:   Physical Exam  Pleasant amb bf of approx stated age and gestation nad    09/24/2016         172   08/22/16 165 lb (74.8 kg)  08/19/16 164 lb 14.4 oz (74.8 kg)  08/14/16 163 lb (73.9 kg)    Vital signs reviewed  - Note on arrival 02 sats  97% on RA      HEENT: nl dentition,  and oropharynx. Nl external ear canals without cough reflex - mild bilateral non-specific turbinate edema     NECK :  without JVD/Nodes/TM/ nl carotid upstrokes bilaterally   LUNGS: no acc muscle use,  Nl contour chest which is clear to A and P bilaterally without cough on  insp or exp maneuvers   CV:  RRR  no s3 or murmur or increase in P2, and no edema   ABD:  soft and nontender c/w [redacted] week gestation with nl inspiratory excursion in the supine position.    MS:  Nl gait/ ext warm without deformities, calf tenderness, cyanosis or clubbing No obvious joint restrictions   SKIN: warm and dry without lesions    NEURO:  alert, approp, nl sensorium with  no motor or cerebellar deficits apparent.          Assessment:

## 2016-09-24 NOTE — Patient Instructions (Addendum)
No change medications  GERD (REFLUX)  is an extremely common cause of respiratory symptoms just like yours , many times with no obvious heartburn at all.    It can be treated with medication, but also with lifestyle changes including elevation of the head of your bed (ideally with 6 inch  bed blocks),  Smoking cessation, avoidance of late meals, excessive alcohol, and avoid fatty foods, chocolate, peppermint, colas, red wine, and acidic juices such as orange juice.  NO MINT OR MENTHOL PRODUCTS SO NO COUGH DROPS   USE SUGARLESS CANDY INSTEAD (Jolley ranchers or Stover's or Life Savers) or even ice chips will also do - the key is to swallow to prevent all throat clearing. NO OIL BASED VITAMINS - use powdered substitutes.  Breathe right strips and nasal saline   Please schedule a follow up visit in 3 months but call sooner if needed

## 2016-09-24 NOTE — Assessment & Plan Note (Signed)
08/22/2016   try symbicort 160 2bid as breaking rule of 2's on qvar 80 2bid > improved 09/24/2016 so rec continue symb 160 2bid thru pregancy and f/u in 3 m for ? Stepdown then  - 09/24/2016  After extensive coaching HFA effectiveness =    75% (Ti a bit short)   All goals of chronic asthma control met including optimal function and elimination of symptoms with minimal need for rescue therapy.  Contingencies discussed in full including contacting this office immediately if not controlling the symptoms using the rule of two's.     I had an extended discussion with the patient reviewing all relevant studies completed to date and  lasting 15 to 20 minutes of a 25 minute visit    Each maintenance medication was reviewed in detail including most importantly the difference between maintenance and prns and under what circumstances the prns are to be triggered using an action plan format that is not reflected in the computer generated alphabetically organized AVS.    Please see AVS for specific instructions unique to this visit that I personally wrote and verbalized to the the pt in detail and then reviewed with pt  by my nurse highlighting any  changes in therapy recommended at today's visit to their plan of care.

## 2016-09-24 NOTE — Progress Notes (Signed)
Subjective:  Selena Haynes is a 31 y.o. G3P2002 at 6151w4d being seen today for ongoing prenatal care.  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  Patient reports occasional contractions and general discomforts of pregnancy.   .  .   . Denies leaking of fluid.   The following portions of the patient's history were reviewed and updated as appropriate: allergies, current medications, past family history, past medical history, past social history, past surgical history and problem list. Problem list updated.  Objective:  There were no vitals filed for this visit.  Fetal Status:           General:  Alert, oriented and cooperative. Patient is in no acute distress.  Skin: Skin is warm and dry. No rash noted.   Cardiovascular: Normal heart rate noted  Respiratory: Normal respiratory effort, no problems with respiration noted  Abdomen: Soft, gravid, appropriate for gestational age.       Pelvic:  Cervical exam deferred        Extremities: Normal range of motion.     Mental Status: Normal mood and affect. Normal behavior. Normal judgment and thought content.   Urinalysis:      Assessment and Plan:  Pregnancy: G3P2002 at 6651w4d  1. Chronic hypertension affecting pregnancy BP stable No S/Sx of PEC No BP meds Continue with BASA Reactive NST Continue with antenatal testing IOL at 39 weeks  2. Previous cesarean delivery affecting pregnancy, antepartum For TOLAC  3. Supervision of high risk pregnancy,  antepartum Stable Labor precautions.  Term labor symptoms and general obstetric precautions including but not limited to vaginal bleeding, contractions, leaking of fluid and fetal movement were reviewed in detail with the patient. Please refer to After Visit Summary for other counseling recommendations.  Return in about 1 year (around 09/24/2017) for OB visit.   Hermina StaggersErvin, Zeinab Rodwell L, MD

## 2016-09-24 NOTE — Progress Notes (Signed)
US for growth and BPP @ [facility] today.  IOL scheduled 9/21 @ 0730.

## 2016-09-24 NOTE — Assessment & Plan Note (Signed)
Trial of breathe right strips/ nasal saline >  f/u gyn prn

## 2016-09-24 NOTE — Patient Instructions (Signed)
Vaginal Delivery Vaginal delivery means that you will give birth by pushing your baby out of your birth canal (vagina). A team of health care providers will help you before, during, and after vaginal delivery. Birth experiences are unique for every woman and every pregnancy, and birth experiences vary depending on where you choose to give birth. What should I do to prepare for my baby's birth? Before your baby is born, it is important to talk with your health care provider about:  Your labor and delivery preferences. These may include: ? Medicines that you may be given. ? How you will manage your pain. This might include non-medical pain relief techniques or injectable pain relief such as epidural analgesia. ? How you and your baby will be monitored during labor and delivery. ? Who may be in the labor and delivery room with you. ? Your feelings about surgical delivery of your baby (cesarean delivery, or C-section) if this becomes necessary. ? Your feelings about receiving donated blood through an IV tube (blood transfusion) if this becomes necessary.  Whether you are able: ? To take pictures or videos of the birth. ? To eat during labor and delivery. ? To move around, walk, or change positions during labor and delivery.  What to expect after your baby is born, such as: ? Whether delayed umbilical cord clamping and cutting is offered. ? Who will care for your baby right after birth. ? Medicines or tests that may be recommended for your baby. ? Whether breastfeeding is supported in your hospital or birth center. ? How long you will be in the hospital or birth center.  How any medical conditions you have may affect your baby or your labor and delivery experience.  To prepare for your baby's birth, you should also:  Attend all of your health care visits before delivery (prenatal visits) as recommended by your health care provider. This is important.  Prepare your home for your baby's  arrival. Make sure that you have: ? Diapers. ? Baby clothing. ? Feeding equipment. ? Safe sleeping arrangements for you and your baby.  Install a car seat in your vehicle. Have your car seat checked by a certified car seat installer to make sure that it is installed safely.  Think about who will help you with your new baby at home for at least the first several weeks after delivery.  What can I expect when I arrive at the birth center or hospital? Once you are in labor and have been admitted into the hospital or birth center, your health care provider may:  Review your pregnancy history and any concerns you have.  Insert an IV tube into one of your veins. This is used to give you fluids and medicines.  Check your blood pressure, pulse, temperature, and heart rate (vital signs).  Check whether your bag of water (amniotic sac) has broken (ruptured).  Talk with you about your birth plan and discuss pain control options.  Monitoring Your health care provider may monitor your contractions (uterine monitoring) and your baby's heart rate (fetal monitoring). You may need to be monitored:  Often, but not continuously (intermittently).  All the time or for long periods at a time (continuously). Continuous monitoring may be needed if: ? You are taking certain medicines, such as medicine to relieve pain or make your contractions stronger. ? You have pregnancy or labor complications.  Monitoring may be done by:  Placing a special stethoscope or a handheld monitoring device on your abdomen to   check your baby's heartbeat, and feeling your abdomen for contractions. This method of monitoring does not continuously record your baby's heartbeat or your contractions.  Placing monitors on your abdomen (external monitors) to record your baby's heartbeat and the frequency and length of contractions. You may not have to wear external monitors all the time.  Placing monitors inside of your uterus  (internal monitors) to record your baby's heartbeat and the frequency, length, and strength of your contractions. ? Your health care provider may use internal monitors if he or she needs more information about the strength of your contractions or your baby's heart rate. ? Internal monitors are put in place by passing a thin, flexible wire through your vagina and into your uterus. Depending on the type of monitor, it may remain in your uterus or on your baby's head until birth. ? Your health care provider will discuss the benefits and risks of internal monitoring with you and will ask for your permission before inserting the monitors.  Telemetry. This is a type of continuous monitoring that can be done with external or internal monitors. Instead of having to stay in bed, you are able to move around during telemetry. Ask your health care provider if telemetry is an option for you.  Physical exam Your health care provider may perform a physical exam. This may include:  Checking whether your baby is positioned: ? With the head toward your vagina (head-down). This is most common. ? With the head toward the top of your uterus (head-up or breech). If your baby is in a breech position, your health care provider may try to turn your baby to a head-down position so you can deliver vaginally. If it does not seem that your baby can be born vaginally, your provider may recommend surgery to deliver your baby. In rare cases, you may be able to deliver vaginally if your baby is head-up (breech delivery). ? Lying sideways (transverse). Babies that are lying sideways cannot be delivered vaginally.  Checking your cervix to determine: ? Whether it is thinning out (effacing). ? Whether it is opening up (dilating). ? How low your baby has moved into your birth canal.  What are the three stages of labor and delivery?  Normal labor and delivery is divided into the following three stages: Stage 1  Stage 1 is the  longest stage of labor, and it can last for hours or days. Stage 1 includes: ? Early labor. This is when contractions may be irregular, or regular and mild. Generally, early labor contractions are more than 10 minutes apart. ? Active labor. This is when contractions get longer, more regular, more frequent, and more intense. ? The transition phase. This is when contractions happen very close together, are very intense, and may last longer than during any other part of labor.  Contractions generally feel mild, infrequent, and irregular at first. They get stronger, more frequent (about every 2-3 minutes), and more regular as you progress from early labor through active labor and transition.  Many women progress through stage 1 naturally, but you may need help to continue making progress. If this happens, your health care provider may talk with you about: ? Rupturing your amniotic sac if it has not ruptured yet. ? Giving you medicine to help make your contractions stronger and more frequent.  Stage 1 ends when your cervix is completely dilated to 4 inches (10 cm) and completely effaced. This happens at the end of the transition phase. Stage 2  Once   your cervix is completely effaced and dilated to 4 inches (10 cm), you may start to feel an urge to push. It is common for the body to naturally take a rest before feeling the urge to push, especially if you received an epidural or certain other pain medicines. This rest period may last for up to 1-2 hours, depending on your unique labor experience.  During stage 2, contractions are generally less painful, because pushing helps relieve contraction pain. Instead of contraction pain, you may feel stretching and burning pain, especially when the widest part of your baby's head passes through the vaginal opening (crowning).  Your health care provider will closely monitor your pushing progress and your baby's progress through the vagina during stage 2.  Your  health care provider may massage the area of skin between your vaginal opening and anus (perineum) or apply warm compresses to your perineum. This helps it stretch as the baby's head starts to crown, which can help prevent perineal tearing. ? In some cases, an incision may be made in your perineum (episiotomy) to allow the baby to pass through the vaginal opening. An episiotomy helps to make the opening of the vagina larger to allow more room for the baby to fit through.  It is very important to breathe and focus so your health care provider can control the delivery of your baby's head. Your health care provider may have you decrease the intensity of your pushing, to help prevent perineal tearing.  After delivery of your baby's head, the shoulders and the rest of the body generally deliver very quickly and without difficulty.  Once your baby is delivered, the umbilical cord may be cut right away, or this may be delayed for 1-2 minutes, depending on your baby's health. This may vary among health care providers, hospitals, and birth centers.  If you and your baby are healthy enough, your baby may be placed on your chest or abdomen to help maintain the baby's temperature and to help you bond with each other. Some mothers and babies start breastfeeding at this time. Your health care team will dry your baby and help keep your baby warm during this time.  Your baby may need immediate care if he or she: ? Showed signs of distress during labor. ? Has a medical condition. ? Was born too early (prematurely). ? Had a bowel movement before birth (meconium). ? Shows signs of difficulty transitioning from being inside the uterus to being outside of the uterus. If you are planning to breastfeed, your health care team will help you begin a feeding. Stage 3  The third stage of labor starts immediately after the birth of your baby and ends after you deliver the placenta. The placenta is an organ that develops  during pregnancy to provide oxygen and nutrients to your baby in the womb.  Delivering the placenta may require some pushing, and you may have mild contractions. Breastfeeding can stimulate contractions to help you deliver the placenta.  After the placenta is delivered, your uterus should tighten (contract) and become firm. This helps to stop bleeding in your uterus. To help your uterus contract and to control bleeding, your health care provider may: ? Give you medicine by injection, through an IV tube, by mouth, or through your rectum (rectally). ? Massage your abdomen or perform a vaginal exam to remove any blood clots that are left in your uterus. ? Empty your bladder by placing a thin, flexible tube (catheter) into your bladder. ? Encourage   you to breastfeed your baby. After labor is over, you and your baby will be monitored closely to ensure that you are both healthy until you are ready to go home. Your health care team will teach you how to care for yourself and your baby. This information is not intended to replace advice given to you by your health care provider. Make sure you discuss any questions you have with your health care provider. Document Released: 10/17/2007 Document Revised: 07/28/2015 Document Reviewed: 01/22/2015 Elsevier Interactive Patient Education  2018 [employer]  

## 2016-09-30 ENCOUNTER — Encounter: Payer: Self-pay | Admitting: Obstetrics & Gynecology

## 2016-09-30 ENCOUNTER — Ambulatory Visit (INDEPENDENT_AMBULATORY_CARE_PROVIDER_SITE_OTHER): Payer: Medicaid Other | Admitting: Obstetrics & Gynecology

## 2016-09-30 ENCOUNTER — Ambulatory Visit: Payer: Self-pay

## 2016-09-30 ENCOUNTER — Ambulatory Visit (INDEPENDENT_AMBULATORY_CARE_PROVIDER_SITE_OTHER): Payer: Medicaid Other | Admitting: *Deleted

## 2016-09-30 VITALS — BP 110/65 | HR 96 | Wt 173.4 lb

## 2016-09-30 DIAGNOSIS — O10919 Unspecified pre-existing hypertension complicating pregnancy, unspecified trimester: Secondary | ICD-10-CM

## 2016-09-30 DIAGNOSIS — O10913 Unspecified pre-existing hypertension complicating pregnancy, third trimester: Secondary | ICD-10-CM

## 2016-09-30 DIAGNOSIS — O0993 Supervision of high risk pregnancy, unspecified, third trimester: Secondary | ICD-10-CM

## 2016-09-30 DIAGNOSIS — O0990 Supervision of high risk pregnancy, unspecified, unspecified trimester: Secondary | ICD-10-CM

## 2016-09-30 NOTE — Progress Notes (Signed)

## 2016-09-30 NOTE — Progress Notes (Signed)
Patient here today for routine OB 6666w3d

## 2016-09-30 NOTE — Progress Notes (Signed)
   PRENATAL VISIT NOTE  Subjective:  Selena Haynes is a 31 y.o. G3P2002 at 1678w4d being seen today for ongoing prenatal care.  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  Patient reports no complaints.   .  .   . Denies leaking of fluid.   The following portions of the patient's history were reviewed and updated as appropriate: allergies, current medications, past family history, past medical history, past social history, past surgical history and problem list. Problem list updated.  Objective:  There were no vitals filed for this visit.  Fetal Status:   Fundal Height: 35 cm       General:  Alert, oriented and cooperative. Patient is in no acute distress.  Skin: Skin is warm and dry. No rash noted.   Cardiovascular: Normal heart rate noted  Respiratory: Normal respiratory effort, no problems with respiration noted  Abdomen: Soft, gravid, appropriate for gestational age.        Pelvic: Cervical exam deferred        Extremities: Normal range of motion.     Mental Status:  Normal mood and affect. Normal behavior. Normal judgment and thought content.   Assessment and Plan:  Pregnancy: G3P2002 at 5478w4d  1. Supervision of high risk pregnancy, antepartum Reviewed and revised her dating with unsure LMP and 7 weeks [place] , IOL 40 weeks on 9/13 by US  2. Chronic hypertension affecting pregnancy normotensive  Term labor symptoms and general obstetric precautions  including but not limited to vaginal bleeding, contractions, leaking of fluid and fetal movement were reviewed in detail with the patient. Please refer to After Visit Summary for other counseling recommendations.  Return in about 5 weeks (around 11/04/2016) for postpartum.   Scheryl DarterJames Amiel Sharrow, MD

## 2016-09-30 NOTE — Patient Instructions (Signed)
Vaginal Birth After Cesarean Delivery Vaginal birth after cesarean delivery (VBAC) is giving birth vaginally after previously delivering a baby by a cesarean. In the past, if a woman had a cesarean delivery, all births afterward would be done by cesarean delivery. This is no longer true. It can be safe for the mother to try a vaginal delivery after having a cesarean delivery. It is important to discuss VBAC with your health care provider early in the pregnancy so you can understand the risks, benefits, and options. It will give you time to decide what is best in your particular case. The final decision about whether to have a VBAC or repeat cesarean delivery should be between you and your health care provider. Any changes in your health or your baby's health during your pregnancy may make it necessary to change your initial decision about VBAC. Women who plan to have a VBAC should check with their health care provider to be sure that:  The previous cesarean delivery was done with a low transverse uterine cut (incision) (not a vertical classical incision).  The birth canal is big enough for the baby.  There were no other operations on the uterus.  An electronic fetal monitor (EFM) will be on at all times during labor.  An operating room will be available and ready in case an emergency cesarean delivery is needed.  A health care provider and surgical nursing staff will be available at all times during labor to be ready to do an emergency delivery cesarean if necessary.  An anesthesiologist will be present in case an emergency cesarean delivery is needed.  The nursery is prepared and has adequate personnel and necessary equipment available to care for the baby in case of an emergency cesarean delivery. Benefits of VBAC  Shorter stay in the hospital.  Avoidance of risks associated with cesarean delivery, such as: ? Surgical complications, such as opening of the incision or hernia in the  incision. ? Injury to other organs. ? Fever. This can occur if an infection develops after surgery. It can also occur as a reaction to the medicine given to make you numb during the surgery.  Less blood loss and need for blood transfusions.  Lower risk of blood clots and infection.  Shorter recovery.  Decreased risk for having to remove the uterus (hysterectomy).  Decreased risk for the placenta to completely or partially cover the opening of the uterus (placenta previa) with a future pregnancy.  Decrease risk in future labor and delivery. Risks of a VBAC  Tearing (rupture) of the uterus. This is occurs in less than 1% of VBACs. The risk of this happening is higher if: ? Steps are taken to begin the labor process (induce labor) or stimulate or strengthen contractions (augment labor). ? Medicine is used to soften (ripen) the cervix.  Having to remove the uterus (hysterectomy) if it ruptures. VBAC should not be done if:  The previous cesarean delivery was done with a vertical (classical) or T-shaped incision or you do not know what kind of incision was made.  You had a ruptured uterus.  You have had certain types of surgery on your uterus, such as removal of uterine fibroids. Ask your health care provider about other types of surgeries that prevent you from having a VBAC.  You have certain medical or childbirth (obstetrical) problems.  There are problems with the baby.  You have had two previous cesarean deliveries and no vaginal deliveries. Other facts to know about VBAC:  It   is safe to have an epidural anesthetic with VBAC.  It is safe to turn the baby from a breech position (attempt an external cephalic version).  It is safe to try a VBAC with twins.  VBAC may not be successful if your baby weights 8.8 lb (4 kg) or more. However, weight predictions are not always accurate and should not be used alone to decide if VBAC is right for you.  There is an increased failure rate  if the time between the cesarean delivery and VBAC is less than 19 months.  Your health care provider may advise against a VBAC if you have preeclampsia (high blood pressure, protein in the urine, and swelling of face and extremities).  VBAC is often successful if you previously gave birth vaginally.  VBAC is often successful when the labor starts spontaneously before the due date.  Delivering a baby through a VBAC is similar to having a normal spontaneous vaginal delivery. This information is not intended to replace advice given to you by your health care provider. Make sure you discuss any questions you have with your health care provider. Document Released: 06/30/2006 Document Revised: 06/15/2015 Document Reviewed: 08/06/2012 Elsevier Interactive Patient Education  2018 Elsevier Inc.  

## 2016-10-01 ENCOUNTER — Encounter (HOSPITAL_COMMUNITY): Payer: Self-pay | Admitting: *Deleted

## 2016-10-01 ENCOUNTER — Telehealth (HOSPITAL_COMMUNITY): Payer: Self-pay | Admitting: *Deleted

## 2016-10-01 ENCOUNTER — Other Ambulatory Visit: Payer: Self-pay | Admitting: Advanced Practice Midwife

## 2016-10-01 NOTE — Telephone Encounter (Signed)
Preadmission screen  

## 2016-10-03 ENCOUNTER — Inpatient Hospital Stay (HOSPITAL_COMMUNITY): Payer: Medicaid Other | Admitting: Anesthesiology

## 2016-10-03 ENCOUNTER — Encounter (HOSPITAL_COMMUNITY): Payer: Self-pay

## 2016-10-03 ENCOUNTER — Inpatient Hospital Stay (HOSPITAL_COMMUNITY)
Admission: RE | Admit: 2016-10-03 | Discharge: 2016-10-05 | DRG: 774 | Disposition: A | Discharge status: Home / Self Care | Payer: Medicaid Other | Source: Ambulatory Visit | Attending: Obstetrics and Gynecology | Admitting: Obstetrics and Gynecology

## 2016-10-03 DIAGNOSIS — Z3A40 40 weeks gestation of pregnancy: Secondary | ICD-10-CM

## 2016-10-03 DIAGNOSIS — Z87891 Personal history of nicotine dependence: Secondary | ICD-10-CM | POA: Diagnosis not present

## 2016-10-03 DIAGNOSIS — O3663X0 Maternal care for excessive fetal growth, third trimester, not applicable or unspecified: Secondary | ICD-10-CM | POA: Diagnosis present

## 2016-10-03 DIAGNOSIS — O99324 Drug use complicating childbirth: Secondary | ICD-10-CM | POA: Diagnosis present

## 2016-10-03 DIAGNOSIS — J45909 Unspecified asthma, uncomplicated: Secondary | ICD-10-CM | POA: Diagnosis present

## 2016-10-03 DIAGNOSIS — O10919 Unspecified pre-existing hypertension complicating pregnancy, unspecified trimester: Secondary | ICD-10-CM | POA: Diagnosis present

## 2016-10-03 DIAGNOSIS — F1290 Cannabis use, unspecified, uncomplicated: Secondary | ICD-10-CM | POA: Diagnosis present

## 2016-10-03 DIAGNOSIS — O9952 Diseases of the respiratory system complicating childbirth: Secondary | ICD-10-CM | POA: Diagnosis present

## 2016-10-03 DIAGNOSIS — O99314 Alcohol use complicating childbirth: Secondary | ICD-10-CM | POA: Diagnosis present

## 2016-10-03 DIAGNOSIS — O1002 Pre-existing essential hypertension complicating childbirth: Secondary | ICD-10-CM | POA: Diagnosis present

## 2016-10-03 DIAGNOSIS — O1092 Unspecified pre-existing hypertension complicating childbirth: Secondary | ICD-10-CM | POA: Diagnosis not present

## 2016-10-03 DIAGNOSIS — K219 Gastro-esophageal reflux disease without esophagitis: Secondary | ICD-10-CM | POA: Diagnosis present

## 2016-10-03 DIAGNOSIS — O9962 Diseases of the digestive system complicating childbirth: Secondary | ICD-10-CM | POA: Diagnosis present

## 2016-10-03 DIAGNOSIS — O34211 Maternal care for low transverse scar from previous cesarean delivery: Secondary | ICD-10-CM | POA: Diagnosis present

## 2016-10-03 DIAGNOSIS — Z7982 Long term (current) use of aspirin: Secondary | ICD-10-CM

## 2016-10-03 DIAGNOSIS — O34219 Maternal care for unspecified type scar from previous cesarean delivery: Secondary | ICD-10-CM

## 2016-10-03 HISTORY — DX: Postpartum depression: F53.0

## 2016-10-03 HISTORY — DX: Other mental disorders complicating the puerperium: O99.345

## 2016-10-03 HISTORY — DX: Carpal tunnel syndrome, unspecified upper limb: G56.00

## 2016-10-03 HISTORY — DX: Post-traumatic stress disorder, unspecified: F43.10

## 2016-10-03 HISTORY — DX: Anxiety disorder, unspecified: F41.9

## 2016-10-03 LAB — TYPE AND SCREEN
ABO/RH(D): A POS
ANTIBODY SCREEN: NEGATIVE

## 2016-10-03 LAB — CBC
HCT: 34.6 % — ABNORMAL LOW (ref 36.0–46.0)
HEMOGLOBIN: 12.3 g/dL (ref 12.0–15.0)
MCH: 30 pg (ref 26.0–34.0)
MCHC: 35.5 g/dL (ref 30.0–36.0)
MCV: 84.4 fL (ref 78.0–100.0)
PLATELETS: 194 10*3/uL (ref 150–400)
RBC: 4.1 MIL/uL (ref 3.87–5.11)
RDW: 13.8 % (ref 11.5–15.5)
WBC: 8.3 10*3/uL (ref 4.0–10.5)

## 2016-10-03 LAB — RPR: RPR: NONREACTIVE

## 2016-10-03 LAB — ABO/RH: ABO/RH(D): A POS

## 2016-10-03 MED ORDER — LIDOCAINE HCL (PF) 1 % IJ SOLN
INTRAMUSCULAR | Status: DC | PRN
  Administered 2016-10-03 (×2): 4 mL via EPIDURAL

## 2016-10-03 MED ORDER — SOD CITRATE-CITRIC ACID 500-334 MG/5ML PO SOLN: 30.0000 mL | ORAL | Status: DC | PRN

## 2016-10-03 MED ORDER — OXYCODONE-ACETAMINOPHEN 5-325 MG PO TABS: 2.0000 | ORAL_TABLET | ORAL | Status: DC | PRN

## 2016-10-03 MED ORDER — TERBUTALINE SULFATE 1 MG/ML IJ SOLN
0.2500 mg | Freq: Once | INTRAMUSCULAR | Status: DC | PRN
  Filled 2016-10-03: qty 1

## 2016-10-03 MED ORDER — FENTANYL 2.5 MCG/ML BUPIVACAINE 1/10 % EPIDURAL INFUSION (WH - ANES)
14.0000 mL/h | INTRAMUSCULAR | Status: DC | PRN
Start: 2016-10-03 — End: 2016-10-04
  Administered 2016-10-03: 14 mL/h via EPIDURAL
  Filled 2016-10-03: qty 100

## 2016-10-03 MED ORDER — PHENYLEPHRINE 40 MCG/ML (10ML) SYRINGE FOR IV PUSH (FOR BLOOD PRESSURE SUPPORT)
80.0000 ug | PREFILLED_SYRINGE | INTRAVENOUS | Status: AC | PRN
  Administered 2016-10-03 (×3): 80 ug via INTRAVENOUS
  Filled 2016-10-03 (×3): qty 10

## 2016-10-03 MED ORDER — LACTATED RINGERS IV SOLN
500.0000 mL | Freq: Once | INTRAVENOUS | Status: AC
  Administered 2016-10-03: 500 mL via INTRAVENOUS

## 2016-10-03 MED ORDER — OXYCODONE-ACETAMINOPHEN 5-325 MG PO TABS: 1.0000 | ORAL_TABLET | ORAL | Status: DC | PRN

## 2016-10-03 MED ORDER — PHENYLEPHRINE 40 MCG/ML (10ML) SYRINGE FOR IV PUSH (FOR BLOOD PRESSURE SUPPORT)
80.0000 ug | PREFILLED_SYRINGE | INTRAVENOUS | Status: AC | PRN
  Administered 2016-10-03 (×3): 80 ug via INTRAVENOUS

## 2016-10-03 MED ORDER — OXYTOCIN 40 UNITS IN LACTATED RINGERS INFUSION - SIMPLE MED
2.5000 [IU]/h | INTRAVENOUS | Status: DC
  Administered 2016-10-03: 2.5 [IU]/h via INTRAVENOUS
  Filled 2016-10-03: qty 1000

## 2016-10-03 MED ORDER — ACETAMINOPHEN 325 MG PO TABS: 650.0000 mg | ORAL_TABLET | ORAL | Status: DC | PRN

## 2016-10-03 MED ORDER — DIPHENHYDRAMINE HCL 50 MG/ML IJ SOLN: 12.5000 mg | INTRAMUSCULAR | Status: DC | PRN

## 2016-10-03 MED ORDER — OXYTOCIN 40 UNITS IN LACTATED RINGERS INFUSION - SIMPLE MED
1.0000 m[IU]/min | INTRAVENOUS | Status: DC
  Administered 2016-10-03: 2 m[IU]/min via INTRAVENOUS

## 2016-10-03 MED ORDER — LACTATED RINGERS IV SOLN
500.0000 mL | INTRAVENOUS | Status: DC | PRN
  Administered 2016-10-03 (×2): 500 mL via INTRAVENOUS

## 2016-10-03 MED ORDER — ONDANSETRON HCL 4 MG/2ML IJ SOLN: 4.0000 mg | Freq: Four times a day (QID) | INTRAMUSCULAR | Status: DC | PRN

## 2016-10-03 MED ORDER — FENTANYL CITRATE (PF) 100 MCG/2ML IJ SOLN
100.0000 ug | INTRAMUSCULAR | Status: DC | PRN
  Administered 2016-10-03 (×2): 100 ug via INTRAVENOUS
  Filled 2016-10-03 (×2): qty 2

## 2016-10-03 MED ORDER — EPHEDRINE 5 MG/ML INJ
10.0000 mg | INTRAVENOUS | Status: DC | PRN
  Filled 2016-10-03: qty 2
  Filled 2016-10-03: qty 4

## 2016-10-03 MED ORDER — OXYTOCIN BOLUS FROM INFUSION
500.0000 mL | Freq: Once | INTRAVENOUS | Status: AC
  Administered 2016-10-03: 500 mL via INTRAVENOUS

## 2016-10-03 MED ORDER — LACTATED RINGERS IV SOLN
INTRAVENOUS | Status: DC
  Administered 2016-10-03 (×3): via INTRAVENOUS

## 2016-10-03 MED ORDER — LIDOCAINE HCL (PF) 1 % IJ SOLN
30.0000 mL | INTRAMUSCULAR | Status: DC | PRN
  Filled 2016-10-03: qty 30

## 2016-10-03 MED ORDER — EPHEDRINE 5 MG/ML INJ
10.0000 mg | INTRAVENOUS | Status: DC | PRN
  Filled 2016-10-03: qty 2

## 2016-10-03 NOTE — Progress Notes (Signed)
FHR cat 1. Pitocin was d/c'd at 1730 after a bradycardia (hypotension after epidural) and never restarted.  Pt no longer contracting regularly.  Cx 5/70/-2.  Will restart pitocon

## 2016-10-03 NOTE — Anesthesia Preprocedure Evaluation (Signed)
Anesthesia Evaluation  Patient identified by MRN, date of birth, ID band Patient awake    Reviewed: Allergy & Precautions, Patient's Chart, lab work & pertinent test results  Airway Mallampati: II  TM Distance: >3 FB Neck ROM: Full    Dental no notable dental hx. (+) Teeth Intact   Pulmonary asthma , former smoker,    Pulmonary exam normal        Cardiovascular hypertension, Normal cardiovascular exam Rhythm:Regular Rate:Normal     Neuro/Psych PSYCHIATRIC DISORDERS Anxiety Depression  Neuromuscular disease    GI/Hepatic GERD  Medicated and Controlled,(+)     substance abuse  marijuana use,   Endo/Other  negative endocrine ROS  Renal/GU negative Renal ROS  negative genitourinary   Musculoskeletal negative musculoskeletal ROS (+)   Abdominal   Peds  Hematology negative hematology ROS (+)   Anesthesia Other Findings   Reproductive/Obstetrics (+) Pregnancy                             Anesthesia Physical Anesthesia Plan  ASA: II  Anesthesia Plan: Epidural   Post-op Pain Management:    Induction:   PONV Risk Score and Plan:   Airway Management Planned: Natural Airway  Additional Equipment:   Intra-op Plan:   Post-operative Plan:   Informed Consent: I have reviewed the patients History and Physical, chart, labs and discussed the procedure including the risks, benefits and alternatives for the proposed anesthesia with the patient or authorized representative who has indicated his/her understanding and acceptance.     Plan Discussed with:   Anesthesia Plan Comments:         Anesthesia Quick Evaluation

## 2016-10-03 NOTE — Anesthesia Procedure Notes (Signed)
Epidural Patient location during procedure: OB Start time: 10/03/2016 5:35 PM  Staffing Anesthesiologist: Mal AmabileFOSTER, Layne Lebon Performed: anesthesiologist   Preanesthetic Checklist Completed: patient identified, site marked, surgical consent, pre-op evaluation, timeout performed, IV checked, risks and benefits discussed and monitors and equipment checked  Epidural Patient position: sitting Prep: site prepped and draped and DuraPrep Patient monitoring: continuous pulse ox and blood pressure Approach: midline Location: L3-L4 Injection technique: LOR air  Needle:  Needle type: Tuohy  Needle gauge: 17 G Needle length: 9 cm and 9 Needle insertion depth: 5 cm cm Catheter type: closed end flexible Catheter size: 19 Gauge Catheter at skin depth: 10 cm Test dose: negative and Other  Assessment Events: blood not aspirated, injection not painful, no injection resistance, negative IV test and no paresthesia  Additional Notes Patient identified. Risks and benefits discussed including failed block, incomplete  Pain control, post dural puncture headache, nerve damage, paralysis, blood pressure Changes, nausea, vomiting, reactions to medications-both toxic and allergic and post Partum back pain. All questions were answered. Patient expressed understanding and wished to proceed. Sterile technique was used throughout procedure. Epidural site was Dressed with sterile barrier dressing. No paresthesias, signs of intravascular injection Or signs of intrathecal spread were encountered.  Patient was more comfortable after the epidural was dosed. Please see RN's note for documentation of vital signs and FHR which are stable.

## 2016-10-03 NOTE — Progress Notes (Signed)
Labor Progress Note  S: Patient seen & examined for progress of labor. Patient very uncomfortable, having frequent contractions.  Requesting epidural.  O: BP (!) 105/58   Pulse 79   Temp 98.4 F (36.9 C) (Oral)   Resp 20   Ht 5\' 6"  (1.676 m)   Wt 78.5 kg (173 lb)   LMP 01/05/2016 (Approximate)   SpO2 99%   BMI 27.92 kg/m   FHT: 140bpm, mod var, +accels, no decels TOCO: q2-203min, patient looks comfortable during contractions  CVE: Dilation: 2 Effacement (%): 60 Cervical Position: Posterior Station: -1 Presentation: Vertex Exam by:: Dr. Frances FurbishWinfrey   A&P: 31 y.o. [id number] 6572w0d here for IOL d/t cHTN.  Will place foley bulb after epidural in place.   Currently on 10 milli-units of pitocin Continue induction Anticipate SVD  Lezlie OctaveAmanda Winfrey, MD [facility] Resident PGY-1 10/03/2016 5:48 PM

## 2016-10-03 NOTE — H&P (Signed)
LABOR ADMISSION HISTORY AND PHYSICAL  Selena Haynes is a 31 y.o. female G3P2002 with IUP at [redacted]w[redacted]d by LMP c/w 7wk U/S presenting for IOL for TOLAC, cHTN. She reports +FMs, No LOF, no VB, no blurry vision, no headaches, no peripheral edema, and no RUQ pain.  She plans on breast feeding. She requests Nexplanon or Nuvaring for birth control.  Dating: By LMP c/w 7wk U/S --->  Estimated Date of Delivery: 10/03/16  Sono:   , CWD, normal anatomy, cephalic presentation, 3949g, >76% EFW  Patient initiated prenatal care at [redacted]w[redacted]d weeks at [facility]  Prenatal History/Complications: THC, alcohol use during pregnancy LGA fetus, AC >97%ile TOLAC cHTN Past Medical History: Past Medical History:  Diagnosis Date  . Asthma   . Hypertension     Past Surgical History: Past Surgical History:  Procedure Laterality Date  . CESAREAN SECTION      Obstetrical History: OB History    Gravida Para Term Preterm AB Living   SAB TAB Ectopic Multiple Live Births           2      Social History: Social History   Social History  . Marital status: Single    Spouse name: N/A  . Number of children: N/A  . Years of education: N/A   Social History Main Topics  . Smoking status: Former Smoker    Packs/day: 0.50    Years: 15.00    Types: Cigarettes    Quit date: 02/22/2016  . Smokeless tobacco: Former Neurosurgeon  . Alcohol use No  . Drug use: Yes    Types: Marijuana     Comment: last used Marijuana in February 2018  . Sexual activity: Yes    Birth control/ protection: None   Other Topics Concern  . None   Social History Narrative  . None    Family History: Family History  Problem Relation Age of Onset  . Asthma Sister   . Asthma Brother   . Hypertension Maternal Grandmother   . Diabetes Maternal Grandmother   . Diabetes Mother   . Hypertension Maternal Aunt   . Diabetes Maternal Uncle     Allergies: No Known Allergies  Prescriptions Prior to Admission  Medication Sig  Dispense Refill Last Dose  . albuterol (PROVENTIL HFA;VENTOLIN HFA) 108 (90 Base) MCG/ACT inhaler Inhale 2 puffs into the lungs every 6 (six) hours as needed for wheezing or shortness of breath. 1 Inhaler 11 Taking  . aspirin EC 81 MG tablet Take 1 tablet (81 mg total) by mouth daily. 30 tablet 10 Taking  . budesonide-formoterol (SYMBICORT) 160-4.5 MCG/ACT inhaler Inhale 2 puffs into the lungs 2 (two) times daily. 1 Inhaler 11 Taking  . cetirizine (ZYRTEC) 10 MG tablet Take 10 mg by mouth daily.   Taking  . hydrOXYzine (VISTARIL) 25 MG capsule Take 25 mg by mouth 3 (three) times daily as needed.   Taking  . pantoprazole (PROTONIX) 40 MG tablet Take 1 tablet (40 mg total) by mouth daily. 30 tablet 2 Taking  . Prenatal Vit-Fe Fumarate-FA (PRENATAL COMPLETE) 14-0.4 MG TABS Take 1 tablet by mouth daily. 60 each 0 Taking  . promethazine (PHENERGAN) 12.5 MG tablet Take 1 tablet (12.5 mg total) by mouth every 6 (six) hours as needed for nausea or vomiting. 30 tablet 2 Taking  . ranitidine (ZANTAC) 150 MG tablet Take 150 mg by mouth 2 (two) times daily as needed.    Taking  Review of Systems   All systems reviewed and negative except as stated in HPI  Blood pressure 122/64, pulse 98, temperature 97.6 F (36.4 C), temperature source Oral, resp. rate 18, height 5\' 6"  (1.676 m), weight 78.5 kg (173 lb), last menstrual period 01/05/2016. General appearance: alert, cooperative, appears stated age and no distress Lungs: normal work of breathing Extremities: Homans sign is negative, no sign of DVT Presentation: cephalic Fetal monitoringBaseline: 140 bpm, Variability: Fair (1-6 bpm), Accelerations: Reactive and Decelerations: Absent Uterine activityFrequency: Every 5 minutes Dilation: 1 cm Effacement (%): 50 Station: -3 Exam by: Selena Haynes, CMN    Prenatal labs: ABO, Rh: --/--/A POS (01/27 1649) Antibody: Negative (06/25 0848) Rubella: Immune (06/25) RPR: Non Reactive (06/25 0848)  HBsAg:  Negative (06/25 0848)  HIV:   Non Reactive (06/25) GBS: Negative (08/27 1025)  GTT: Fasting- 82, 1 hr- 147, 2 hr- 92  Prenatal Transfer Tool  Maternal Diabetes: No Genetic Screening: Normal Maternal Ultrasounds/Referrals: Normal Fetal Ultrasounds or other Referrals:  None Maternal Substance Abuse:  Yes:  Type: Marijuana, Other:  Alcohol Significant Maternal Medications:  Meds include: Zantac Other: ASA Significant Maternal Lab Results: None   Patient Active Problem List   Diagnosis Date Noted  . Rhinitis, nonallergic, chronic 09/24/2016  . GERD without esophagitis 09/24/2016  . 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 Carpal tunnel syndrome, right upper limb 05/22/2016  . Asthma affecting pregnancy, antepartum 05/22/2016  . Anxiety disorder affecting pregnancy, antepartum 05/22/2016  . 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 Marijuana use 02/29/2016  . Supervision of high risk pregnancy, antepartum 02/26/2016  . Chronic hypertension affecting pregnancy 02/26/2016  . Previous cesarean delivery affecting pregnancy, antepartum 02/26/2016    Assessment: Selena Haynes is a 31 y.o. G3P2002 at 549w0d here for IOL for cHTN and LGA.  #Labor: unable to place foley bulb at this time d/t cervical position; will start pitocin 2x2 infusion.  Will not use cytotec d/t TOLAC #Pain: Epidural upon request #FWB: Cat 1 #ID: GBS neg #MOF: breast #MOC: Nexplanon vs. Nuvaring #Circ:  outpatient  Lezlie OctaveAmanda Winfrey, MD Family Medicine Resident PGY-1  10/03/2016, 8:51 AM  I confirm that I have verified the information documented in the resident's note and that I have also personally reperformed the physical exam and all medical decision making activities.  Selena Haynes, CNM 10/03/2016 12:35 PM

## 2016-10-03 NOTE — Anesthesia Pain Management Evaluation Note (Addendum)
  CRNA Pain Management Visit Note  Patient: Selena Haynes, 31 y.o., female  "Hello [facility]. We have an anesthesia team available at all times to provide care throughout the hospital, including epidural management and anesthesia for C-section. I don't know your plan for the delivery whether it a natural birth, water birth, IV sedation, nitrous supplementation, doula or epidural, but we want to meet your pain goals."   1.Was your pain managed to your expectations on prior hospitalizations?   No, patient had a bad experience with epidural in [place] that didn't work.  2.What is your expectation for pain management during this hospitalization?     Unsure, possibly  pain medications.  3.How can we help you reach that goal? Unsure  Record the patient's initial score and the patient's pain goal.   Pain: 4  Pain Goal: 6 [facility] wants you to be able to say your pain was always managed very well.  Kristene Liberati 10/03/2016

## 2016-10-04 ENCOUNTER — Encounter (HOSPITAL_COMMUNITY): Payer: Self-pay

## 2016-10-04 MED ORDER — METHYLERGONOVINE MALEATE 0.2 MG/ML IJ SOLN: 0.2000 mg | INTRAMUSCULAR | Status: DC | PRN

## 2016-10-04 MED ORDER — WITCH HAZEL-GLYCERIN EX PADS: 1.0000 "application " | MEDICATED_PAD | CUTANEOUS | Status: DC | PRN

## 2016-10-04 MED ORDER — HYDROXYZINE PAMOATE 25 MG PO CAPS
25.0000 mg | ORAL_CAPSULE | Freq: Three times a day (TID) | ORAL | Status: DC | PRN
  Filled 2016-10-04: qty 1

## 2016-10-04 MED ORDER — TETANUS-DIPHTH-ACELL PERTUSSIS 5-2.5-18.5 LF-MCG/0.5 IM SUSP: 0.5000 mL | Freq: Once | INTRAMUSCULAR | Status: DC

## 2016-10-04 MED ORDER — ONDANSETRON HCL 4 MG/2ML IJ SOLN
4.0000 mg | INTRAMUSCULAR | Status: DC | PRN
Start: 2016-10-04 — End: 2016-10-05

## 2016-10-04 MED ORDER — FERROUS SULFATE 325 (65 FE) MG PO TABS
325.0000 mg | ORAL_TABLET | Freq: Two times a day (BID) | ORAL | Status: DC
  Administered 2016-10-04 – 2016-10-05 (×3): 325 mg via ORAL
  Filled 2016-10-04 (×3): qty 1

## 2016-10-04 MED ORDER — HYDROCORTISONE 1 % EX CREA
1.0000 | TOPICAL_CREAM | Freq: Two times a day (BID) | CUTANEOUS | Status: DC | PRN
Start: 2016-10-04 — End: 2016-10-05
  Filled 2016-10-04: qty 28

## 2016-10-04 MED ORDER — DIBUCAINE 1 % RE OINT: 1.0000 "application " | TOPICAL_OINTMENT | RECTAL | Status: DC | PRN

## 2016-10-04 MED ORDER — PRENATAL MULTIVITAMIN CH
1.0000 | ORAL_TABLET | Freq: Every day | ORAL | Status: DC
  Administered 2016-10-04 – 2016-10-05 (×2): 1 via ORAL
  Filled 2016-10-04 (×2): qty 1

## 2016-10-04 MED ORDER — METHYLERGONOVINE MALEATE 0.2 MG PO TABS: 0.2000 mg | ORAL_TABLET | ORAL | Status: DC | PRN

## 2016-10-04 MED ORDER — ONDANSETRON HCL 4 MG PO TABS: 4.0000 mg | ORAL_TABLET | ORAL | Status: DC | PRN

## 2016-10-04 MED ORDER — ALBUTEROL SULFATE (2.5 MG/3ML) 0.083% IN NEBU: 3.0000 mL | INHALATION_SOLUTION | Freq: Four times a day (QID) | RESPIRATORY_TRACT | Status: DC | PRN

## 2016-10-04 MED ORDER — MEASLES, MUMPS & RUBELLA VAC ~~LOC~~ INJ
0.5000 mL | INJECTION | Freq: Once | SUBCUTANEOUS | Status: DC
  Filled 2016-10-04: qty 0.5

## 2016-10-04 MED ORDER — COCONUT OIL OIL
1.0000 "application " | TOPICAL_OIL | Status: DC | PRN
  Administered 2016-10-05: 1 via TOPICAL
  Filled 2016-10-04 (×2): qty 120

## 2016-10-04 MED ORDER — BENZOCAINE-MENTHOL 20-0.5 % EX AERO
1.0000 "application " | INHALATION_SPRAY | CUTANEOUS | Status: DC | PRN
  Administered 2016-10-04: 1 via TOPICAL
  Filled 2016-10-04: qty 56

## 2016-10-04 MED ORDER — DOCUSATE SODIUM 100 MG PO CAPS
100.0000 mg | ORAL_CAPSULE | Freq: Two times a day (BID) | ORAL | Status: DC
  Administered 2016-10-04 – 2016-10-05 (×3): 100 mg via ORAL
  Filled 2016-10-04 (×3): qty 1

## 2016-10-04 MED ORDER — OXYCODONE HCL 5 MG PO TABS: 10.0000 mg | ORAL_TABLET | ORAL | Status: DC | PRN

## 2016-10-04 MED ORDER — BISACODYL 10 MG RE SUPP
10.0000 mg | Freq: Every day | RECTAL | Status: DC | PRN
Start: 2016-10-04 — End: 2016-10-05

## 2016-10-04 MED ORDER — OXYCODONE HCL 5 MG PO TABS: 5.0000 mg | ORAL_TABLET | ORAL | Status: DC | PRN

## 2016-10-04 MED ORDER — OXYCODONE HCL 5 MG PO TABS
5.0000 mg | ORAL_TABLET | ORAL | Status: DC | PRN
  Administered 2016-10-04: 5 mg via ORAL
  Filled 2016-10-04: qty 1

## 2016-10-04 MED ORDER — ACETAMINOPHEN 325 MG PO TABS
650.0000 mg | ORAL_TABLET | ORAL | Status: DC | PRN
  Administered 2016-10-04 (×2): 650 mg via ORAL
  Filled 2016-10-04 (×2): qty 2

## 2016-10-04 MED ORDER — ZOLPIDEM TARTRATE 5 MG PO TABS: 5.0000 mg | ORAL_TABLET | Freq: Every evening | ORAL | Status: DC | PRN

## 2016-10-04 MED ORDER — IBUPROFEN 600 MG PO TABS
600.0000 mg | ORAL_TABLET | Freq: Four times a day (QID) | ORAL | Status: DC
  Administered 2016-10-04 – 2016-10-05 (×6): 600 mg via ORAL
  Filled 2016-10-04 (×6): qty 1

## 2016-10-04 MED ORDER — FLEET ENEMA 7-19 GM/118ML RE ENEM: 1.0000 | ENEMA | Freq: Every day | RECTAL | Status: DC | PRN

## 2016-10-04 MED ORDER — SIMETHICONE 80 MG PO CHEW: 80.0000 mg | CHEWABLE_TABLET | ORAL | Status: DC | PRN

## 2016-10-04 MED ORDER — DIPHENHYDRAMINE HCL 25 MG PO CAPS: 25.0000 mg | ORAL_CAPSULE | Freq: Four times a day (QID) | ORAL | Status: DC | PRN

## 2016-10-04 NOTE — Lactation Note (Signed)
This note was copied from a baby's chart. Lactation Consultation Note  Patient Name: Selena Haynes [id number] Date: 10/04/2016 Reason for consult: Follow-up assessment   P3, Baby 16 hours old.  Baby recently breastfed for 60 min. Mother re-latched baby in football hold.  Mother kept working with baby until he latched deep. Reminded her to compress breast during feeding to keep baby active. Mom encouraged to feed baby 8-12 times/24 hours and with feeding cues. Suggest mother call if she needs further assistance.  Maternal Data    Feeding Feeding Type: Breast Fed Length of feed: 60 min  LATCH Score Latch: Grasps breast easily, tongue down, lips flanged, rhythmical sucking.  Audible Swallowing: A few with stimulation  Type of Nipple: Everted at rest and after stimulation  Comfort (Breast/Nipple): Soft / non-tender  Hold (Positioning): Assistance needed to correctly position infant at breast and maintain latch.  LATCH Score: 8  Interventions Interventions: Assisted with latch;Breast compression  Lactation Tools Discussed/Used     Consult Status Consult Status: Follow-up Date: 10/05/16 Follow-up type: In-patient    Dahlia Byes [facility] 10/04/2016, 3:45 PM
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
## 2016-10-04 NOTE — Progress Notes (Signed)
Post Partum Day 1  Subjective:  Selena Haynes is a 31 y.o. G3P3003 [redacted]w[redacted]d s/p NSVD/VBAC.  No acute events overnight.  Pt denies problems with ambulating, voiding or po intake.  She denies nausea or vomiting.  Pain is well controlled.  She has had flatus. She has not had bowel movement.  Lochia Moderate.  Plan for birth control is NuvaRing vaginal inserts, or Nexplanon.  Method of Feeding: breast  Objective: BP (!) 97/49 (BP Location: Right Arm)   Pulse 89   Temp 98.9 F (37.2 C) (Axillary)   Resp 16   Ht  (1.676 m)   Wt 78.5 kg (173 lb)   LMP 01/05/2016 (Approximate)   SpO2 99%   Breastfeeding? Unknown   BMI 27.92 kg/m   Physical Exam:  General: alert, cooperative and no distress Lochia:normal flow Chest: no increased work of breathing Abdomen: soft, nontender, fundus firm at/below umbilicus Uterine Fundus: firm DVT Evaluation: No evidence of DVT seen on physical exam. Extremities: No edema   Recent Labs  10/03/16 0800  HGB 12.3  HCT 34.6*    Assessment/Plan:  ASSESSMENT: Selena Haynes is a 31 y.o. G3P3003 [redacted]w[redacted]d ppd #1 s/p NSVD doing well.   Plan for discharge tomorrow   LOS: 1 day   Amanda C. Frances Furbish, MD PGY-1, [facility] 10/04/2016 12:24 PM  CNM attestation Post Partum Day #1 I have seen and examined this patient and agree with above documentation in the resident's note.   Selena Haynes is a 31 y.o. [id number] s/p VBAC.  Pt denies problems with ambulating, voiding or po intake. Pain is well controlled.  Plan for birth control is NuvaRing vaginal inserts.  Method of Feeding: breast  PE:  BP (!) 97/49 (BP Location: Right Arm)   Pulse 89   Temp 98.9 F (37.2 C) (Axillary)   Resp 16   Ht  (1.676 m)   Wt 78.5 kg (173 lb)   LMP 01/05/2016 (Approximate)   SpO2 99%   Breastfeeding? Unknown   BMI 27.92 kg/m  Fundus firm  Plan for discharge: 10/05/16  Cam Hai, CNM 2:19 PM 10/04/2016

## 2016-10-04 NOTE — Progress Notes (Deleted)
Post Partum Day 1  Subjective:  Selena Haynes is a 31 y.o. G3P3003 [redacted]w[redacted]d s/p NSVD.  No acute events overnight.  Pt denies problems with ambulating, voiding or po intake.  She denies nausea or vomiting.  Pain is well controlled.  She has had flatus. She has not had bowel movement.  Lochia Moderate.  Plan for birth control is NuvaRing vaginal inserts, or Nexplanon.  Method of Feeding: breast  Objective: BP (!) 97/49 (BP Location: Right Arm)   Pulse 89   Temp 98.9 F (37.2 C) (Axillary)   Resp 16   Ht  (1.676 m)   Wt 78.5 kg (173 lb)   LMP 01/05/2016 (Approximate)   SpO2 99%   Breastfeeding? Unknown   BMI 27.92 kg/m   Physical Exam:  General: alert, cooperative and no distress Lochia:normal flow Chest: no increased work of breathing Abdomen: soft, nontender, fundus firm at/below umbilicus Uterine Fundus: firm DVT Evaluation: No evidence of DVT seen on physical exam. Extremities: No edema   Recent Labs  10/03/16 0800  HGB 12.3  HCT 34.6*    Assessment/Plan:  ASSESSMENT: Selena Haynes is a 31 y.o. G3P3003 [redacted]w[redacted]d ppd #1 s/p NSVD doing well.   Plan for discharge tomorrow   LOS: 1 day   Amanda C. Frances Furbish, MD PGY-1, [facility] 10/04/2016 9:06 AM

## 2016-10-04 NOTE — Anesthesia Postprocedure Evaluation (Signed)
Anesthesia Post Note  Patient: Selena Haynes  Procedure(s) Performed: * No procedures listed *     Patient location during evaluation: Mother Baby Anesthesia Type: Epidural Level of consciousness: awake Pain management: pain level controlled Vital Signs Assessment: post-procedure vital signs reviewed and stable Respiratory status: spontaneous breathing Cardiovascular status: stable Postop Assessment: no headache, epidural receding and patient able to bend at knees Anesthetic complications: no    Last Vitals:  Vitals:   10/04/16 0305 10/04/16 0640  BP: (!) 111/54 (!) 97/49  Pulse: 95 89  Resp: 18 16  Temp: 36.7 C 37.2 C  SpO2:      Last Pain:  Vitals:   10/04/16 0725  TempSrc:   PainSc: 4    Pain Goal:                 Edison Pace

## 2016-10-04 NOTE — Lactation Note (Signed)
This note was copied from a baby's chart. Lactation Consultation Note Mom's 3rd child. 1st child 33 yrs old, and 32 yrs old that she BF both for 3 months each. Denied difficulty. Mom states that her breast hasn't stopped producing BM since her first baby. Stated they didn't leak or get engorged, but she could always hand express milk. Mom has soft knotty breast w/short shaft med/large nipple. Easily everts and very compressible at this time. Noted after hand expressed breast softened, knots decreased. Breast slightly tender during hand expression.  Baby cues, but will not latch, or open mouth. Suckling on top lip and tongue. Attempted to chin tug and place nipple in mouth. Baby suckle 2 times then tongue thrust out. Explained to mom he hasn't figured out how to BF yet. Mom stated her other 2 children went straight to breast and fed w/o difficulty. Explained all children are different. Explained to mom baby is only 4 hrs old, he is cueing which is a good sign.  Discussed spoon feeding to interest in feeding. Hand expressed 10 ml easily. Baby est. Took 5 ml. Would tongue thrust, then start suckling on spoon.  Baby STS and outside of baby covered in blankets. After feeding wrapped in 2 blankets. Noted hands of baby jittery before feeding. Reported to RN, stated baby has been jittery. Blood sugar WDL. Encouraged mom to alert RN if jitteriness increases or difficulty waking. Mom encouraged to feed baby 8-12 times/24 hours and with feeding cues. Encouraged to wake if hasn't cued in 3 hrs for feeding. Alert RN if not feeding well. Educated newborn behavior, feeding habits, STS, I&O, cluster feeding supply and demand.  [facility] brochure given w/resources, support groups and LC services.  Patient Name: Selena Haynes [id number] Date: 10/04/2016 Reason for consult: Initial assessment   Maternal Data Has patient been taught Hand Expression?: Yes Does the patient have breastfeeding experience prior to this  delivery?: Yes  Feeding Feeding Type: Breast Milk Length of feed: 0 min  LATCH Score Latch: Too sleepy or reluctant, no latch achieved, no sucking elicited.  Audible Swallowing: None  Type of Nipple: Everted at rest and after stimulation  Comfort (Breast/Nipple): Soft / non-tender  Hold (Positioning): Full assist, staff holds infant at breast  LATCH Score: 4  Interventions Interventions: Breast feeding basics reviewed;Support pillows;Assisted with latch;Position options;Skin to skin;Expressed milk;Breast massage;Hand express;Breast compression;Adjust position  Lactation Tools Discussed/Used     Consult Status Consult Status: Follow-up Date: 10/04/16 Follow-up type: In-patient    Radhika Dershem, Diamond Nickel 10/04/2016, 3:07 AM

## 2016-10-05 MED ORDER — ACETAMINOPHEN 325 MG PO TABS: 650.0000 mg | ORAL_TABLET | ORAL | 0 refills | Status: DC | PRN

## 2016-10-05 MED ORDER — IBUPROFEN 600 MG PO TABS: 600.0000 mg | ORAL_TABLET | Freq: Four times a day (QID) | ORAL | 0 refills | Status: DC

## 2016-10-05 NOTE — Discharge Summary (Signed)
OB Discharge Summary     Patient Name: Selena Haynes DOB: Jan 17, 1986 MRN: [id number]  Date of admission: 10/03/2016 Delivering MD: KEYCorrie Dandy K   Date of discharge: 10/05/2016  Admitting diagnosis: INDUCTION Intrauterine pregnancy: [redacted]w[redacted]d     Secondary diagnosis:  Active Problems:   Chronic hypertension affecting pregnancy  Additional problems: alcohol and THC use during pregnancy     Discharge diagnosis: Term Pregnancy Delivered, VBAC and CHTN                                                                                                Post partum procedures:none  Augmentation: AROM, Pitocin and Foley Balloon  Complications: None  Hospital course:  Induction of Labor With Vaginal Delivery   31 y.o. yo G3P3003 at [redacted]w[redacted]d was admitted to the hospital 10/03/2016 for induction of labor.  Indication for induction: cHTN.  Patient had an uncomplicated labor course as follows: Membrane Rupture Time/Date: 8:29 PM ,10/03/2016   Intrapartum Procedures: Episiotomy: None [1]                                         Lacerations:  1st degree [2];Vaginal [6]  Patient had delivery of a Viable infant.  Information for the patient's newborn:  Ryian, Lynde [[id number]]  Delivery Method: VBAC   10/03/2016  Details of delivery can be found in separate delivery note.  Patient had a routine postpartum course. Patient is discharged home 10/05/16.  Physical exam  Vitals:   10/04/16 0305 10/04/16 0640 10/04/16 1705 10/05/16 0500  BP: (!) 111/54 (!) 97/49 102/60 112/61  Pulse: 95 89 86 79  Resp: Temp: 98 F (36.7 C) 98.9 F (37.2 C) 98.3 F (36.8 C) 98.2 F (36.8 C)  TempSrc: Oral Axillary Oral Oral  SpO2:   99%   Weight:      Height:       General: alert, cooperative and no distress Lochia: appropriate Uterine Fundus: firm Incision: N/A DVT Evaluation: No evidence of DVT seen on physical exam. No cords or calf tenderness. No significant calf/ankle edema. Labs: Lab  Results  Component Value Date   WBC 8.3 10/03/2016   HGB 12.3 10/03/2016   HCT 34.6 (L) 10/03/2016   MCV 84.4 10/03/2016   PLT 194 10/03/2016   CMP Latest Ref Rng & Units 04/22/2016  Glucose 65 - 99 mg/dL 782(N)  BUN 6 - 20 mg/dL 6  Creatinine 5.62 - 1.30 mg/dL 8.65  Sodium 784 - 696 mmol/L 136  Potassium 3.5 - 5.1 mmol/L 3.4(L)  Chloride 101 - 111 mmol/L 103  CO2 22 - 32 mmol/L 23  Calcium 8.9 - 10.3 mg/dL 9.4  Total Protein 6.5 - 8.1 g/dL 6.3(L)  Total Bilirubin 0.3 - 1.2 mg/dL 2.9(B)  Alkaline Phos 38 - 126 U/L 47  AST 15 - 41 U/L 20  ALT 14 - 54 U/L 15    Discharge instruction: per After Visit Summary and "Baby and Me Booklet".  After visit meds:  Allergies as of 10/05/2016   No Known Allergies     Medication List    STOP taking these medications   aspirin EC 81 MG tablet   budesonide-formoterol 160-4.5 MCG/ACT inhaler Commonly known as:  SYMBICORT   promethazine 12.5 MG tablet Commonly known as:  PHENERGAN     TAKE these medications   acetaminophen 325 MG tablet Commonly known as:  TYLENOL Take 2 tablets (650 mg total) by mouth every 4 (four) hours as needed (for pain scale < 4).   albuterol 108 (90 Base) MCG/ACT inhaler Commonly known as:  PROVENTIL HFA;VENTOLIN HFA Inhale 2 puffs into the lungs every 6 (six) hours as needed for wheezing or shortness of breath.   cetirizine 10 MG tablet Commonly known as:  ZYRTEC Take 10 mg by mouth daily as needed for allergies.   hydrocortisone cream 1 % Apply 1 application topically 2 (two) times daily as needed for itching. otc medication for itching   hydrOXYzine 25 MG capsule Commonly known as:  VISTARIL Take 25 mg by mouth 3 (three) times daily as needed for itching.   ibuprofen 600 MG tablet Commonly known as:  ADVIL,MOTRIN Take 1 tablet (600 mg total) by mouth every 6 (six) hours.   pantoprazole 40 MG tablet Commonly known as:  PROTONIX Take 1 tablet (40 mg total) by mouth daily.   PRENATAL COMPLETE  14-0.4 MG Tabs Take 1 tablet by mouth daily.   ranitidine 150 MG tablet Commonly known as:  ZANTAC Take 150 mg by mouth 2 (two) times daily as needed for heartburn.            Discharge Care Instructions        Start     Ordered   10/05/16 0000  acetaminophen (TYLENOL) 325 MG tablet  Every 4 hours PRN     10/05/16 0749   10/05/16 0000  ibuprofen (ADVIL,MOTRIN) 600 MG tablet  Every 6 hours     10/05/16 0749   10/03/16 0000  OB RESULT CONSOLE Group B Strep    Comments:  This external order was created through the Results Console.   10/03/16 1610      Diet: routine diet  Activity: Advance as tolerated. Pelvic rest for 6 weeks.   Outpatient follow up:4 weeks Follow up Appt: Future Appointments Date Time Provider Department Center  11/04/2016 10:40 AM Levie Heritage, DO [facility]  12/24/2016 1:45 PM Sherene Sires, Charlaine Dalton, MD [facility] None   Follow up Visit:No Follow-up on file.  Postpartum contraception: Nexplanon or Nuvaring  Newborn Data: Live born female  Birth Weight: 8 lb 2.2 oz (3691 g) APGAR: 7, 8  Baby Feeding: Breast Disposition:home with mother   10/05/2016 Lennox Solders, MD  CNM attestation I have seen and examined this patient and agree with above documentation in the resident's note.   Selena Haynes is a 31 y.o. [id number] s/p VBAC.   Pain is well controlled.  Plan for birth control is Nexplanon.  Method of Feeding: breast  PE:  BP 112/61 (BP Location: Left Arm)   Pulse 79   Temp 98.2 F (36.8 C) (Oral)   Resp 18   Ht  (1.676 m)   Wt 78.5 kg (173 lb)   LMP 01/05/2016 (Approximate)   SpO2 99%   Breastfeeding? Unknown   BMI 27.92 kg/m  Fundus firm   Recent Labs  10/03/16 0800  HGB 12.3  HCT 34.6*     Plan: discharge today - postpartum care discussed - f/u  clinic in 4 weeks for postpartum visit - pt w/ questionable cHTN dx- states she lost approx 30lbs between dx and prepreg; has had great BPs while an inpt and has not  required meds  Cam Hai, CNM 9:14 AM 10/05/2016

## 2016-10-05 NOTE — Lactation Note (Addendum)
This note was copied from a baby's chart. Lactation Consultation Note: Mother reports that infant is feeding well. She confirms that infant has been cluster feeding for more that 60 mins at a time. Mother reports that her nipples are sore. She does have bilateral positional strips. Mother advised to hand express and apply colostrum to nipples. Mother is active with WIC . She was given a harmony hand pump with instructions to post pump as needed. Mother was fit with a #27 flange. Advised mother to continue to cue base feed infant and feed at least 8-12 times in 24 hours. Discussed treatment and prevention of engorgement and S/S of Mastitis. Mother is aware of available LC number if needs lactation assistance.Mother receptive to all teaching.   Patient Name: Boy Arrow Emmerich [id number] Date: 10/05/2016 Reason for consult: Follow-up assessment   Maternal Data    Feeding    LATCH Score                   Interventions    Lactation Tools Discussed/Used     Consult Status      Michel Bickers 10/05/2016, 10:51 AM

## 2016-10-07 ENCOUNTER — Other Ambulatory Visit: Payer: Self-pay

## 2016-10-07 ENCOUNTER — Encounter: Payer: Self-pay | Admitting: Obstetrics & Gynecology

## 2016-10-11 ENCOUNTER — Inpatient Hospital Stay (HOSPITAL_COMMUNITY): Payer: Medicaid Other

## 2016-11-04 ENCOUNTER — Ambulatory Visit (INDEPENDENT_AMBULATORY_CARE_PROVIDER_SITE_OTHER): Payer: Medicaid Other | Admitting: Family Medicine

## 2016-11-04 ENCOUNTER — Other Ambulatory Visit (HOSPITAL_COMMUNITY)
Admission: RE | Admit: 2016-11-04 | Discharge: 2016-11-04 | Disposition: A | Discharge status: Home / Self Care | Payer: Medicaid Other | Source: Ambulatory Visit | Attending: Family Medicine | Admitting: Family Medicine

## 2016-11-04 ENCOUNTER — Encounter: Payer: Self-pay | Admitting: Family Medicine

## 2016-11-04 VITALS — BP 108/63 | HR 91 | Ht 66.0 in | Wt 144.7 lb

## 2016-11-04 DIAGNOSIS — O99345 Other mental disorders complicating the puerperium: Principal | ICD-10-CM

## 2016-11-04 DIAGNOSIS — N898 Other specified noninflammatory disorders of vagina: Secondary | ICD-10-CM | POA: Diagnosis not present

## 2016-11-04 DIAGNOSIS — Z3042 Encounter for surveillance of injectable contraceptive: Secondary | ICD-10-CM

## 2016-11-04 DIAGNOSIS — F53 Postpartum depression: Secondary | ICD-10-CM | POA: Diagnosis not present

## 2016-11-04 DIAGNOSIS — Z30013 Encounter for initial prescription of injectable contraceptive: Secondary | ICD-10-CM

## 2016-11-04 MED ORDER — MEDROXYPROGESTERONE ACETATE 150 MG/ML IM SUSP
150.0000 mg | Freq: Once | INTRAMUSCULAR | Status: AC
  Administered 2016-11-04: 150 mg via INTRAMUSCULAR

## 2016-11-04 NOTE — Addendum Note (Signed)
Addended by: Raynald Blend L on: 11/04/2016 12:00 PM   Modules accepted: Orders

## 2016-11-04 NOTE — Progress Notes (Signed)
Subjective:     Selena Haynes is a 31 y.o. female who presents for a postpartum visit. She is 4 weeks postpartum following a spontaneous vaginal delivery. I have fully reviewed the prenatal and intrapartum course. The delivery was at 40 gestational weeks. Outcome: spontaneous vaginal delivery. Anesthesia: epidural. Postpartum course has been normal. Baby's course has been normal. Baby is feeding by breast. Bleeding pink. Bowel function is abnormal- constipation. Bladder function is normal. Patient is not sexually active. Contraception method is Depo-Provera injections. Postpartum depression screening: positive.  The following portions of the patient's history were reviewed and updated as appropriate: allergies, current medications, past family history, past medical history, past social history, past surgical history and problem list.  Review of Systems Pertinent items are noted in HPI.   Objective:    There were no vitals taken for this visit.  General:  alert, cooperative and no distress  Lungs: clear to auscultation bilaterally  Heart:  regular rate and rhythm, S1, S2 normal, no murmur, click, rub or gallop  Abdomen: soft, non-tender; bowel sounds normal; no masses,  no organomegaly   Vulva:  normal  Vagina: suture still visible at introitus. Appears well healed. Vaginal discharge seen  Cervix:  multiparous appearance        Assessment:     normal postpartum exam. Pap smear not done at today's visit.   Plan:    1. Contraception: Depo-Provera injections 2. Vaginal culture done 3. Follow up in: 1 year or as needed.

## 2016-11-04 NOTE — Progress Notes (Signed)
Elevated [place] screen. Patient and/or legal guardian verbally consented to meet with Behavioral Health Clinician about presenting concerns.

## 2016-11-05 ENCOUNTER — Other Ambulatory Visit: Payer: Self-pay | Admitting: Family Medicine

## 2016-11-05 LAB — CERVICOVAGINAL ANCILLARY ONLY
Bacterial vaginitis: POSITIVE — AB
Candida vaginitis: NEGATIVE
Chlamydia: NEGATIVE
Neisseria Gonorrhea: NEGATIVE
TRICH (WINDOWPATH): NEGATIVE

## 2016-11-05 MED ORDER — METRONIDAZOLE 500 MG PO TABS: 500.0000 mg | ORAL_TABLET | Freq: Two times a day (BID) | ORAL | 0 refills | Status: DC

## 2016-11-13 ENCOUNTER — Ambulatory Visit: Payer: Self-pay

## 2016-12-24 ENCOUNTER — Ambulatory Visit: Payer: Self-pay | Admitting: Internal Medicine

## 2017-01-19 ENCOUNTER — Emergency Department (HOSPITAL_BASED_OUTPATIENT_CLINIC_OR_DEPARTMENT_OTHER)
Admission: EM | Admit: 2017-01-19 | Discharge: 2017-01-19 | Disposition: A | Discharge status: Home / Self Care | Payer: Medicaid Other | Attending: Emergency Medicine | Admitting: Emergency Medicine

## 2017-01-19 ENCOUNTER — Emergency Department (HOSPITAL_BASED_OUTPATIENT_CLINIC_OR_DEPARTMENT_OTHER): Payer: Medicaid Other

## 2017-01-19 ENCOUNTER — Encounter (HOSPITAL_BASED_OUTPATIENT_CLINIC_OR_DEPARTMENT_OTHER): Payer: Self-pay | Admitting: Emergency Medicine

## 2017-01-19 ENCOUNTER — Other Ambulatory Visit: Payer: Self-pay

## 2017-01-19 DIAGNOSIS — M25511 Pain in right shoulder: Secondary | ICD-10-CM | POA: Insufficient documentation

## 2017-01-19 DIAGNOSIS — M25512 Pain in left shoulder: Secondary | ICD-10-CM | POA: Diagnosis not present

## 2017-01-19 DIAGNOSIS — Y92481 Parking lot as the place of occurrence of the external cause: Secondary | ICD-10-CM | POA: Insufficient documentation

## 2017-01-19 DIAGNOSIS — Y998 Other external cause status: Secondary | ICD-10-CM | POA: Insufficient documentation

## 2017-01-19 DIAGNOSIS — I10 Essential (primary) hypertension: Secondary | ICD-10-CM | POA: Insufficient documentation

## 2017-01-19 DIAGNOSIS — S060X0A Concussion without loss of consciousness, initial encounter: Secondary | ICD-10-CM | POA: Insufficient documentation

## 2017-01-19 DIAGNOSIS — S0990XA Unspecified injury of head, initial encounter: Secondary | ICD-10-CM | POA: Diagnosis present

## 2017-01-19 DIAGNOSIS — Y939 Activity, unspecified: Secondary | ICD-10-CM | POA: Insufficient documentation

## 2017-01-19 DIAGNOSIS — Z87891 Personal history of nicotine dependence: Secondary | ICD-10-CM | POA: Insufficient documentation

## 2017-01-19 DIAGNOSIS — J45909 Unspecified asthma, uncomplicated: Secondary | ICD-10-CM | POA: Diagnosis not present

## 2017-01-19 DIAGNOSIS — Z79899 Other long term (current) drug therapy: Secondary | ICD-10-CM | POA: Diagnosis not present

## 2017-01-19 LAB — PREGNANCY, URINE: Preg Test, Ur: NEGATIVE
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
## 2017-01-19 MED ORDER — ONDANSETRON 4 MG PO TBDP
4.0000 mg | ORAL_TABLET | Freq: Once | ORAL | Status: AC
  Administered 2017-01-19: 4 mg via ORAL

## 2017-01-19 MED ORDER — ONDANSETRON 4 MG PO TBDP
ORAL_TABLET | ORAL | Status: AC
  Filled 2017-01-19: qty 1

## 2017-01-19 NOTE — ED Notes (Signed)
ED Provider at bedside. 
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
## 2017-01-20 ENCOUNTER — Ambulatory Visit: Payer: Self-pay

## 2017-01-30 ENCOUNTER — Ambulatory Visit (INDEPENDENT_AMBULATORY_CARE_PROVIDER_SITE_OTHER): Payer: Medicaid Other | Admitting: General Practice

## 2017-01-30 VITALS — BP 111/59 | HR 80 | Ht 66.0 in | Wt 150.0 lb

## 2017-01-30 DIAGNOSIS — Z3042 Encounter for surveillance of injectable contraceptive: Secondary | ICD-10-CM | POA: Diagnosis present

## 2017-01-30 MED ORDER — MEDROXYPROGESTERONE ACETATE 150 MG/ML IM SUSP
150.0000 mg | Freq: Once | INTRAMUSCULAR | Status: AC
  Administered 2017-01-30: 150 mg via INTRAMUSCULAR

## 2017-01-30 NOTE — Progress Notes (Signed)
Depoprovera given

## 2017-04-17 ENCOUNTER — Ambulatory Visit: Payer: Self-pay

## 2017-05-27 ENCOUNTER — Encounter: Payer: Self-pay | Admitting: *Deleted

## 2017-06-19 ENCOUNTER — Ambulatory Visit (INDEPENDENT_AMBULATORY_CARE_PROVIDER_SITE_OTHER): Payer: Medicaid Other | Admitting: Family Medicine

## 2017-06-19 ENCOUNTER — Encounter: Payer: Self-pay | Admitting: Family Medicine

## 2017-06-19 VITALS — BP 125/75 | HR 75 | Ht 66.0 in | Wt 155.4 lb

## 2017-06-19 DIAGNOSIS — Z3202 Encounter for pregnancy test, result negative: Secondary | ICD-10-CM | POA: Diagnosis not present

## 2017-06-19 DIAGNOSIS — Z3042 Encounter for surveillance of injectable contraceptive: Secondary | ICD-10-CM | POA: Diagnosis not present

## 2017-06-19 LAB — POCT URINE PREGNANCY: PREG TEST UR: NEGATIVE

## 2017-06-19 MED ORDER — RANITIDINE HCL 150 MG PO TABS: 150.0000 mg | ORAL_TABLET | Freq: Two times a day (BID) | ORAL | 11 refills | Status: DC | PRN

## 2017-06-19 MED ORDER — MEDROXYPROGESTERONE ACETATE 150 MG/ML IM SUSP
150.0000 mg | Freq: Once | INTRAMUSCULAR | Status: AC
  Administered 2017-06-19: 150 mg via INTRAMUSCULAR

## 2017-06-19 NOTE — Progress Notes (Signed)
Patient not seen by me as not due for annual exam until after October.

## 2017-06-19 NOTE — Addendum Note (Signed)
Addended by: Judd Gaudier on: 06/19/2017 04:17 PM   Modules accepted: Orders

## 2017-07-14 ENCOUNTER — Other Ambulatory Visit: Payer: Self-pay | Admitting: Family Medicine

## 2017-09-04 ENCOUNTER — Ambulatory Visit: Payer: Self-pay

## 2018-01-09 ENCOUNTER — Encounter: Payer: Self-pay | Admitting: Internal Medicine

## 2018-01-09 ENCOUNTER — Ambulatory Visit (INDEPENDENT_AMBULATORY_CARE_PROVIDER_SITE_OTHER)
Admission: RE | Admit: 2018-01-09 | Discharge: 2018-01-09 | Disposition: A | Discharge status: Home / Self Care | Payer: Medicaid Other | Source: Ambulatory Visit | Attending: Pulmonary Disease | Admitting: Pulmonary Disease

## 2018-01-09 ENCOUNTER — Encounter: Payer: Self-pay | Admitting: Pulmonary Disease

## 2018-01-09 ENCOUNTER — Ambulatory Visit: Payer: Medicaid Other | Admitting: Pulmonary Disease

## 2018-01-09 VITALS — BP 108/60 | HR 99 | Temp 98.5°F | Ht 67.25 in | Wt 145.6 lb

## 2018-01-09 DIAGNOSIS — B370 Candidal stomatitis: Secondary | ICD-10-CM | POA: Diagnosis not present

## 2018-01-09 DIAGNOSIS — J45909 Unspecified asthma, uncomplicated: Secondary | ICD-10-CM

## 2018-01-09 DIAGNOSIS — J209 Acute bronchitis, unspecified: Secondary | ICD-10-CM

## 2018-01-09 DIAGNOSIS — J310 Chronic rhinitis: Secondary | ICD-10-CM | POA: Diagnosis not present

## 2018-01-09 MED ORDER — BUDESONIDE-FORMOTEROL FUMARATE 160-4.5 MCG/ACT IN AERO: 2.0000 | INHALATION_SPRAY | Freq: Two times a day (BID) | RESPIRATORY_TRACT | 1 refills | Status: DC

## 2018-01-09 MED ORDER — PREDNISONE 10 MG PO TABS
ORAL_TABLET | ORAL | 0 refills | Status: DC
Start: 2018-01-09 — End: 2018-01-29

## 2018-01-09 MED ORDER — ALBUTEROL SULFATE HFA 108 (90 BASE) MCG/ACT IN AERS: INHALATION_SPRAY | RESPIRATORY_TRACT | 11 refills | Status: DC

## 2018-01-09 MED ORDER — NYSTATIN 100000 UNIT/ML MT SUSP
5.0000 mL | Freq: Four times a day (QID) | OROMUCOSAL | 0 refills | Status: DC
Start: 2018-01-09 — End: 2019-02-04

## 2018-01-09 MED ORDER — AMOXICILLIN-POT CLAVULANATE 875-125 MG PO TABS: 1.0000 | ORAL_TABLET | Freq: Two times a day (BID) | ORAL | 0 refills | Status: DC

## 2018-01-09 NOTE — Assessment & Plan Note (Signed)
  Continue zyrtec daily Start flonase 1 spray each nostril  Start nasal saline rinses daily   Follow up in 2-4 weeks with Dr. Sherene SiresWert to ensure symptoms are resolving

## 2018-01-09 NOTE — Progress Notes (Signed)
Chart and office note reviewed in detail  > agree with a/p as outlined    

## 2018-01-09 NOTE — Assessment & Plan Note (Signed)
Chest xray   Augmentin >>> Take 1 875-125 mg tablet every 12 hours for the next 7 days >>> Take with food  Prednisone 10mg  tablet  >>>4 tabs for 2 days, then 3 tabs for 2 days, 2 tabs for 2 days, then 1 tab for 2 days, then stop >>>take with food  >>>take in the morning   Continue Symbicort 160 >>> 2 puffs in the morning right when you wake up, rinse out your mouth after use, 12 hours later 2 puffs, rinse after use >>> Take this daily, no matter what >>> This is not a rescue inhaler   Follow up in 2-4 weeks with Dr. Sherene SiresWert to ensure symptoms are resolving

## 2018-01-09 NOTE — Assessment & Plan Note (Signed)
Rinse mouth after Symbicort use Nystatin rinse for oral thrush

## 2018-01-09 NOTE — Progress Notes (Signed)
 @Patient  ID: Selena Haynes, female    DOB: September 03, 1985, 32 y.o.   MRN: [id number]  Chief Complaint  Patient presents with  . Acute Visit    Cough, fatigue    Referring provider: Augustine Radaroberson, Kristina, MD  HPI:  32 year old female former smoker followed in our office for asthma  Smoker/ Smoking History: Former smoker.  Quit 2018.  7-1/2 pack years. Maintenance: Symbicort 160 Pt of: Dr. Sherene SiresWert  Recent [facility] Encounters:   Last seen September/2018 by Dr. Sherene SiresWert  01/09/2018  - Visit   32 year old female patient presenting today for acute visit.  Patient reporting that for the last 3 to 4 weeks she is had increased shortness of breath, fatigue, dry cough that is wet sounding.  Patient reports she was febrile for the first 1 to 2 weeks of the illness but has not had any fevers over the last 2 weeks.  Patient was recently seen by her primary care doctor last week and was diagnosed with pneumonia without chest x-ray.  Patient was given a course of steroids.  Patient has restarted using her Symbicort 160 over this course of illness this is not helping.  Patient continues to use her rescue inhaler 3-4 times a day.  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   Patient is concerned that she does not feel like her symptoms are improving.  Patient wanted present today for further evaluation.  Patient has previous sick contacts of flu exposure 4 weeks ago as well as family has been sick over the past couple of weeks.  Patient denies recent intercourse.  Patient does not have any current birth control.  Patient reports she has not had intercourse since she was 6 months pregnant.  Denies any recent pregnancy chance.       FENO:  No results found for: NITRICOXIDE  PFT: No flowsheet data found.  Imaging: Dg Chest 2 View  Result Date: 01/09/2018 CLINICAL DATA:  Shortness of breath with cough and congestion  EXAM: CHEST - 2 VIEW COMPARISON:  None. FINDINGS: Lungs are clear. Heart size and pulmonary vascularity are normal. No adenopathy. No bone lesions. IMPRESSION: No edema or consolidation. Electronically Signed   By: Bretta BangWilliam  Woodruff III M.D.   On: 01/09/2018 15:13      Specialty Problems      Pulmonary Problems   Rhinitis, nonallergic, chronic    Trial of breathe right strips/ nasal saline       Bronchitis with asthma, acute      No Known Allergies  Immunization History  Administered Date(s) Administered  . Tdap 07/15/2016   Patient needs flu vaccine when stable  Past Medical History:  Diagnosis Date  . Anxiety   . Asthma   . Carpal tunnel syndrome   . Hypertension   . Post traumatic stress disorder (PTSD)   . Postpartum depression     Tobacco History: Social History   Tobacco Use  Smoking Status Former Smoker  . Packs/day: 0.50  . Years: 15.00  . Pack years: 7.50  . Types: Cigarettes  . Last attempt to quit: 02/22/2016  . Years since quitting: 1.8  Smokeless Tobacco Former Financial plannerUser   Counseling given: Yes  Continue to not smoke  Outpatient Encounter Medications as of 01/09/2018  Medication Sig  . cetirizine (ZYRTEC) 10 MG tablet Take 10 mg by mouth daily as needed for allergies.   Marland Kitchen. PROAIR HFA 108 (90 Base) MCG/ACT  inhaler INHALE 2 PUFFS INTO THE LUNGS EVERY 6 (SIX) HOURS AS NEEDED FOR WHEEZING OR SHORTNESS OF BREATH.  . ranitidine (ZANTAC) 150 MG tablet Take 1 tablet (150 mg total) by mouth 2 (two) times daily as needed for heartburn.  Marland Kitchen acetaminophen (TYLENOL) 325 MG tablet Take 2 tablets (650 mg total) by mouth every 4 (four) hours as needed (for pain scale < 4). (Patient not taking: Reported on 01/09/2018)  . amoxicillin-clavulanate (AUGMENTIN) 875-125 MG tablet Take 1 tablet by mouth 2 (two) times daily.  . budesonide-formoterol (SYMBICORT) 160-4.5 MCG/ACT inhaler Inhale 2 puffs into the lungs 2 (two) times daily.  . hydrocortisone cream 1 % Apply 1  application topically 2 (two) times daily as needed for itching. otc medication for itching  . hydrOXYzine (VISTARIL) 25 MG capsule Take 25 mg by mouth 3 (three) times daily as needed for itching.   Marland Kitchen ibuprofen (ADVIL,MOTRIN) 600 MG tablet Take 1 tablet (600 mg total) by mouth every 6 (six) hours. (Patient not taking: Reported on 01/09/2018)  . nystatin (MYCOSTATIN) 100000 UNIT/ML suspension Take 5 mLs (500,000 Units total) by mouth 4 (four) times daily.  . predniSONE (DELTASONE) 10 MG tablet 4 tabs for 2 days, then 3 tabs for 2 days, 2 tabs for 2 days, then 1 tab for 2 days, then stop  . Prenatal Vit-Fe Fumarate-FA (PRENATAL COMPLETE) 14-0.4 MG TABS Take 1 tablet by mouth daily. (Patient not taking: Reported on 01/09/2018)   No facility-administered encounter medications on file as of 01/09/2018.      Review of Systems  Review of Systems  Constitutional: Positive for fatigue and fever (For the first 2 weeks of illness afebrile for the last 2 weeks). Negative for chills and unexpected weight change.  HENT: Positive for congestion and postnasal drip. Negative for ear pain, sinus pressure and sinus pain.   Respiratory: Positive for cough and shortness of breath. Negative for chest tightness and wheezing.   Cardiovascular: Negative for chest pain and palpitations.  Gastrointestinal: Negative for blood in stool, diarrhea, nausea and vomiting.  Genitourinary: Negative for dysuria, frequency and urgency.  Musculoskeletal: Negative for arthralgias.  Skin: Negative for color change.  Allergic/Immunologic: Positive for environmental allergies (Patient believes that dust is a trigger). Negative for food allergies.  Neurological: Negative for dizziness, light-headedness and headaches.  Psychiatric/Behavioral: Negative for dysphoric mood. The patient is nervous/anxious.   All other systems reviewed and are negative.    Physical Exam  BP 108/60 (BP Location: Left Arm, Cuff Size: Normal)   Pulse 99    Temp 98.5 F (36.9 C) (Oral)   Ht 5' 7.25" (1.708 m)   Wt 145 lb 9.6 oz (66 kg)   SpO2 98%   BMI 22.63 kg/m   Wt Readings from Last 5 Encounters:  01/09/18 145 lb 9.6 oz (66 kg)  06/19/17 155 lb 6.4 oz (70.5 kg)  01/30/17 150 lb (68 kg)  01/19/17 140 lb (63.5 kg)  11/04/16 144 lb 11.2 oz (65.6 kg)   Patient is afebrile today  Physical Exam  Constitutional: She is oriented to person, place, and time and well-developed, well-nourished, and in no distress. Vital signs are normal. No distress.  HENT:  Head: Normocephalic and atraumatic.  Right Ear: Hearing, external ear and ear canal normal.  Left Ear: Hearing, external ear and ear canal normal.  Nose: Mucosal edema and rhinorrhea present. Right sinus exhibits no maxillary sinus tenderness and no frontal sinus tenderness. Left sinus exhibits no maxillary sinus tenderness and no frontal sinus  tenderness.  Mouth/Throat: Uvula is midline and oropharynx is clear and moist. No oropharyngeal exudate.  + TMs with effusion without infection bilaterally  Eyes: Pupils are equal, round, and reactive to light.  Neck: Normal range of motion. Neck supple. No JVD present.  Cardiovascular: Normal rate, regular rhythm and normal heart sounds.  Pulmonary/Chest: Effort normal. No accessory muscle usage. No respiratory distress. She has no decreased breath sounds. She has wheezes (Slight expiratory wheeze). She has rhonchi (Throughout all lobes).  Musculoskeletal: Normal range of motion.        General: No edema.  Lymphadenopathy:    She has no cervical adenopathy.  Neurological: She is alert and oriented to person, place, and time. Gait normal.  Skin: Skin is warm and dry. She is not diaphoretic. No erythema.  Psychiatric: Memory, affect and judgment normal. Her mood appears anxious.  Nursing note and vitals reviewed.     Lab Results:  CBC    Component Value Date/Time   WBC 8.3 10/03/2016 0800   RBC 4.10 10/03/2016 0800   HGB 12.3  10/03/2016 0800   HGB 11.8 07/15/2016 0848   HCT 34.6 (L) 10/03/2016 0800   HCT 36.0 07/15/2016 0848   PLT 194 10/03/2016 0800   PLT 226 07/15/2016 0848   MCV 84.4 10/03/2016 0800   MCV 90 07/15/2016 0848   MCH 30.0 10/03/2016 0800   MCHC 35.5 10/03/2016 0800   RDW 13.8 10/03/2016 0800   RDW 13.6 07/15/2016 0848   LYMPHSABS 1.6 04/22/2016 1111   MONOABS 0.7 04/22/2016 1111   EOSABS 0.2 04/22/2016 1111   BASOSABS 0.0 04/22/2016 1111    BMET    Component Value Date/Time   NA 136 04/22/2016 1111   K 3.4 (L) 04/22/2016 1111   CL 103 04/22/2016 1111   CO2 23 04/22/2016 1111   GLUCOSE 135 (H) 04/22/2016 1111   BUN 6 04/22/2016 1111   CREATININE 0.65 04/22/2016 1111   CREATININE 0.74 02/26/2016 1433   CALCIUM 9.4 04/22/2016 1111   GFRNONAA >60 04/22/2016 1111   GFRAA >60 04/22/2016 1111    BNP No results found for: BNP  ProBNP No results found for: PROBNP    Assessment & Plan:   32 year old female patient completing acute visit with our office today.  Patient with acute bronchitis will start on antibiotics and short course of prednisone.  We will also get chest x-ray today to rule out pneumonia.  Patient to continue on Symbicort 160 although chart review reveals the patient was previously on Symbicort 80.  Could further evaluate ICS use at further appointments to see if 160 is truly needed at this time.  Patient needs to reestablish with primary care for follow-up visit.  Patient can follow-up closely with our office in the next 2 to 4 weeks to ensure symptoms are resolving as we had not seen her since September 2018 prior to this office visit today.  Bronchitis with asthma, acute Chest xray   Augmentin >>> Take 1 875-125 mg tablet every 12 hours for the next 7 days >>> Take with food  Prednisone 10mg  tablet  >>>4 tabs for 2 days, then 3 tabs for 2 days, 2 tabs for 2 days, then 1 tab for 2 days, then stop >>>take with food  >>>take in the morning   Continue  Symbicort 160 >>> 2 puffs in the morning right when you wake up, rinse out your mouth after use, 12 hours later 2 puffs, rinse after use >>> Take this daily, no matter  what >>> This is not a rescue inhaler   Follow up in 2-4 weeks with Dr. Sherene Sires to ensure symptoms are resolving   Rhinitis, nonallergic, chronic  Continue zyrtec daily Start flonase 1 spray each nostril  Start nasal saline rinses daily   Follow up in 2-4 weeks with Dr. Sherene Sires to ensure symptoms are resolving   Thrush, oral Rinse mouth after Symbicort use Nystatin rinse for oral thrush       Coral Ceo, NP 01/09/2018   This appointment was 32 minutes along with over 50% of the time in direct face-to-face patient care, assessment, plan of care, and follow-up.

## 2018-01-09 NOTE — Patient Instructions (Addendum)
Chest xray   Augmentin >>> Take 1 875-125 mg tablet every 12 hours for the next 7 days >>> Take with food  Prednisone 10mg  tablet  >>>4 tabs for 2 days, then 3 tabs for 2 days, 2 tabs for 2 days, then 1 tab for 2 days, then stop >>>take with food  >>>take in the morning   Continue Symbicort 160 >>> 2 puffs in the morning right when you wake up, rinse out your mouth after use, 12 hours later 2 puffs, rinse after use >>> Take this daily, no matter what >>> This is not a rescue inhaler   Continue zyrtec daily Start flonase 1 spray each nostril  Start nasal saline rinses daily   Nystatin rinse for oral thrush   Can use ibuprophen 600mg  every 6 hours as needed for fever or aches for the next 7 days  >>>take 3 200mg  tablets to make 600mg    Follow up with PCP for scheduled follow up visit   Follow up in 2-4 weeks with Dr. Sherene SiresWert to ensure symptoms are resolving    It is flu season:   >>>Remember to be washing your hands regularly, using hand sanitizer, be careful to use around herself with has contact with people who are sick will increase her chances of getting sick yourself. >>> Best ways to protect herself from the flu: Receive the yearly flu vaccine, practice good hand hygiene washing with soap and also using hand sanitizer when available, eat a nutritious meals, get adequate rest, hydrate appropriately   Please contact the office if your symptoms worsen or you have concerns that you are not improving.   Thank you for choosing [facility] for your healthcare, and for allowing us to partner with you on your healthcare journey. I am thankful to be able to provide care to you today.   Elisha HeadlandBrian Mack FNP-C

## 2018-01-12 ENCOUNTER — Telehealth: Payer: Self-pay | Admitting: Pulmonary Disease

## 2018-01-12 NOTE — Telephone Encounter (Signed)
Call made to patient, made aware of CXR results per NP, BPM. Voiced understanding. Nothing further is needed at this time.

## 2018-01-12 NOTE — Progress Notes (Signed)
Your chest x-ray results of come back.  Showing no acute changes.  No plan of care changes at this time.  Keep follow-up appointment.    Follow-up with our office if symptoms worsen or you do not feel like you are improving under her current regimen.  It was a pleasure taking care of you,  Danyell Shader, FNP 

## 2018-01-29 ENCOUNTER — Ambulatory Visit (INDEPENDENT_AMBULATORY_CARE_PROVIDER_SITE_OTHER): Payer: Medicaid Other | Admitting: Internal Medicine

## 2018-01-29 ENCOUNTER — Encounter: Payer: Self-pay | Admitting: Internal Medicine

## 2018-01-29 DIAGNOSIS — J45909 Unspecified asthma, uncomplicated: Secondary | ICD-10-CM

## 2018-01-29 DIAGNOSIS — J45991 Cough variant asthma: Secondary | ICD-10-CM | POA: Diagnosis not present

## 2018-01-29 DIAGNOSIS — O99519 Diseases of the respiratory system complicating pregnancy, unspecified trimester: Secondary | ICD-10-CM | POA: Diagnosis not present

## 2018-01-29 MED ORDER — BUDESONIDE-FORMOTEROL FUMARATE 160-4.5 MCG/ACT IN AERO: 2.0000 | INHALATION_SPRAY | Freq: Two times a day (BID) | RESPIRATORY_TRACT | 0 refills | Status: DC

## 2018-01-29 MED ORDER — ALBUTEROL SULFATE (2.5 MG/3ML) 0.083% IN NEBU: 2.5000 mg | INHALATION_SOLUTION | RESPIRATORY_TRACT | Status: DC | PRN

## 2018-01-29 MED ORDER — PANTOPRAZOLE SODIUM 40 MG PO TBEC: 40.0000 mg | DELAYED_RELEASE_TABLET | Freq: Every day | ORAL | 2 refills | Status: DC

## 2018-01-29 NOTE — Assessment & Plan Note (Signed)
Quit smoking 02/2016 08/22/2016   try symbicort 160 2bid as breaking rule of 2's on qvar 80 2bid > improved 09/24/2016 so rec continue symb 160 2bid thru pregancy and f/u in 3 m for ? Stepdown then  - 01/29/2018  After extensive coaching inhaler device,  effectiveness =    90% from a baseline of 25%   DDX of  difficult airways management almost all start with A and  include Adherence, Ace Inhibitors, Acid Reflux, Active Sinus Disease, Alpha 1 Antitripsin deficiency, Anxiety masquerading as Airways dz,  ABPA,  Allergy(esp in young), Aspiration (esp in elderly), Adverse effects of meds,  Active smoking or vaping, A bunch of PE's (a small clot burden can't cause this syndrome unless there is already severe underlying pulm or vascular dz with poor reserve) plus two Bs  = Bronchiectasis and Beta blocker use..and one C= CHF   Adherence is always the initial "prime suspect" and is a multilayered concern that requires a "trust but verify" approach in every patient - starting with knowing how to use medications, especially inhalers, correctly, keeping up with refills and understanding the fundamental difference between maintenance and prns vs those medications only taken for a very short course and then stopped and not refilled.  - see hfa teaching  - advised:  If your breathing worsens or you need to use your rescue inhaler more than twice weekly or wake up more than twice a month with any respiratory symptoms or require more than two rescue inhalers per year, we need to see you right away because this means we're not controlling the underlying problem (inflammation) adequately.Rescue inhalers (albuterol) do not control inflammation and overuse can lead to unnecessary and costly consequences.  They can make you feel better temporarily but eventually they will quit working effectively much as sleep aids lead to more insomnia if used regularly >> advised that even mild asthma can lead to death when not treated  appropriately - return with all meds in hand using a trust but verify approach to confirm accurate Medication  Reconciliation The principal here is that until we are certain that the  patients are doing what we've asked, it makes no sense to ask them to do more  ? Acid (or non-acid) GERD > always difficult to exclude as up to 75% of pts in some series report no assoc GI/ Heartburn symptoms> rec max (24h)  acid suppression and diet restrictions/ reviewed and instructions given in writing.   ? Active smoking > denies, reinforced    ? Allergy > high dose ics for now   ? Allergy testing/ singulair trial next steps if not well controlled on verified rx  ? Anxiety /depression > usually at the bottom of this list of usual suspects but should be much higher on this pt's based on H and P and  may interfere with adherence and also interpretation of response or lack thereof to symptom management which can be quite subjective.    >>> for now resume symb 160 2bid/ gerd rx and regroup in 2 weeks with spirometry on return     I had an extended discussion with the patient reviewing all relevant studies completed to date and  lasting 25 minutes of a 40  minute visit to re-establish with me  re  severe non-specific but potentially very serious refractory respiratory symptoms of uncertain and potentially multiple  etiologies.  See device teaching which extended face to face time for this visit    Each maintenance medication  was reviewed in detail including most importantly the difference between maintenance and prns and under what circumstances the prns are to be triggered using an action plan format that is not reflected in the computer generated alphabetically organized AVS.    Please see AVS for specific instructions unique to this office visit that I personally wrote and verbalized to the the pt in detail and then reviewed with pt  by my nurse highlighting any changes in therapy/plan of care  recommended at  today's visit.

## 2018-01-29 NOTE — Patient Instructions (Addendum)
Plan A = Automatic = Symbicort 160 Take 2 puffs first thing in am and then another 2 puffs about 12 hours later.   Work on inhaler technique:  relax and gently blow all the way out then take a nice smooth deep breath back in, triggering the inhaler at same time you start breathing in.  Hold for up to 5 seconds if you can. Blow out thru nose. Rinse and gargle with water when done   Plan B = Backup Only use your albuterol (Proair)  inhaler as a rescue medication to be used if you can't catch your breath by resting or doing a relaxed purse lip breathing pattern.  - The less you use it, the better it will work when you need it. - Ok to use the inhaler up to 2 puffs  every 4 hours if you must but call for appointment if use goes up over your usual need - Don't leave home without it !!  (think of it like the spare tire for your car)   Plan C = Crisis - only use your albuterol nebulizer if you first try Plan B and it fails to help > ok to use the nebulizer up to every 4 hours but if start needing it regularly call for immediate appointment   Pantoprazole (protonix) 40 mg   Take  30-60 min before first meal of the day and  Zantac 150 x 2 after supper  until return to office - this is the best way to tell whether stomach acid is contributing to your problem.     Please schedule a follow up office visit in 2 weeks, sooner if needed  with all medications /inhalers/ solutions in hand so we can verify exactly what you are taking. This includes all medications from all doctors and over the counters - spirometry and feno on return

## 2018-01-29 NOTE — Progress Notes (Signed)
Subjective:     Patient ID: Selena Haynes, female   DOB: 15-May-1985,     MRN: [id number]     Brief patient profile:  32 yobf quit  Smoking 02/2016  With onset of asthma early 2000's while [place] with variable severity and on and off advair then moved to [place] around summer of 2016 worse and admitted to [facility]  And on d/c ? ? Meds short term then moved to [place] on qvar 80 2 bid plus freq saba up to 4-5 per week while pregnant  so referred to pulmonary clinic 08/22/2016 by Dr   Ronnie Doss     History of Present Illness  08/22/2016 1st [facility] office visit/ Selena Haynes  @ [redacted] weeks gestation  Chief Complaint  Patient presents with  . Pulmonary Consult    Self referral. Pt states dxed with Asthma at age 5. She c/o increased SOB off and on for the past few months. She has also noticed some wheezing. She is coughing some, but not producing anyu sputum. She is using proair 4 x per wk on average.   variable doe but never at rest unless during coughing fit but not producing much mucus, overall much worse since IUP Less gerd than prev IUP/ only mild nasal congestion/ watery rhinitis Has neb saba but hasn't used in months  rec Stop Qvar (brown inhaler) Plan A = Automatic =  Symbicort 160 Take 2 puffs first thing in am and then another 2 puffs about 12 hours later.  Work on inhaler technique:  Plan B = Backup Only use your albuterol as a rescue medication  Plan C = Crisis - only use your albuterol nebulizer if you first try Plan B and it fails to help > ok to use the nebulizer up to every 4 hours but if start needing it regularly call for immediate appointment   09/24/2016  f/u ov/Selena Haynes re:  Asthma dtc during pregnancy  Chief Complaint  Patient presents with  . Follow-up    Breathing has improved some. She is using proair now once per wk on average.   much better breathing and less need for saba  but coughs if slides down under 45 degrees but no need for rescue saba hs and still lots of noct nasal  congestion Just  A few times needed saba hfa/ no neb  Overt hb despite ppi and h2hs but also chewing lots of mint gum  rec No change medications GERD diet  Breathe right strips and nasal saline  Please schedule a follow up visit in 3 months but call sooner if needed> did not return post partum as rec but did fine with delivery and post partum period        01/29/2018  f/u ov/Selena Haynes re: dtc asthma/ no longer nursing  Chief Complaint  Patient presents with  . Follow-up    Breathing is some better but not back to baseline. She states her energy level has been better. She does not know how often she is using her rescue inhaler or her symbicort inhaler. She does not recognize the difference between these inhalers.   prior to the flu was doing fine but still using inhalers a couple times a week with several bad asthma attacks req neb on job then flu around tgiving 2019 and downhill since  Dyspnea:  MMRC1 = can walk nl pace, flat grade, can't hurry or go uphills or steps s sob   Cough: congested rattling day > noct  Sleeping: better now on  2 pillows though hears herself wheezing at night SABA use: not clear how often but hasn't used neb since 01/27/18 x one  02: none   No obvious day to day or daytime variability or assoc excess/ purulent sputum or mucus plugs or hemoptysis or cp or chest tightness, subjective wheeze or overt sinus or hb symptoms.   Sleeping now  without nocturnal   exacerbation  of respiratory  c/o's or need for noct saba. Also denies any obvious fluctuation of symptoms with weather or environmental changes or other aggravating or alleviating factors except as outlined above   No unusual exposure hx or h/o childhood pna/ asthma or knowledge of premature birth.  Current Allergies, Complete Past Medical History, Past Surgical History, Family History, and Social History were reviewed in [employer] record.  ROS  The following are not active complaints unless  bolded Hoarseness, sore throat, dysphagia, dental problems, itching, sneezing,  nasal congestion or discharge of excess mucus or purulent secretions, ear ache,   fever, chills, sweats, unintended wt loss or wt gain, classically pleuritic or exertional cp,  orthopnea pnd or arm/hand swelling  or leg swelling, presyncope, palpitations, abdominal pain, anorexia, nausea, vomiting, diarrhea  or change in bowel habits or change in bladder habits, change in stools or change in urine, dysuria, hematuria,  rash, arthralgias, visual complaints, headache, numbness, weakness or ataxia or problems with walking or coordination,  change in mood = anxious/ lots of fm issues  or  memory.        Current Meds  Medication Sig  . acetaminophen (TYLENOL) 325 MG tablet Take 2 tablets (650 mg total) by mouth every 4 (four) hours as needed (for pain scale < 4).  . albuterol (PROAIR HFA) 108 (90 Base) MCG/ACT inhaler INHALE 2 PUFFS INTO THE LUNGS EVERY 6 (SIX) HOURS AS NEEDED FOR WHEEZING OR SHORTNESS OF BREATH.  . budesonide-formoterol (SYMBICORT) 160-4.5 MCG/ACT inhaler Inhale 2 puffs into the lungs 2 (two) times daily.  . cetirizine (ZYRTEC) 10 MG tablet Take 10 mg by mouth daily as needed for allergies.   . hydrocortisone cream 1 % Apply 1 application topically 2 (two) times daily as needed for itching. otc medication for itching  . hydrOXYzine (VISTARIL) 25 MG capsule Take 25 mg by mouth 3 (three) times daily as needed for itching.   Marland Kitchen ibuprofen (ADVIL,MOTRIN) 600 MG tablet Take 1 tablet (600 mg total) by mouth every 6 (six) hours.  Marland Kitchen nystatin (MYCOSTATIN) 100000 UNIT/ML suspension Take 5 mLs (500,000 Units total) by mouth 4 (four) times daily.  . Prenatal Vit-Fe Fumarate-FA (PRENATAL COMPLETE) 14-0.4 MG TABS Take 1 tablet by mouth daily.  . ranitidine (ZANTAC) 150 MG tablet Take 1 tablet (150 mg total) by mouth 2 (two) times daily as needed for heartburn.           Objective:   Physical Exam  amb bf nad     01/29/2018          147   09/24/2016         172   08/22/16 165 lb (74.8 kg)  08/19/16 164 lb 14.4 oz (74.8 kg)  08/14/16 163 lb (73.9 kg)    Vital signs reviewed - Note on arrival 02 sats  96% on RA    HEENT: nl dentition, turbinates bilaterally, and oropharynx. Nl external ear canals without cough reflex   NECK :  without JVD/Nodes/TM/ nl carotid upstrokes bilaterally   LUNGS: no acc muscle use,  Nl contour chest  With mostly upper airway "wheezing"   CV:  RRR  no s3 or murmur or increase in P2, and no edema   ABD:  soft and nontender with nl inspiratory excursion in the supine position. No bruits or organomegaly appreciated, bowel sounds nl  MS:  Nl gait/ ext warm without deformities, calf tenderness, cyanosis or clubbing No obvious joint restrictions   SKIN: warm and dry without lesions    NEURO:  alert, approp, nl sensorium with  no motor or cerebellar deficits apparent.        I personally reviewed images and agree with radiology impression as follows:  CXR:   01/09/18  No edema or consolidation.       Assessment:

## 2018-01-29 NOTE — Assessment & Plan Note (Signed)
Quit smoking 02/2016 08/22/2016   try symbicort 160 2bid as breaking rule of 2's on qvar 80 2bid > improved 09/24/2016 so rec continue symb 160 2bid thru pregancy and f/u in 3 m for ? Stepdown then  - 01/29/2018  After extensive coaching inhaler device,  effectiveness =    90% from a baseline of 25%   DDX of  difficult airways management almost all start with A and  include Adherence, Ace Inhibitors, Acid Reflux, Active Sinus Disease, Alpha 1 Antitripsin deficiency, Anxiety masquerading as Airways dz,  ABPA,  Allergy(esp in young), Aspiration (esp in elderly), Adverse effects of meds,  Active smoking or vaping, A bunch of PE's (a small clot burden can't cause this syndrome unless there is already severe underlying pulm or vascular dz with poor reserve) plus two Bs  = Bronchiectasis and Beta blocker use..and one C= CHF   Adherence is always the initial "prime suspect" and is a multilayered concern that requires a "trust but verify" approach in every patient - starting with knowing how to use medications, especially inhalers, correctly, keeping up with refills and understanding the fundamental difference between maintenance and prns vs those medications only taken for a very short course and then stopped and not refilled.  - see hfa teaching  - return with all meds in hand using a trust but verify approach to confirm accurate Medication  Reconciliation The principal here is that until we are certain that the  patients are doing what we've asked, it makes no sense to ask them to do more.

## 2018-02-12 ENCOUNTER — Ambulatory Visit: Payer: Self-pay | Admitting: Internal Medicine

## 2018-02-18 ENCOUNTER — Ambulatory Visit: Payer: Self-pay | Admitting: Internal Medicine
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
## 2019-02-04 ENCOUNTER — Ambulatory Visit: Payer: Medicaid Other | Admitting: Obstetrics and Gynecology

## 2019-02-04 ENCOUNTER — Other Ambulatory Visit (HOSPITAL_COMMUNITY)
Admission: RE | Admit: 2019-02-04 | Discharge: 2019-02-04 | Disposition: A | Discharge status: Home / Self Care | Payer: Medicaid Other | Source: Ambulatory Visit | Attending: Obstetrics and Gynecology | Admitting: Obstetrics and Gynecology

## 2019-02-04 ENCOUNTER — Other Ambulatory Visit: Payer: Self-pay

## 2019-02-04 ENCOUNTER — Encounter: Payer: Self-pay | Admitting: Obstetrics and Gynecology

## 2019-02-04 VITALS — BP 113/80 | HR 77 | Ht 66.0 in | Wt 142.0 lb

## 2019-02-04 DIAGNOSIS — Z30013 Encounter for initial prescription of injectable contraceptive: Secondary | ICD-10-CM

## 2019-02-04 DIAGNOSIS — Z01419 Encounter for gynecological examination (general) (routine) without abnormal findings: Secondary | ICD-10-CM | POA: Diagnosis present

## 2019-02-04 DIAGNOSIS — N898 Other specified noninflammatory disorders of vagina: Secondary | ICD-10-CM | POA: Insufficient documentation

## 2019-02-04 DIAGNOSIS — Z0000 Encounter for general adult medical examination without abnormal findings: Secondary | ICD-10-CM | POA: Diagnosis not present

## 2019-02-04 DIAGNOSIS — Z3202 Encounter for pregnancy test, result negative: Secondary | ICD-10-CM | POA: Diagnosis not present

## 2019-02-04 LAB — POCT PREGNANCY, URINE: Preg Test, Ur: NEGATIVE

## 2019-02-04 MED ORDER — MEDROXYPROGESTERONE ACETATE 150 MG/ML IM SUSP
150.0000 mg | Freq: Once | INTRAMUSCULAR | Status: AC
  Administered 2019-02-04: 15:00:00 150 mg via INTRAMUSCULAR

## 2019-02-04 NOTE — Addendum Note (Signed)
Addended by: Kathee Delton on: 02/04/2019 02:37 PM   Modules accepted: Orders

## 2019-02-04 NOTE — Progress Notes (Signed)
   WELL-WOMAN PHYSICAL & PAP Patient name: Selena Haynes MRN [id number]  Date of birth: 07-27-1985 Chief Complaint:   Gynecologic Exam and vaginal irritation  History of Present Illness:   Cova Knieriem is a 34 y.o. [phone] African American female being seen today for a routine well-woman exam.  Current complaints: "I think I have BV."  PCP: Augustine Radar, MD      does desire labs Patient's last menstrual period was 01/29/2019 (exact date). The current method of family planning is Depo-Provera injections.  Last pap "2.5 years ago". Results were: normal Last mammogram: n/a. Results were: n/a. Family h/o breast cancer: No Last colonoscopy: n/a . Results were: n/a. Family h/o colorectal cancer: No Review of Systems:   Pertinent items are noted in HPI Denies any headaches, blurred vision, fatigue, shortness of breath, chest pain, abdominal pain, abnormal vaginal discharge/itching/odor/irritation, problems with periods, bowel movements, urination, or intercourse unless otherwise stated above. Pertinent History Reviewed:  Reviewed past medical,surgical, social and family history.  Reviewed problem list, medications and allergies. Physical Assessment:   Vitals:   02/04/19 1320  BP: 113/80  Pulse: 77  Weight: 142 lb (64.4 kg)  Height: 5\' 6"  (1.676 m)  Body mass index is 22.92 kg/m.        Physical Examination:   General appearance - well appearing, and in no distress  Mental status - alert, oriented to person, place, and time  Psych:  She has a normal mood and affect  Skin - warm and dry, normal color, no suspicious lesions noted  Chest - effort normal, all lung fields clear to auscultation bilaterally  Heart - normal rate and regular rhythm  Neck:  midline trachea, no thyromegaly or nodules  Breasts - breasts appear normal, no suspicious masses, no skin or nipple changes or  axillary nodes  Abdomen - soft, nontender, nondistended, no masses or organomegaly  Pelvic - VULVA:  normal appearing vulva with no masses, tenderness or lesions  VAGINA: normal appearing vagina with normal color and discharge, no lesions  CERVIX: normal appearing cervix without discharge or lesions, no CMT  Thin prep pap is done with HR HPV cotesting  UTERUS: uterus is felt to be normal size, shape, consistency and nontender   ADNEXA: No adnexal masses or tenderness noted.  Rectal - deferred Extremities:  No swelling or varicosities noted  Results for orders placed or performed in visit on 02/04/19 (from the past 24 hour(s))  Pregnancy, urine POC   Collection Time: 02/04/19  1:40 PM  Result Value Ref Range   Preg Test, Ur NEGATIVE NEGATIVE    Assessment & Plan:  1) Well-Woman Exam with Pap  2) Encounter for annual routine gynecological examination  - Cytology - PAP( [place])  3) Vaginal irritation  - Cervicovaginal ancillary only( [place]) - Will treat according to results   4) Encounter for initial prescription of injectable contraceptive - Depo Provera injection given IM by RN  Labs/procedures today: pap & STI screening  Orders Placed This Encounter  Procedures  . Pregnancy, urine POC    Meds: Depo injection  Follow-up: Return in about 1 year (around 02/04/2020) for Annual Exam.  02/06/2020 MSN, CNM 02/04/2019 1:50 PM

## 2019-02-04 NOTE — Patient Instructions (Signed)
Contraceptive Injection A contraceptive injection is a shot that prevents pregnancy. It is also called the birth control shot. The shot contains the hormone progestin, which prevents pregnancy by:  Stopping the ovaries from releasing eggs.  Thickening cervical mucus to prevent sperm from entering the cervix.  Thinning the lining of the uterus to prevent a fertilized egg from attaching to the uterus. Contraceptive injections are given under the skin (subcutaneous) or into a muscle (intramuscular). For these shots to work, you must get one of them every 3 months (12 weeks) from a health care provider. Tell a health care provider about:  Any allergies you have.  All medicines you are taking, including vitamins, herbs, eye drops, creams, and over-the-counter medicines.  Any blood disorders you have.  Any medical conditions you have.  Whether you are pregnant or may be pregnant. What are the risks? Generally, this is a safe procedure. However, problems may occur, including:  Mood changes or depression.  Loss of bone density (osteoporosis) after long-term use.  Blood clots.  Higher risk of an egg being fertilized outside your uterus (ectopic pregnancy).This is rare. What happens before the procedure?  Your health care provider may do a routine physical exam.  You may have a test to make sure you are not pregnant. What happens during the procedure?  The area where the shot will be given will be cleaned and sanitized with alcohol.  A needle will be inserted into a muscle in your upper arm or buttock, or into the skin of your thigh or abdomen. The needle will be attached to a syringe with the medicine inside of it.  The medicine will be pushed through the syringe and injected into your body.  A small bandage (dressing) may be placed over the injection site. What can I expect after the procedure?  After the procedure, it is common to have: ? Soreness around the injection site for  a couple of days. ? Irregular menstrual bleeding. ? Weight gain. ? Breast tenderness. ? Headaches. ? Discomfort in your abdomen.  Ask your health care provider whether you need to use an added method of birth control (backup contraception), such as a condom, sponge, or spermicide. ? If the first shot is given 1-7 days after the start of your last period, you will not need backup contraception. ? If the first shot is given at any other time during your menstrual cycle, you should avoid having sex or you will need backup contraception for 7 days after you receive the shot. Follow these instructions at home: General instructions   Take over-the-counter and prescription medicines only as told by your health care provider.  Do not massage the injection site.  Track your menstrual periods so you will know if they become irregular.  Always use a condom to protect against STIs (sexually transmitted infections).  Make sure you schedule an appointment in time for your next shot, and mark it on your calendar. For the birth control to prevent pregnancy, you must get the injections every 3 months (12 weeks). Lifestyle  Do not use any products that contain nicotine or tobacco, such as cigarettes and e-cigarettes. If you need help quitting, ask your health care provider.  Eat foods that are high in calcium and vitamin D, such as milk, cheese, and salmon. Doing this may help with any loss in bone density that is caused by the contraceptive injection. Ask your health care provider for dietary recommendations. Contact a health care provider if:  You   have nausea or vomiting.  You have abnormal vaginal discharge or bleeding.  You miss a period or you think you might be pregnant.  You experience mood changes or depression.  You feel dizzy or light-headed.  You have leg pain. Get help right away if:  You have chest pain.  You cough up blood.  You have shortness of breath.  You have a  severe headache that does not go away.  You have numbness in any part of your body.  You have slurred speech.  You have vision problems.  You have vaginal bleeding that is abnormally heavy or does not stop.  You have severe pain in your abdomen.  You have depression that does not get better with treatment. If you ever feel like you may hurt yourself or others, or have thoughts about taking your own life, get help right away. You can go to your nearest emergency department or call:  Your local emergency services (911 in the [place]  A suicide crisis helpline, such as the National Suicide Prevention Lifeline at 1-800-273-8255. This is open 24 hours a day. Summary  A contraceptive injection is a shot that prevents pregnancy. It is also called the birth control shot.  The shot is given under the skin (subcutaneous) or into a muscle (intramuscular).  After this procedure, it is common to have soreness around the injection site for a couple of days.  To prevent pregnancy, the shot must be given by a health care provider every 3 months (12 weeks).  After you have the shot, ask your health care provider whether you need to use an added method of birth control (backup contraception), such as a condom, sponge, or spermicide. This information is not intended to replace advice given to you by your health care provider. Make sure you discuss any questions you have with your health care provider. Document Revised: 12/20/2016 Document Reviewed: 09/02/2016 Elsevier Patient Education  2020 [employer]vier Inc.  

## 2019-02-05 ENCOUNTER — Other Ambulatory Visit: Payer: Self-pay | Admitting: Obstetrics and Gynecology

## 2019-02-05 DIAGNOSIS — B9689 Other specified bacterial agents as the cause of diseases classified elsewhere: Secondary | ICD-10-CM

## 2019-02-05 LAB — CERVICOVAGINAL ANCILLARY ONLY
Bacterial Vaginitis (gardnerella): POSITIVE — AB
Candida Glabrata: NEGATIVE
Candida Vaginitis: POSITIVE — AB
Comment: NEGATIVE
Comment: NEGATIVE
Comment: NEGATIVE

## 2019-02-05 MED ORDER — METRONIDAZOLE 500 MG PO TABS: 500.0000 mg | ORAL_TABLET | Freq: Two times a day (BID) | ORAL | 0 refills | Status: AC

## 2019-02-05 NOTE — Progress Notes (Signed)
Rx sent for BV to pharmacy on file. Unable to notify via My Chart d/t account not activated.  Raelyn Mora, CNM

## 2019-02-08 ENCOUNTER — Telehealth: Payer: Self-pay

## 2019-02-08 DIAGNOSIS — B3731 Acute candidiasis of vulva and vagina: Secondary | ICD-10-CM

## 2019-02-08 DIAGNOSIS — B373 Candidiasis of vulva and vagina: Secondary | ICD-10-CM

## 2019-02-08 LAB — CYTOLOGY - PAP
Chlamydia: NEGATIVE
Comment: NEGATIVE
Comment: NEGATIVE
Comment: NEGATIVE
Comment: NORMAL
Diagnosis: NEGATIVE
High risk HPV: NEGATIVE
Neisseria Gonorrhea: NEGATIVE
Trichomonas: NEGATIVE

## 2019-02-08 MED ORDER — FLUCONAZOLE 150 MG PO TABS: 150.0000 mg | ORAL_TABLET | Freq: Once | ORAL | 0 refills | Status: AC

## 2019-02-08 NOTE — Telephone Encounter (Addendum)
-----   Message from Raelyn Mora, [place] sent at 02/05/2019  9:45 PM EST ----- Please call this patient to make sure she picks up her Rx for Flagyl from her pharmacy.  LM for pt to please return the call back.

## 2019-02-08 NOTE — Telephone Encounter (Signed)
Selena Haynes left a voice message at 10:43 requesting a call about her test results.  I called Selena Haynes and informed her that Selena Haynes, her provider wanted her to know her wet prep showed BV and yeast and that she has already sent in RX for flagyl for BV. I explained she should not take alcohol while on Flagyl and to take with food to minimize GI distress. I informed her no rx was sent in for yeast ; but asked if she was having symptoms. She does c/o vaginal itching. Diflucan x1 sent in per protocol and I advised Selena Haynes to take the flagyl first, then diflucan and to let us know if her symtoms are not relieved. She voices understanding.  Adaly Puder,RN

## 2019-02-09 ENCOUNTER — Telehealth: Payer: Self-pay | Admitting: *Deleted

## 2019-02-09 NOTE — Telephone Encounter (Addendum)
-----   Message from Raelyn Mora, [place] sent at 02/09/2019 11:29 AM EST ----- Please notify the patient of negative pap & HPV results  1/19  1155  Called pt and informed her of Pap results as stated by Raelyn Mora, CNM. PT was advised she will need yearly visit for routine Gyn care. She voiced understanding of all information and instructions given.

## 2019-02-14 ENCOUNTER — Encounter (HOSPITAL_COMMUNITY): Payer: Self-pay

## 2019-02-14 ENCOUNTER — Ambulatory Visit (HOSPITAL_COMMUNITY)
Admission: EM | Admit: 2019-02-14 | Discharge: 2019-02-14 | Disposition: A | Discharge status: Home / Self Care | Payer: Medicaid Other | Attending: Internal Medicine | Admitting: Internal Medicine

## 2019-02-14 ENCOUNTER — Ambulatory Visit (INDEPENDENT_AMBULATORY_CARE_PROVIDER_SITE_OTHER): Payer: Medicaid Other

## 2019-02-14 ENCOUNTER — Other Ambulatory Visit: Payer: Self-pay

## 2019-02-14 DIAGNOSIS — M25561 Pain in right knee: Secondary | ICD-10-CM

## 2019-02-14 DIAGNOSIS — M545 Low back pain, unspecified: Secondary | ICD-10-CM

## 2019-02-14 MED ORDER — KETOROLAC TROMETHAMINE 30 MG/ML IJ SOLN
INTRAMUSCULAR | Status: AC
  Filled 2019-02-14: qty 1

## 2019-02-14 MED ORDER — CYCLOBENZAPRINE HCL 10 MG PO TABS: 10.0000 mg | ORAL_TABLET | Freq: Two times a day (BID) | ORAL | 0 refills | Status: DC | PRN

## 2019-02-14 MED ORDER — IBUPROFEN 600 MG PO TABS: 600.0000 mg | ORAL_TABLET | Freq: Four times a day (QID) | ORAL | 0 refills | Status: DC | PRN

## 2019-02-14 MED ORDER — KETOROLAC TROMETHAMINE 30 MG/ML IJ SOLN
30.0000 mg | Freq: Once | INTRAMUSCULAR | Status: AC
  Administered 2019-02-14: 30 mg via INTRAMUSCULAR
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
## 2019-03-16 ENCOUNTER — Other Ambulatory Visit: Payer: Self-pay

## 2019-03-16 ENCOUNTER — Encounter (HOSPITAL_COMMUNITY): Payer: Self-pay | Admitting: Emergency Medicine

## 2019-03-16 ENCOUNTER — Ambulatory Visit (HOSPITAL_COMMUNITY)
Admission: EM | Admit: 2019-03-16 | Discharge: 2019-03-16 | Disposition: A | Discharge status: Home / Self Care | Payer: Medicaid Other | Attending: Urgent Care | Admitting: Urgent Care

## 2019-03-16 DIAGNOSIS — M62830 Muscle spasm of back: Secondary | ICD-10-CM | POA: Diagnosis present

## 2019-03-16 DIAGNOSIS — M5432 Sciatica, left side: Secondary | ICD-10-CM | POA: Diagnosis present

## 2019-03-16 DIAGNOSIS — R3915 Urgency of urination: Secondary | ICD-10-CM | POA: Insufficient documentation

## 2019-03-16 DIAGNOSIS — E860 Dehydration: Secondary | ICD-10-CM | POA: Insufficient documentation

## 2019-03-16 DIAGNOSIS — Z3202 Encounter for pregnancy test, result negative: Secondary | ICD-10-CM | POA: Diagnosis not present

## 2019-03-16 DIAGNOSIS — R350 Frequency of micturition: Secondary | ICD-10-CM | POA: Diagnosis present

## 2019-03-16 LAB — POCT URINALYSIS DIP (DEVICE)
Bilirubin Urine: NEGATIVE
Glucose, UA: NEGATIVE mg/dL
Hgb urine dipstick: NEGATIVE
Ketones, ur: NEGATIVE mg/dL
Nitrite: NEGATIVE
Protein, ur: NEGATIVE mg/dL
Specific Gravity, Urine: 1.025 (ref 1.005–1.030)
Urobilinogen, UA: 0.2 mg/dL (ref 0.0–1.0)
pH: 7 (ref 5.0–8.0)

## 2019-03-16 LAB — POCT PREGNANCY, URINE: Preg Test, Ur: NEGATIVE

## 2019-03-16 LAB — POC URINE PREG, ED: Preg Test, Ur: NEGATIVE

## 2019-03-16 MED ORDER — NAPROXEN 500 MG PO TABS: 500.0000 mg | ORAL_TABLET | Freq: Two times a day (BID) | ORAL | 0 refills | Status: DC

## 2019-03-16 MED ORDER — METHYLPREDNISOLONE SODIUM SUCC 125 MG IJ SOLR
INTRAMUSCULAR | Status: AC
  Filled 2019-03-16: qty 2

## 2019-03-16 MED ORDER — METHYLPREDNISOLONE SODIUM SUCC 125 MG IJ SOLR
125.0000 mg | Freq: Once | INTRAMUSCULAR | Status: AC
  Administered 2019-03-16: 09:00:00 125 mg via INTRAMUSCULAR

## 2019-03-16 MED ORDER — TIZANIDINE HCL 4 MG PO TABS: 4.0000 mg | ORAL_TABLET | Freq: Four times a day (QID) | ORAL | 0 refills | Status: DC | PRN
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
## 2019-03-17 LAB — URINE CULTURE

## 2019-04-01 ENCOUNTER — Encounter (HOSPITAL_COMMUNITY): Payer: Self-pay

## 2019-04-01 ENCOUNTER — Other Ambulatory Visit: Payer: Self-pay

## 2019-04-01 ENCOUNTER — Ambulatory Visit (HOSPITAL_COMMUNITY)
Admission: EM | Admit: 2019-04-01 | Discharge: 2019-04-01 | Disposition: A | Discharge status: Home / Self Care | Payer: Medicaid Other | Attending: Family Medicine | Admitting: Family Medicine

## 2019-04-01 DIAGNOSIS — M5442 Lumbago with sciatica, left side: Secondary | ICD-10-CM | POA: Diagnosis not present

## 2019-04-01 DIAGNOSIS — G8929 Other chronic pain: Secondary | ICD-10-CM | POA: Diagnosis not present

## 2019-04-01 HISTORY — DX: Dorsalgia, unspecified: M54.9

## 2019-04-01 MED ORDER — KETOROLAC TROMETHAMINE 30 MG/ML IJ SOLN
INTRAMUSCULAR | Status: AC
  Filled 2019-04-01: qty 1

## 2019-04-01 MED ORDER — PREDNISONE 10 MG (21) PO TBPK: ORAL_TABLET | ORAL | 0 refills | Status: DC

## 2019-04-01 MED ORDER — KETOROLAC TROMETHAMINE 30 MG/ML IJ SOLN
30.0000 mg | Freq: Once | INTRAMUSCULAR | Status: AC
  Administered 2019-04-01: 30 mg via INTRAMUSCULAR
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
## 2019-04-01 NOTE — Discharge Instructions (Signed)
Treating her pain with Toradol injection here in clinic.  Sending prednisone taper to take over the next 6 days with food.  Do not take any other NSAIDs while taking this medication.  You can take additional Tylenol if needed.  You can also take your muscle relaxers as needed. Try doing heat to the area. Putting an referral to physical therapy.  Also putting 2 contacts on your discharge instructions for primary care follow-up

## 2019-04-01 NOTE — ED Triage Notes (Signed)
Pt presents with continued back pain that is unrelieved with prescribed medication, pt states she was unable to follow up with neurology as of yet.

## 2019-04-02 ENCOUNTER — Telehealth: Payer: Self-pay | Admitting: Family Medicine

## 2019-04-02 ENCOUNTER — Encounter: Payer: Self-pay | Admitting: *Deleted

## 2019-04-02 DIAGNOSIS — B9689 Other specified bacterial agents as the cause of diseases classified elsewhere: Secondary | ICD-10-CM

## 2019-04-02 MED ORDER — METRONIDAZOLE 500 MG PO TABS: 500.0000 mg | ORAL_TABLET | Freq: Two times a day (BID) | ORAL | 0 refills | Status: DC

## 2019-04-02 NOTE — Telephone Encounter (Signed)
Called pt to discuss her sx of possible BV. She did not answer. Since pt had +BV on 02/04/19, will send Rx for Metronidazole and notify pt via MyChart message. Pt does not need to keep nurse visit on 3/15 as scheduled unless desired.

## 2019-04-02 NOTE — Telephone Encounter (Signed)
Patient called to say she thinks she has BV. She wanted to get something called in, but an appointment was made just in case she needed one.

## 2019-04-07 ENCOUNTER — Ambulatory Visit: Payer: Medicaid Other

## 2019-06-29 ENCOUNTER — Other Ambulatory Visit: Payer: Self-pay

## 2019-06-29 ENCOUNTER — Ambulatory Visit (INDEPENDENT_AMBULATORY_CARE_PROVIDER_SITE_OTHER): Payer: Medicaid Other | Admitting: *Deleted

## 2019-06-29 VITALS — BP 128/72 | HR 80 | Temp 97.9°F

## 2019-06-29 DIAGNOSIS — Z3202 Encounter for pregnancy test, result negative: Secondary | ICD-10-CM | POA: Diagnosis not present

## 2019-06-29 DIAGNOSIS — Z3042 Encounter for surveillance of injectable contraceptive: Secondary | ICD-10-CM | POA: Diagnosis not present

## 2019-06-29 LAB — POCT PREGNANCY, URINE: Preg Test, Ur: NEGATIVE

## 2019-06-29 MED ORDER — MEDROXYPROGESTERONE ACETATE 150 MG/ML IM SUSP
150.0000 mg | Freq: Once | INTRAMUSCULAR | Status: AC
  Administered 2019-06-29: 150 mg via INTRAMUSCULAR

## 2019-06-29 NOTE — Progress Notes (Signed)
Pt presents for Depo Provera. Last does was 02/04/19. Pt missed dose which was due 4/1-4/15. She reports last sex was 05/20/19 with condom. UPT negative today. Depo Provera 150 mg administered. Pt tolerated well. Next injection due 8/24-9/7.

## 2019-06-29 NOTE — Progress Notes (Signed)
Chart reviewed for nurse visit. Agree with plan of care.   Judeth Horn, NP 06/29/2019 12:56 PM

## 2019-08-28 ENCOUNTER — Other Ambulatory Visit: Payer: Self-pay

## 2019-08-28 ENCOUNTER — Ambulatory Visit (HOSPITAL_COMMUNITY)
Admission: EM | Admit: 2019-08-28 | Discharge: 2019-08-28 | Disposition: A | Discharge status: Home / Self Care | Payer: Medicaid Other | Attending: Emergency Medicine | Admitting: Emergency Medicine

## 2019-08-28 ENCOUNTER — Encounter (HOSPITAL_COMMUNITY): Payer: Self-pay

## 2019-08-28 DIAGNOSIS — Z20822 Contact with and (suspected) exposure to covid-19: Secondary | ICD-10-CM | POA: Insufficient documentation

## 2019-08-28 DIAGNOSIS — Z79899 Other long term (current) drug therapy: Secondary | ICD-10-CM | POA: Insufficient documentation

## 2019-08-28 DIAGNOSIS — R197 Diarrhea, unspecified: Secondary | ICD-10-CM | POA: Diagnosis not present

## 2019-08-28 DIAGNOSIS — I10 Essential (primary) hypertension: Secondary | ICD-10-CM | POA: Diagnosis not present

## 2019-08-28 DIAGNOSIS — F17210 Nicotine dependence, cigarettes, uncomplicated: Secondary | ICD-10-CM | POA: Insufficient documentation

## 2019-08-28 DIAGNOSIS — J4540 Moderate persistent asthma, uncomplicated: Secondary | ICD-10-CM | POA: Insufficient documentation

## 2019-08-28 DIAGNOSIS — R112 Nausea with vomiting, unspecified: Secondary | ICD-10-CM | POA: Insufficient documentation

## 2019-08-28 DIAGNOSIS — R531 Weakness: Secondary | ICD-10-CM | POA: Insufficient documentation

## 2019-08-28 DIAGNOSIS — G5600 Carpal tunnel syndrome, unspecified upper limb: Secondary | ICD-10-CM | POA: Insufficient documentation

## 2019-08-28 DIAGNOSIS — Z791 Long term (current) use of non-steroidal anti-inflammatories (NSAID): Secondary | ICD-10-CM | POA: Diagnosis not present

## 2019-08-28 DIAGNOSIS — K219 Gastro-esophageal reflux disease without esophagitis: Secondary | ICD-10-CM | POA: Insufficient documentation

## 2019-08-28 LAB — SARS CORONAVIRUS 2 (TAT 6-24 HRS): SARS Coronavirus 2: NEGATIVE

## 2019-08-28 MED ORDER — ONDANSETRON 4 MG PO TBDP
4.0000 mg | ORAL_TABLET | Freq: Once | ORAL | Status: AC
  Administered 2019-08-28: 4 mg via ORAL

## 2019-08-28 MED ORDER — PREDNISONE 50 MG PO TABS: 50.0000 mg | ORAL_TABLET | Freq: Every day | ORAL | 0 refills | Status: AC

## 2019-08-28 MED ORDER — ONDANSETRON 4 MG PO TBDP: 4.0000 mg | ORAL_TABLET | Freq: Three times a day (TID) | ORAL | 0 refills | Status: DC | PRN

## 2019-08-28 MED ORDER — DICYCLOMINE HCL 20 MG PO TABS: 20.0000 mg | ORAL_TABLET | Freq: Three times a day (TID) | ORAL | 0 refills | Status: DC

## 2019-08-28 MED ORDER — ONDANSETRON 4 MG PO TBDP
ORAL_TABLET | ORAL | Status: AC
  Filled 2019-08-28: qty 1

## 2019-08-28 MED ORDER — BUDESONIDE-FORMOTEROL FUMARATE 160-4.5 MCG/ACT IN AERO: 2.0000 | INHALATION_SPRAY | Freq: Two times a day (BID) | RESPIRATORY_TRACT | 0 refills | Status: DC

## 2019-08-28 MED ORDER — ALBUTEROL SULFATE HFA 108 (90 BASE) MCG/ACT IN AERS: 1.0000 | INHALATION_SPRAY | Freq: Four times a day (QID) | RESPIRATORY_TRACT | 0 refills | Status: DC | PRN

## 2019-08-28 NOTE — Discharge Instructions (Signed)
Covid test pending, monitor my chart for results Begin prednisone daily for 5 days to help with asthma Albuterol and Symbicort refilled Zofran for nausea May use Bentyl before meals and bedtime to help with cramping Focus on fluids-Pedialyte/Gatorade May use some Imodium or Pepto-Bismol to help with diarrhea Please follow-up if any symptoms not improving or worsening

## 2019-08-28 NOTE — ED Provider Notes (Signed)
[facility]    CSN: [id number] Arrival date & time: 08/28/19  1035      History   Chief Complaint Chief Complaint  Patient presents with   Weakness   Emesis   Diarrhea    HPI Selena Haynes is a 34 y.o. female history of asthma, hypertension, presenting today for evaluation of nausea vomiting diarrhea as well as asthma exacerbation.  Patient reports that over the past 4 days she has had abdominal cramping and sensation of uneasiness in her stomach.  She has had a lot of nausea at times vomiting and persistent diarrhea.  She has had poor oral intake as well as decreased appetite.  She denies significant rhinorrhea sore throat or cough, but has felt short of breath of recently.  Reports that she is out of her inhalers and has had some wheezing.  She denies any fevers.  Denies chest pain or shortness of breath.  HPI  Past Medical History:  Diagnosis Date   Anxiety    Asthma    Back pain    Carpal tunnel syndrome    Hypertension    Post traumatic stress disorder (PTSD)    Postpartum depression     Patient Active Problem List   Diagnosis Date Noted   Cough variant asthma 01/29/2018   Bronchitis with asthma, acute 01/09/2018   Thrush, oral 01/09/2018   Rhinitis, nonallergic, chronic 09/24/2016   GERD without esophagitis 09/24/2016   Carpal tunnel syndrome, right upper limb 05/22/2016   Anxiety disorder affecting pregnancy, antepartum 05/22/2016   Previous cesarean delivery affecting pregnancy, antepartum 02/26/2016    Past Surgical History:  Procedure Laterality Date   CESAREAN SECTION      OB History    Gravida  5   Para  3   Term  3   Preterm  0   AB  2   Living  3     SAB  2   TAB  0   Ectopic  0   Multiple  0   Live Births  3            Home Medications    Prior to Admission medications   Medication Sig Start Date End Date Taking? Authorizing Provider  acetaminophen (TYLENOL) 325 MG tablet Take 2 tablets  (650 mg total) by mouth every 4 (four) hours as needed (for pain scale < 4). 10/05/16   Winfrey, Harlen Labs, MD  albuterol (VENTOLIN HFA) 108 (90 Base) MCG/ACT inhaler Inhale 1-2 puffs into the lungs every 6 (six) hours as needed for wheezing or shortness of breath. 08/28/19   Rehan Holness C, PA-C  budesonide-formoterol (SYMBICORT) 160-4.5 MCG/ACT inhaler Inhale 2 puffs into the lungs 2 (two) times daily. 08/28/19   Elesia Pemberton C, PA-C  cetirizine (ZYRTEC) 10 MG tablet Take 10 mg by mouth daily as needed for allergies.     [provider]  cyclobenzaprine (FLEXERIL) 10 MG tablet Take 1 tablet (10 mg total) by mouth 2 (two) times daily as needed for muscle spasms. 02/14/19   Lamptey, Britta Mccreedy, MD  dicyclomine (BENTYL) 20 MG tablet Take 1 tablet (20 mg total) by mouth 4 (four) times daily -  before meals and at bedtime. 08/28/19   Ramesh Moan C, PA-C  hydrocortisone cream 1 % Apply 1 application topically 2 (two) times daily as needed for itching. otc medication for itching    [provider]  hydrOXYzine (VISTARIL) 25 MG capsule Take 25 mg by mouth 3 (three) times daily  as needed for itching.     [provider]  ibuprofen (ADVIL) 600 MG tablet Take 1 tablet (600 mg total) by mouth every 6 (six) hours as needed. 02/14/19   Lamptey, Britta Mccreedy, MD  metroNIDAZOLE (FLAGYL) 500 MG tablet Take 1 tablet (500 mg total) by mouth 2 (two) times daily. 04/02/19   Burleson, Brand Males, NP  naproxen (NAPROSYN) 500 MG tablet Take 1 tablet (500 mg total) by mouth 2 (two) times daily. 03/16/19   Wallis Bamberg, PA-C  ondansetron (ZOFRAN ODT) 4 MG disintegrating tablet Take 1-2 tablets (4-8 mg total) by mouth every 8 (eight) hours as needed for nausea or vomiting. 08/28/19   Nikelle Malatesta C, PA-C  pantoprazole (PROTONIX) 40 MG tablet Take 1 tablet (40 mg total) by mouth daily. Take 30-60 min before first meal of the day 01/29/18   Nyoka Cowden, MD  predniSONE (DELTASONE) 50 MG tablet Take 1 tablet (50  mg total) by mouth daily for 5 days. 08/28/19 09/02/19  Eldene Plocher C, PA-C  tiZANidine (ZANAFLEX) 4 MG tablet Take 1 tablet (4 mg total) by mouth every 6 (six) hours as needed for muscle spasms. 03/16/19   Wallis Bamberg, PA-C    Family History Family History  Problem Relation Age of Onset   Asthma Sister    Asthma Brother    Hypertension Maternal Grandmother    Diabetes Maternal Grandmother    Diabetes Mother    Hypertension Maternal Aunt    Diabetes Maternal Uncle     Social History Social History   Tobacco Use   Smoking status: Current Every Day Smoker    Packs/day: 0.50    Years: 15.00    Pack years: 7.50    Types: Cigarettes    Last attempt to quit: 02/22/2016    Years since quitting: 3.5   Smokeless tobacco: Former Neurosurgeon  Substance Use Topics   Alcohol use: No   Drug use: Yes    Types: Marijuana    Comment: last used Marijuana in February 2018     Allergies   Patient has no known allergies.   Review of Systems Review of Systems  Constitutional: Positive for appetite change and fatigue. Negative for activity change, chills and fever.  HENT: Negative for congestion, ear pain, rhinorrhea, sinus pressure, sore throat and trouble swallowing.   Eyes: Negative for discharge and redness.  Respiratory: Positive for shortness of breath. Negative for cough and chest tightness.   Cardiovascular: Negative for chest pain.  Gastrointestinal: Positive for abdominal pain, diarrhea, nausea and vomiting.  Musculoskeletal: Negative for myalgias.  Skin: Negative for rash.  Neurological: Negative for dizziness, light-headedness and headaches.     Physical Exam Triage Vital Signs ED Triage Vitals  Enc Vitals Group     BP 08/28/19 1106 127/89     Pulse Rate 08/28/19 1106 99     Resp 08/28/19 1106 18     Temp 08/28/19 1106 98.2 F (36.8 C)     Temp Source 08/28/19 1106 Oral     SpO2 08/28/19 1106 99 %     Weight --      Height --      Head Circumference --       Peak Flow --      Pain Score 08/28/19 1102 0     Pain Loc --      Pain Edu? --      Excl. in [place]? --    No data found.  Updated Vital Signs BP 127/89 (  BP Location: Right Arm)    Pulse 99    Temp 98.2 F (36.8 C) (Oral)    Resp 18    LMP 08/28/2019    SpO2 99%   Visual Acuity Right Eye Distance:   Left Eye Distance:   Bilateral Distance:    Right Eye Near:   Left Eye Near:    Bilateral Near:     Physical Exam Vitals and nursing note reviewed.  Constitutional:      Appearance: She is well-developed.     Comments: No acute distress  HENT:     Head: Normocephalic and atraumatic.     Ears:     Comments: Bilateral ears without tenderness to palpation of external auricle, tragus and mastoid, EAC's without erythema or swelling, TM's with good bony landmarks and cone of light. Non erythematous.     Nose: Nose normal.     Mouth/Throat:     Comments: Oral mucosa pink and moist, no tonsillar enlargement or exudate. Posterior pharynx patent and nonerythematous, no uvula deviation or swelling. Normal phonation.  Eyes:     Conjunctiva/sclera: Conjunctivae normal.  Cardiovascular:     Rate and Rhythm: Normal rate.  Pulmonary:     Effort: Pulmonary effort is normal. No respiratory distress.     Comments: Breathing comfortably at rest, CTABL, no wheezing, rales or other adventitious sounds auscultated  Abdominal:     General: There is no distension.     Comments: Soft, nondistended, nontender to light and deep palpation throughout abdomen  Musculoskeletal:        General: Normal range of motion.     Cervical back: Neck supple.  Skin:    General: Skin is warm and dry.  Neurological:     Mental Status: She is alert and oriented to person, place, and time.      UC Treatments / Results  Labs (all labs ordered are listed, but only abnormal results are displayed) Labs Reviewed  SARS CORONAVIRUS 2 (TAT 6-24 HRS)    EKG   Radiology No results found.  Procedures Procedures  (including critical care time)  Medications Ordered in UC Medications  ondansetron (ZOFRAN-ODT) disintegrating tablet 4 mg (4 mg Oral Given 08/28/19 1145)    Initial Impression / Assessment and Plan / UC Course  I have reviewed the triage vital signs and the nursing notes.  Pertinent labs & imaging results that were available during my care of the patient were reviewed by me and considered in my medical decision making (see chart for details).     Covid test pending.  Suspect nausea vomiting diarrhea likely viral etiology, negative peritoneal signs, do not suspect abdominal emergency at this time.  Recommending symptomatic and supportive care with Zofran and Bentyl as needed, push fluids and rehydrate.  Refilling albuterol and Symbicort inhalers for asthma, 5-day course of prednisone for chest inflammation/wheezing.  Continue to monitor breathing and GI symptoms,Discussed strict return precautions. Patient verbalized understanding and is agreeable with plan.  Final Clinical Impressions(s) / UC Diagnoses   Final diagnoses:  Nausea vomiting and diarrhea  Moderate persistent asthma, unspecified whether complicated     Discharge Instructions     Covid test pending, monitor my chart for results Begin prednisone daily for 5 days to help with asthma Albuterol and Symbicort refilled Zofran for nausea May use Bentyl before meals and bedtime to help with cramping Focus on fluids-Pedialyte/Gatorade May use some Imodium or Pepto-Bismol to help with diarrhea Please follow-up if any symptoms not improving or worsening  ED Prescriptions    Medication Sig Dispense Auth. Provider   albuterol (VENTOLIN HFA) 108 (90 Base) MCG/ACT inhaler Inhale 1-2 puffs into the lungs every 6 (six) hours as needed for wheezing or shortness of breath. 18 g Shaunika Italiano C, PA-C   budesonide-formoterol (SYMBICORT) 160-4.5 MCG/ACT inhaler Inhale 2 puffs into the lungs 2 (two) times daily. 10.2 g Tommie Bohlken  C, PA-C   predniSONE (DELTASONE) 50 MG tablet Take 1 tablet (50 mg total) by mouth daily for 5 days. 5 tablet Ying [place] C, PA-C   ondansetron (ZOFRAN ODT) 4 MG disintegrating tablet Take 1-2 tablets (4-8 mg total) by mouth every 8 (eight) hours as needed for nausea or vomiting. 20 tablet Charliee Krenz C, PA-C   dicyclomine (BENTYL) 20 MG tablet Take 1 tablet (20 mg total) by mouth 4 (four) times daily -  before meals and at bedtime. 20 tablet Zaiyden Strozier, [place]thing[place]ie C, PA-C     PDMP not reviewed this encounter.   Lew DawesWieters, Jonaven Hilgers C, [place] JerseyPA-C 08/30/19 1845

## 2019-08-28 NOTE — ED Triage Notes (Signed)
Pt present N/V/D with weakness, symptoms started 4 days ago. Pt states that she been having difficulty breathing and has a history of asthma along with other symptoms.

## 2019-09-14 ENCOUNTER — Ambulatory Visit: Payer: Medicaid Other

## 2019-10-18 ENCOUNTER — Ambulatory Visit (HOSPITAL_COMMUNITY)
Admission: EM | Admit: 2019-10-18 | Discharge: 2019-10-18 | Disposition: A | Discharge status: Home / Self Care | Payer: Medicaid Other | Attending: Family Medicine | Admitting: Family Medicine

## 2019-10-18 ENCOUNTER — Encounter (HOSPITAL_COMMUNITY): Payer: Self-pay

## 2019-10-18 ENCOUNTER — Other Ambulatory Visit: Payer: Self-pay

## 2019-10-18 DIAGNOSIS — J019 Acute sinusitis, unspecified: Secondary | ICD-10-CM | POA: Insufficient documentation

## 2019-10-18 DIAGNOSIS — Z20822 Contact with and (suspected) exposure to covid-19: Secondary | ICD-10-CM | POA: Insufficient documentation

## 2019-10-18 DIAGNOSIS — J4521 Mild intermittent asthma with (acute) exacerbation: Secondary | ICD-10-CM | POA: Insufficient documentation

## 2019-10-18 DIAGNOSIS — R05 Cough: Secondary | ICD-10-CM | POA: Diagnosis present

## 2019-10-18 DIAGNOSIS — F17210 Nicotine dependence, cigarettes, uncomplicated: Secondary | ICD-10-CM | POA: Insufficient documentation

## 2019-10-18 DIAGNOSIS — I10 Essential (primary) hypertension: Secondary | ICD-10-CM | POA: Diagnosis not present

## 2019-10-18 DIAGNOSIS — Z79899 Other long term (current) drug therapy: Secondary | ICD-10-CM | POA: Insufficient documentation

## 2019-10-18 MED ORDER — IPRATROPIUM-ALBUTEROL 0.5-2.5 (3) MG/3ML IN SOLN: 3.0000 mL | RESPIRATORY_TRACT | 0 refills | Status: AC | PRN

## 2019-10-18 MED ORDER — FLUTICASONE PROPIONATE 50 MCG/ACT NA SUSP: 1.0000 | Freq: Every day | NASAL | 0 refills | Status: DC

## 2019-10-18 MED ORDER — PREDNISONE 50 MG PO TABS: 50.0000 mg | ORAL_TABLET | Freq: Every day | ORAL | 0 refills | Status: DC

## 2019-10-18 MED ORDER — DOXYCYCLINE HYCLATE 100 MG PO CAPS: 100.0000 mg | ORAL_CAPSULE | Freq: Two times a day (BID) | ORAL | 0 refills | Status: DC

## 2019-10-18 MED ORDER — BUDESONIDE-FORMOTEROL FUMARATE 160-4.5 MCG/ACT IN AERO: 2.0000 | INHALATION_SPRAY | Freq: Two times a day (BID) | RESPIRATORY_TRACT | 0 refills | Status: AC

## 2019-10-18 MED ORDER — BENZONATATE 200 MG PO CAPS: 200.0000 mg | ORAL_CAPSULE | Freq: Three times a day (TID) | ORAL | 0 refills | Status: AC | PRN

## 2019-10-18 MED ORDER — ALBUTEROL SULFATE HFA 108 (90 BASE) MCG/ACT IN AERS: 1.0000 | INHALATION_SPRAY | Freq: Four times a day (QID) | RESPIRATORY_TRACT | 0 refills | Status: AC | PRN

## 2019-10-18 NOTE — ED Provider Notes (Signed)
[facility]    CSN: [id number] Arrival date & time: 10/18/19  1003      History   Chief Complaint Chief Complaint  Patient presents with  . Cough    x 1 week  . Nasal Congestion    x 1 week    HPI Selena Haynes is a 34 y.o. female presenting today for evaluation of URI symptoms and cough.  Patient reports for over 1 week she has had cough, congestion, shortness of breath and sinus pressure.  She is also also had sore throat.  She previously was having GI symptoms, but this is resolved and appetite has been improving.  She has felt her asthma has flared and has had some wheezing at times.  Requesting refill nebulizers and inhalers.  Reports 2 kids sick with similar symptoms recently.  Denies fevers.  Reported worsening wheezing at nighttime.   HPI  Past Medical History:  Diagnosis Date  . Anxiety   . Asthma   . Back pain   . Carpal tunnel syndrome   . Hypertension   . Post traumatic stress disorder (PTSD)   . Postpartum depression     Patient Active Problem List   Diagnosis Date Noted  . Cough variant asthma 01/29/2018  . Bronchitis with asthma, acute 01/09/2018  . Thrush, oral 01/09/2018  . Rhinitis, nonallergic, chronic 09/24/2016  . GERD without esophagitis 09/24/2016  . Carpal tunnel syndrome, right upper limb 05/22/2016  . Anxiety disorder affecting pregnancy, antepartum 05/22/2016  . Previous cesarean delivery affecting pregnancy, antepartum 02/26/2016    Past Surgical History:  Procedure Laterality Date  . CESAREAN SECTION      OB History    Gravida  5   Para  3   Term  3   Preterm  0   AB  2   Living  3     SAB  2   TAB  0   Ectopic  0   Multiple  0   Live Births  3            Home Medications    Prior to Admission medications   Medication Sig Start Date End Date Taking? Authorizing Provider  cyclobenzaprine (FLEXERIL) 10 MG tablet Take 1 tablet (10 mg total) by mouth 2 (two) times daily as needed for muscle  spasms. 02/14/19  Yes Lamptey, Britta Mccreedy, MD  pantoprazole (PROTONIX) 40 MG tablet Take 1 tablet (40 mg total) by mouth daily. Take 30-60 min before first meal of the day 01/29/18  Yes Nyoka Cowden, MD  acetaminophen (TYLENOL) 325 MG tablet Take 2 tablets (650 mg total) by mouth every 4 (four) hours as needed (for pain scale < 4). 10/05/16   Winfrey, Harlen Labs, MD  albuterol (VENTOLIN HFA) 108 (90 Base) MCG/ACT inhaler Inhale 1-2 puffs into the lungs every 6 (six) hours as needed for wheezing or shortness of breath. 10/18/19   Gerrald Basu C, PA-C  benzonatate (TESSALON) 200 MG capsule Take 1 capsule (200 mg total) by mouth 3 (three) times daily as needed for up to 7 days for cough. 10/18/19 10/25/19  Jamielyn Petrucci C, PA-C  budesonide-formoterol (SYMBICORT) 160-4.5 MCG/ACT inhaler Inhale 2 puffs into the lungs 2 (two) times daily. 10/18/19   Jericha Bryden C, PA-C  cetirizine (ZYRTEC) 10 MG tablet Take 10 mg by mouth daily as needed for allergies.     [provider]  dicyclomine (BENTYL) 20 MG tablet Take 1 tablet (20 mg total) by mouth 4 (four)  times daily -  before meals and at bedtime. 08/28/19   Meadow Abramo C, PA-C  doxycycline (VIBRAMYCIN) 100 MG capsule Take 1 capsule (100 mg total) by mouth 2 (two) times daily. 10/18/19   Andrya Roppolo C, PA-C  fluticasone (FLONASE) 50 MCG/ACT nasal spray Place 1-2 sprays into both nostrils daily for 7 days. 10/18/19 10/25/19  Scout Gumbs C, PA-C  hydrocortisone cream 1 % Apply 1 application topically 2 (two) times daily as needed for itching. otc medication for itching    [provider]  hydrOXYzine (VISTARIL) 25 MG capsule Take 25 mg by mouth 3 (three) times daily as needed for itching.     [provider]  ibuprofen (ADVIL) 600 MG tablet Take 1 tablet (600 mg total) by mouth every 6 (six) hours as needed. 02/14/19   Lamptey, Britta MccreedyPhilip O, MD  ipratropium-albuterol (DUONEB) 0.5-2.5 (3) MG/3ML SOLN Take 3 mLs by nebulization every 4  (four) hours as needed. 10/18/19   Conny Situ C, PA-C  ondansetron (ZOFRAN ODT) 4 MG disintegrating tablet Take 1-2 tablets (4-8 mg total) by mouth every 8 (eight) hours as needed for nausea or vomiting. 08/28/19   Elaina Cara C, PA-C  predniSONE (DELTASONE) 50 MG tablet Take 1 tablet (50 mg total) by mouth daily with breakfast. 10/18/19   Kraven Calk, Junius CreamerHallie C, PA-C    Family History Family History  Problem Relation Age of Onset  . Asthma Sister   . Asthma Brother   . Hypertension Maternal Grandmother   . Diabetes Maternal Grandmother   . Diabetes Mother   . Hypertension Maternal Aunt   . Diabetes Maternal Uncle     Social History Social History   Tobacco Use  . Smoking status: Current Every Day Smoker    Packs/day: 0.50    Years: 15.00    Pack years: 7.50    Types: Cigarettes    Last attempt to quit: 02/22/2016    Years since quitting: 3.6  . Smokeless tobacco: Former Engineer, waterUser  Substance Use Topics  . Alcohol use: No  . Drug use: Yes    Types: Marijuana    Comment: last used Marijuana in February 2018     Allergies   Patient has no known allergies.   Review of Systems Review of Systems  Constitutional: Negative for activity change, appetite change, chills, fatigue and fever.  HENT: Positive for congestion, rhinorrhea, sinus pressure and sore throat. Negative for ear pain and trouble swallowing.   Eyes: Negative for discharge and redness.  Respiratory: Positive for cough, chest tightness and shortness of breath.   Cardiovascular: Negative for chest pain.  Gastrointestinal: Negative for abdominal pain, diarrhea, nausea and vomiting.  Musculoskeletal: Negative for myalgias.  Skin: Negative for rash.  Neurological: Negative for dizziness, light-headedness and headaches.     Physical Exam Triage Vital Signs ED Triage Vitals  Enc Vitals Group     BP      Pulse      Resp      Temp      Temp src      SpO2      Weight      Height      Head Circumference       Peak Flow      Pain Score      Pain Loc      Pain Edu?      Excl. in GC?    No data found.  Updated Vital Signs BP 124/71 (BP Location: Right Arm)  Pulse 86   Temp 98.3 F (36.8 C) (Oral)   Resp (!) 21   LMP  (Approximate)   SpO2 96%   Visual Acuity Right Eye Distance:   Left Eye Distance:   Bilateral Distance:    Right Eye Near:   Left Eye Near:    Bilateral Near:     Physical Exam Vitals and nursing note reviewed.  Constitutional:      Appearance: She is well-developed.     Comments: No acute distress  HENT:     Head: Normocephalic and atraumatic.     Ears:     Comments: Bilateral ears without tenderness to palpation of external auricle, tragus and mastoid, EAC's without erythema or swelling, TM's with good bony landmarks and cone of light. Non erythematous.     Nose: Nose normal.     Mouth/Throat:     Comments: Oral mucosa pink and moist, no tonsillar enlargement or exudate. Posterior pharynx patent and nonerythematous, no uvula deviation or swelling. Normal phonation. Eyes:     Conjunctiva/sclera: Conjunctivae normal.  Cardiovascular:     Rate and Rhythm: Normal rate.  Pulmonary:     Effort: Pulmonary effort is normal. No respiratory distress.     Comments: Appears slightly short of breath with talking, no wheezing or rales auscultated, but does have coarse cough Abdominal:     General: There is no distension.  Musculoskeletal:        General: Normal range of motion.     Cervical back: Neck supple.  Skin:    General: Skin is warm and dry.  Neurological:     Mental Status: She is alert and oriented to person, place, and time.      UC Treatments / Results  Labs (all labs ordered are listed, but only abnormal results are displayed) Labs Reviewed  SARS CORONAVIRUS 2 (TAT 6-24 HRS)    EKG   Radiology No results found.  Procedures Procedures (including critical care time)  Medications Ordered in UC Medications - No data to display  Initial  Impression / Assessment and Plan / UC Course  I have reviewed the triage vital signs and the nursing notes.  Pertinent labs & imaging results that were available during my care of the patient were reviewed by me and considered in my medical decision making (see chart for details).     1.  URI symptoms-Covid PCR pending, symptoms greater than 1 week, treating for sinusitis with doxycycline, continue symptomatic and supportive care as well  2.  Asthma exacerbation-refilled nebulizers and inhalers, 5-day course of prednisone  Discussed strict return precautions. Patient verbalized understanding and is agreeable with plan.  Final Clinical Impressions(s) / UC Diagnoses   Final diagnoses:  Acute sinusitis with symptoms > 10 days  Mild intermittent asthma with exacerbation     Discharge Instructions     Begin doxycycline twice daily for 1 week Prednisone daily for 5 Days  albuterol inhaler, Symbicort inhaler and nebulizers Tessalon/benzonatate every 8 hours for cough Flonase nasal spray 1 to 2 spray in each nostril daily Follow-up if not improving or worsening    ED Prescriptions    Medication Sig Dispense Auth. Provider   ipratropium-albuterol (DUONEB) 0.5-2.5 (3) MG/3ML SOLN Take 3 mLs by nebulization every 4 (four) hours as needed. 360 mL Shriyan Arakawa C, PA-C   albuterol (VENTOLIN HFA) 108 (90 Base) MCG/ACT inhaler Inhale 1-2 puffs into the lungs every 6 (six) hours as needed for wheezing or shortness of breath. 18 g Lendell Gallick, [place]  C, PA-C   doxycycline (VIBRAMYCIN) 100 MG capsule Take 1 capsule (100 mg total) by mouth 2 (two) times daily. 20 capsule Carling Liberman C, PA-C   benzonatate (TESSALON) 200 MG capsule Take 1 capsule (200 mg total) by mouth 3 (three) times daily as needed for up to 7 days for cough. 28 capsule Kady Toothaker C, PA-C   predniSONE (DELTASONE) 50 MG tablet Take 1 tablet (50 mg total) by mouth daily with breakfast. 5 tablet Jensine Luz C, PA-C    budesonide-formoterol (SYMBICORT) 160-4.5 MCG/ACT inhaler Inhale 2 puffs into the lungs 2 (two) times daily. 10.2 g Terina Mcelhinny C, PA-C   fluticasone (FLONASE) 50 MCG/ACT nasal spray Place 1-2 sprays into both nostrils daily for 7 days. 1 g Sondos Wolfman, [place] C, PA-C     PDMP not reviewed this encounter.   Lew Dawes, [place] 10/18/19 1415

## 2019-10-18 NOTE — ED Triage Notes (Signed)
Pt states she has had a cough and congestion x 1 week worsening over time. Pt states it is also worse when she talks or tries to lay down. Pt is aox4 and ambulatory.

## 2019-10-18 NOTE — Discharge Instructions (Addendum)
Begin doxycycline twice daily for 1 week Prednisone daily for 5 Days  albuterol inhaler, Symbicort inhaler and nebulizers Tessalon/benzonatate every 8 hours for cough Flonase nasal spray 1 to 2 spray in each nostril daily Follow-up if not improving or worsening

## 2019-10-19 LAB — SARS CORONAVIRUS 2 (TAT 6-24 HRS): SARS Coronavirus 2: NEGATIVE

## 2019-10-26 ENCOUNTER — Ambulatory Visit (INDEPENDENT_AMBULATORY_CARE_PROVIDER_SITE_OTHER): Payer: Medicaid Other | Admitting: Pulmonary Disease

## 2019-10-26 ENCOUNTER — Ambulatory Visit (INDEPENDENT_AMBULATORY_CARE_PROVIDER_SITE_OTHER): Payer: Medicaid Other

## 2019-10-26 ENCOUNTER — Other Ambulatory Visit: Payer: Self-pay

## 2019-10-26 ENCOUNTER — Encounter: Payer: Self-pay | Admitting: Pulmonary Disease

## 2019-10-26 VITALS — BP 124/80 | HR 98 | Temp 97.9°F | Ht 66.0 in | Wt 151.4 lb

## 2019-10-26 DIAGNOSIS — K219 Gastro-esophageal reflux disease without esophagitis: Secondary | ICD-10-CM

## 2019-10-26 DIAGNOSIS — F17210 Nicotine dependence, cigarettes, uncomplicated: Secondary | ICD-10-CM | POA: Diagnosis not present

## 2019-10-26 DIAGNOSIS — F17200 Nicotine dependence, unspecified, uncomplicated: Secondary | ICD-10-CM | POA: Diagnosis not present

## 2019-10-26 DIAGNOSIS — J309 Allergic rhinitis, unspecified: Secondary | ICD-10-CM | POA: Diagnosis not present

## 2019-10-26 DIAGNOSIS — J45991 Cough variant asthma: Secondary | ICD-10-CM

## 2019-10-26 DIAGNOSIS — Z0000 Encounter for general adult medical examination without abnormal findings: Secondary | ICD-10-CM | POA: Insufficient documentation

## 2019-10-26 MED ORDER — CETIRIZINE HCL 10 MG PO TABS: 10.0000 mg | ORAL_TABLET | Freq: Every day | ORAL | 12 refills | Status: AC

## 2019-10-26 MED ORDER — PREDNISONE 10 MG PO TABS: ORAL_TABLET | ORAL | 0 refills | Status: DC

## 2019-10-26 MED ORDER — PANTOPRAZOLE SODIUM 40 MG PO TBEC: 40.0000 mg | DELAYED_RELEASE_TABLET | Freq: Every day | ORAL | 3 refills | Status: DC

## 2019-10-26 MED ORDER — METHYLPREDNISOLONE ACETATE 80 MG/ML IJ SUSP
80.0000 mg | Freq: Once | INTRAMUSCULAR | Status: AC
  Administered 2019-10-26: 80 mg via INTRAMUSCULAR

## 2019-10-26 NOTE — Assessment & Plan Note (Signed)
Plan: Start PPI Referral to primary care

## 2019-10-26 NOTE — Assessment & Plan Note (Signed)
Plan: Start daily antihistamine Start Flonase

## 2019-10-26 NOTE — Assessment & Plan Note (Signed)
Current smoker Last cigarette was 2 to 3 weeks ago given acute illness Patient is actively working on stopping smoking  Plan: Praised patient on current smoking cessation Emphasized need to continue to not smoke

## 2019-10-26 NOTE — Assessment & Plan Note (Addendum)
Plan: Depo 80 today Start prednisone taper tomorrow Chest x-ray today Continue Symbicort 160 Restart daily antihistamine Start Flonase 1 spray each nostril Restart daily PPI Emphasized need to stop smoking 4-week follow-up with Dr. Sherene Sires Start Delsym every 12 hours as needed for cough management >>>samples given today

## 2019-10-26 NOTE — Patient Instructions (Addendum)
You were seen today by Coral Ceo, NP  for:   1. Cough variant asthma  - DG Chest 2 View; Future - Ambulatory referral to Internal Medicine - pantoprazole (PROTONIX) 40 MG tablet; Take 1 tablet (40 mg total) by mouth daily. Take 30-60 min before first meal of the day  Dispense: 30 tablet; Refill: 3 - cetirizine (ZYRTEC) 10 MG tablet; Take 1 tablet (10 mg total) by mouth daily.  Dispense: 30 tablet; Refill: 12 - DG Chest 2 View; Future - predniSONE (DELTASONE) 10 MG tablet; 4 tabs for 2 days, then 3 tabs for 2 days, 2 tabs for 2 days, then 1 tab for 2 days, then stop  Dispense: 20 tablet; Refill: 0 - methylPREDNISolone acetate (DEPO-MEDROL) injection 80 mg  Depo 80 today  Prednisone 10mg  tablet  >>>4 tabs for 2 days, then 3 tabs for 2 days, 2 tabs for 2 days, then 1 tab for 2 days, then stop >>>take with food  >>>take in the morning  >>>start 10/27/19  Continue Symbicort 160 >>> 2 puffs in the morning right when you wake up, rinse out your mouth after use, 12 hours later 2 puffs, rinse after use >>> Take this daily, no matter what >>> This is not a rescue inhaler    Cough Home Instructions:  We believe you have a chronic/cyclical cough that is aggravated by reflux , coughing , and drainage.  . Goal is to not Cough or clear throat.  12/27/19 Avoid coughing or clearing throat by using:  o non-mint products/sugarless candy o Water o ice chips o Remember NO MINT PRODUCTS  . Medications to use:  o Mucinex DM 1-2 every 12 hrs or Delsym 2 tsp every 12 hrs for cough (These are Over the counter) o Tessalon Three times a day  As needed  Cough.  o Protonix 40 mg before breakfast o Zyrtec 10mg  at bedtime (Can use generic, this is over the counter) o Chlor tabs 4mg  2 at bedtime  for nasal drip until cough is 100% cough free. (this medication is over the counter)     2. Allergic rhinitis, unspecified seasonality, unspecified trigger  - cetirizine (ZYRTEC) 10 MG tablet; Take 1 tablet (10 mg  total) by mouth daily.  Dispense: 30 tablet; Refill: 12  Please start taking a daily antihistamine:  >>>choose one of: zyrtec, claritin, allegra, or xyzal  >>>these are over the counter medications  >>>can choose generic option  >>>take daily  >>>this medication helps with allergies, post nasal drip, and cough   Start Flonase 1 spray each nostril daily  3. GERD without esophagitis  - pantoprazole (PROTONIX) 40 MG tablet; Take 1 tablet (40 mg total) by mouth daily. Take 30-60 min before first meal of the day  Dispense: 30 tablet; Refill: 3  Protonix 40 mg tablet  >>>Please take 1 tablet daily 15 minutes to 30 minutes before your first meal of the day as well as before your other medications >>>Try to take at the same time each day >>>take this medication daily  GERD management: >>>Avoid laying flat until 2 hours after meals >>>Elevate head of the bed including entire chest >>>Reduce size of meals and amount of fat, acid, spices, caffeine and sweets >>>If you are smoking, Please stop! >>>Decrease alcohol consumption >>>Work on maintaining a healthy weight with normal BMI    4. Current smoker  We recommend that you stop smoking.  >>>You need to set a quit date >>>If you have friends or family who smoke, let  them know you are trying to quit and not to smoke around you or in your living environment  Smoking Cessation Resources:  1 800 QUIT NOW  >>> Patient to call this resource and utilize it to help support her quit smoking >>> Keep up your hard work with stopping smoking  You can also contact the [facility] >>>For smoking cessation classes call [phone]  We do not recommend using e-cigarettes as a form of stopping smoking  You can sign up for smoking cessation support texts and information:  >>>https://smokefree.gov/smokefreetxt   5. Healthcare maintenance  We do recommend that you obtain the seasonal flu vaccine in fall/2021  We recommend you  obtain COVID-19 vaccinations  Need to wait until you have recovered from your current illness before obtaining these vaccinations   We recommend today:  Orders Placed This Encounter  Procedures  . DG Chest 2 View    Standing Status:   Future    Number of Occurrences:   1    Standing Expiration Date:   02/26/2020    Order Specific Question:   Reason for Exam (SYMPTOM  OR DIAGNOSIS REQUIRED)    Answer:   cough    Order Specific Question:   Preferred imaging location?    Answer:   Internal    Order Specific Question:   Radiology Contrast Protocol - do NOT remove file path    Answer:   [email or web address]  . DG Chest 2 View    Standing Status:   Future    Standing Expiration Date:   02/26/2020    Order Specific Question:   Reason for Exam (SYMPTOM  OR DIAGNOSIS REQUIRED)    Answer:   cough    Order Specific Question:   Preferred imaging location?    Answer:   Internal    Order Specific Question:   Radiology Contrast Protocol - do NOT remove file path    Answer:   [email or web address]  . Ambulatory referral to Internal Medicine    Referral Priority:   Routine    Referral Type:   Consultation    Referral Reason:   Specialty Services Required    Requested Specialty:   Internal Medicine    Number of Visits Requested:   1   Orders Placed This Encounter  Procedures  . DG Chest 2 View  . DG Chest 2 View  . Ambulatory referral to Internal Medicine   Meds ordered this encounter  Medications  . pantoprazole (PROTONIX) 40 MG tablet    Sig: Take 1 tablet (40 mg total) by mouth daily. Take 30-60 min before first meal of the day    Dispense:  30 tablet    Refill:  3  . cetirizine (ZYRTEC) 10 MG tablet    Sig: Take 1 tablet (10 mg total) by mouth daily.    Dispense:  30 tablet    Refill:  12  . predniSONE (DELTASONE) 10 MG tablet    Sig: 4 tabs for 2 days, then 3 tabs for 2 days, 2 tabs  for 2 days, then 1 tab for 2 days, then stop    Dispense:  20 tablet    Refill:  0  . methylPREDNISolone acetate (DEPO-MEDROL) injection 80 mg    Follow Up:    Return in about 4 weeks (around 11/23/2019), or if symptoms worsen or fail to improve, for Follow up with Dr. Sherene Sires. 4 to 6-week follow-up with Dr. Sherene Sires in [place] office  Notification of test results are managed in  the following manner: If there are  any recommendations or changes to the  plan of care discussed in office today,  we will contact you and let you know what they are. If you do not hear from [place], then your results are normal and you can view them through your  MyChart account , or a letter will be sent to you. Thank you again for trusting [place] with your care  - Thank you, [facility]    It is flu season:   >>> Best ways to protect herself from the flu: Receive the yearly flu vaccine, practice good hand hygiene washing with soap and also using hand sanitizer when available, eat a nutritious meals, get adequate rest, hydrate appropriately       Please contact the office if your symptoms worsen or you have concerns that you are not improving.   Thank you for choosing [facility] for your healthcare, and for allowing [place] to partner with you on your healthcare journey. I am thankful to be able to provide care to you today.   Elisha Headland FNP-C

## 2019-10-26 NOTE — Assessment & Plan Note (Signed)
Plan: Obtain seasonal flu vaccine in fall/2021 We recommend patient obtain COVID-19 vaccinations Patient should recover from current acute illness before obtaining these vaccinations

## 2019-10-26 NOTE — Progress Notes (Signed)
@Patient  ID: , female    DOB: 1985/01/31, 34 y.o.   MRN: [id number]  Chief Complaint  Patient presents with  . Follow-up    Asthma    Referring provider: [id number], MD  HPI:  34 year old female former smoker followed in our office for asthma  Smoker/ Smoking History: Former smoker.  Quit 2018.  7-1/2 pack years. Maintenance: Symbicort 160 Pt of: Dr. 2019  10/26/2019  - Visit   34 year old female current smoker followed in our office for asthma.  Followed in our office by Dr. 31.  Patient was last seen by Dr. Sherene Sires in January/2020.  Since last being seen patient has had multiple emergency room/urgent care visits.  Most recent being 10/18/2019.  Patient was treated with doxycycline and a course of prednisone and given Tessalon Perles.  Patient did not have an x-ray at that time.  Patient was treated as is acute sinusitis with doxycycline.  We will discuss this today.  Patient feels that symptomatically she has improved slightly but still feels ill.  She has not yet received her flu vaccine.  She is also unvaccinated for Covid.  She remains adherent to her Symbicort 160.  Patient has been having use her rescue inhaler 3-4 times a day.  She is requesting a work note stating that she is followed in our office for asthma.  She reports that she is still a current smoker but has not smoked a cigarette over the last 2 to 3 weeks given her acute symptoms.  Her most recent test results on 10/18/2019 shows that she is negative to SARS-CoV-2.  Patient is requesting referral to establish with a new primary care provider.  Patient is also out of her daily antihistamine as well as PPI.  Questionaires / Pulmonary Flowsheets:   ACT:  Asthma Control Test ACT Total Score  10/26/2019 7    MMRC: No flowsheet data found.  Epworth:  No flowsheet data found.  Tests:   10/18/19 - 10/20/19 cov2 - negative   FENO:  No results found for: NITRICOXIDE  PFT: No flowsheet data  found.  WALK:  No flowsheet data found.  Imaging: No results found.  Lab Results:  CBC    Component Value Date/Time   WBC 8.3 10/03/2016 0800   RBC 4.10 10/03/2016 0800   HGB 12.3 10/03/2016 0800   HGB 11.8 07/15/2016 0848   HCT 34.6 (L) 10/03/2016 0800   HCT 36.0 07/15/2016 0848   PLT 194 10/03/2016 0800   PLT 226 07/15/2016 0848   MCV 84.4 10/03/2016 0800   MCV 90 07/15/2016 0848   MCH 30.0 10/03/2016 0800   MCHC 35.5 10/03/2016 0800   RDW 13.8 10/03/2016 0800   RDW 13.6 07/15/2016 0848   LYMPHSABS 1.6 04/22/2016 1111   MONOABS 0.7 04/22/2016 1111   EOSABS 0.2 04/22/2016 1111   BASOSABS 0.0 04/22/2016 1111    BMET    Component Value Date/Time   NA 136 04/22/2016 1111   K 3.4 (L) 04/22/2016 1111   CL 103 04/22/2016 1111   CO2 23 04/22/2016 1111   GLUCOSE 135 (H) 04/22/2016 1111   BUN 6 04/22/2016 1111   CREATININE 0.65 04/22/2016 1111   CREATININE 0.74 02/26/2016 1433   CALCIUM 9.4 04/22/2016 1111   GFRNONAA >60 04/22/2016 1111   GFRAA >60 04/22/2016 1111    BNP No results found for: BNP  ProBNP No results found for: PROBNP  Specialty Problems      Pulmonary Problems   Rhinitis, nonallergic,  chronic    Trial of breathe right strips/ nasal saline       Bronchitis with asthma, acute   Cough variant asthma    Quit smoking 02/2016 08/22/2016   try symbicort 160 2bid as breaking rule of 2's on qvar 80 2bid > improved 09/24/2016 so rec continue symb 160 2bid thru pregancy and f/u in 3 m for ? Stepdown then  - 01/29/2018  After extensive coaching inhaler device,  effectiveness =    90% from a baseline of 25%   DDX of  difficult airways management almost all start with A and  include Adherence, Ace Inhibitors, Acid Reflux, Active Sinus Disease, Alpha 1 Antitripsin deficiency, Anxiety masquerading as Airways dz,  ABPA,  Allergy(esp in young), Aspiration (esp in elderly), Adverse effects of meds,  Active smoking or vaping, A bunch of PE's (a small clot burden  can't cause this syndrome unless there is already severe underlying pulm or vascular dz with poor reserve) plus two Bs  = Bronchiectasis and Beta blocker use..and one C= CHF         Allergic rhinitis      No Known Allergies  Immunization History  Administered Date(s) Administered  . Tdap 07/15/2016   Recommend seasonal flu vaccine as well as COVID-19 vaccinations once patient recovers from current acute illness  Past Medical History:  Diagnosis Date  . Anxiety   . Asthma   . Back pain   . Carpal tunnel syndrome   . Hypertension   . Post traumatic stress disorder (PTSD)   . Postpartum depression     Tobacco History: Social History   Tobacco Use  Smoking Status Current Every Day Smoker  . Packs/day: 0.50  . Years: 21.00  . Pack years: 10.50  . Types: Cigarettes  . Start date: 01/21/1998  Smokeless Tobacco Former User  Tobacco Comment   2-3 cigarettes a day, last was 2-3 weeks ago    Ready to quit: Yes Counseling given: Yes Comment: 2-3 cigarettes a day, last was 2-3 weeks ago   Smoking assessment and cessation counseling  Patient currently smoking: 2 to 3 cigarettes a day, last cigarette was 2 to 3 weeks ago I have advised the patient to quit/stop smoking as soon as possible due to high risk for multiple medical problems.  It will also be very difficult for [place] to manage patient's  respiratory symptoms and status if we continue to expose her lungs to a known irritant.  We do not advise e-cigarettes as a form of stopping smoking.  Patient is  willing to quit smoking.  Praised patient on current success.  Emphasized need for the patient to continue to not smoke even once she is recovered from current acute illness  I have advised the patient that we can assist and have options of nicotine replacement therapy, provided smoking cessation education today, provided smoking cessation counseling, and provided cessation resources.  Follow-up next office visit office visit for  assessment of smoking cessation.     Smoking cessation counseling advised for: 4 min     Outpatient Encounter Medications as of 10/26/2019  Medication Sig  . acetaminophen (TYLENOL) 325 MG tablet Take 2 tablets (650 mg total) by mouth every 4 (four) hours as needed (for pain scale < 4).  . albuterol (VENTOLIN HFA) 108 (90 Base) MCG/ACT inhaler Inhale 1-2 puffs into the lungs every 6 (six) hours as needed for wheezing or shortness of breath.  . budesonide-formoterol (SYMBICORT) 160-4.5 MCG/ACT inhaler Inhale 2 puffs into  the lungs 2 (two) times daily.  . cetirizine (ZYRTEC) 10 MG tablet Take 1 tablet (10 mg total) by mouth daily.  . cyclobenzaprine (FLEXERIL) 10 MG tablet Take 1 tablet (10 mg total) by mouth 2 (two) times daily as needed for muscle spasms.  Marland Kitchen dicyclomine (BENTYL) 20 MG tablet Take 1 tablet (20 mg total) by mouth 4 (four) times daily -  before meals and at bedtime.  Marland Kitchen doxycycline (VIBRAMYCIN) 100 MG capsule Take 1 capsule (100 mg total) by mouth 2 (two) times daily.  . hydrocortisone cream 1 % Apply 1 application topically 2 (two) times daily as needed for itching. otc medication for itching  . hydrOXYzine (VISTARIL) 25 MG capsule Take 25 mg by mouth 3 (three) times daily as needed for itching.   Marland Kitchen ibuprofen (ADVIL) 600 MG tablet Take 1 tablet (600 mg total) by mouth every 6 (six) hours as needed.  Marland Kitchen ipratropium-albuterol (DUONEB) 0.5-2.5 (3) MG/3ML SOLN Take 3 mLs by nebulization every 4 (four) hours as needed.  . ondansetron (ZOFRAN ODT) 4 MG disintegrating tablet Take 1-2 tablets (4-8 mg total) by mouth every 8 (eight) hours as needed for nausea or vomiting.  . pantoprazole (PROTONIX) 40 MG tablet Take 1 tablet (40 mg total) by mouth daily. Take 30-60 min before first meal of the day  . [DISCONTINUED] cetirizine (ZYRTEC) 10 MG tablet Take 10 mg by mouth daily as needed for allergies.   . [DISCONTINUED] pantoprazole (PROTONIX) 40 MG tablet Take 1 tablet (40 mg total) by mouth  daily. Take 30-60 min before first meal of the day  . [DISCONTINUED] predniSONE (DELTASONE) 50 MG tablet Take 1 tablet (50 mg total) by mouth daily with breakfast.  . fluticasone (FLONASE) 50 MCG/ACT nasal spray Place 1-2 sprays into both nostrils daily for 7 days.  . predniSONE (DELTASONE) 10 MG tablet 4 tabs for 2 days, then 3 tabs for 2 days, 2 tabs for 2 days, then 1 tab for 2 days, then stop  . [EXPIRED] methylPREDNISolone acetate (DEPO-MEDROL) injection 80 mg    No facility-administered encounter medications on file as of 10/26/2019.     Review of Systems  Review of Systems  Constitutional: Positive for chills and fatigue. Negative for activity change and fever.  HENT: Positive for congestion (was yellow / black - now non productive). Negative for sinus pressure, sinus pain and sore throat.   Respiratory: Positive for cough, shortness of breath and wheezing.   Cardiovascular: Negative for chest pain and palpitations.  Gastrointestinal: Negative for diarrhea, nausea and vomiting.  Musculoskeletal: Negative for arthralgias.  Neurological: Negative for dizziness.  Psychiatric/Behavioral: Negative for sleep disturbance. The patient is not nervous/anxious.      Physical Exam  BP 124/80 (BP Location: Left Arm, Cuff Size: Normal)   Pulse 98   Temp 97.9 F (36.6 C) (Oral)   Ht 5\' 6"  (1.676 m)   Wt 151 lb 6.4 oz (68.7 kg)   SpO2 100%   BMI 24.44 kg/m   Wt Readings from Last 5 Encounters:  10/26/19 151 lb 6.4 oz (68.7 kg)  02/04/19 142 lb (64.4 kg)  01/29/18 147 lb (66.7 kg)  01/09/18 145 lb 9.6 oz (66 kg)  06/19/17 155 lb 6.4 oz (70.5 kg)    BMI Readings from Last 5 Encounters:  10/26/19 24.44 kg/m  02/04/19 22.92 kg/m  01/29/18 23.02 kg/m  01/09/18 22.63 kg/m  06/19/17 25.08 kg/m     Physical Exam Vitals and nursing note reviewed.  Constitutional:  General: She is not in acute distress.    Appearance: Normal appearance.  HENT:     Head: Normocephalic  and atraumatic.     Right Ear: Tympanic membrane, ear canal and external ear normal. There is no impacted cerumen.     Left Ear: Tympanic membrane, ear canal and external ear normal. There is no impacted cerumen.     Nose: Rhinorrhea present. No congestion.     Mouth/Throat:     Mouth: Mucous membranes are moist.     Pharynx: Oropharynx is clear.     Comments: Postnasal drip Eyes:     Pupils: Pupils are equal, round, and reactive to light.  Cardiovascular:     Rate and Rhythm: Normal rate and regular rhythm.     Pulses: Normal pulses.     Heart sounds: Normal heart sounds. No murmur heard.   Pulmonary:     Effort: Pulmonary effort is normal. No respiratory distress.     Breath sounds: No decreased air movement. Wheezing present. No decreased breath sounds or rales.     Comments: Frequent coughing episodes during evaluation with deep breaths Musculoskeletal:     Cervical back: Normal range of motion.  Skin:    General: Skin is warm and dry.     Capillary Refill: Capillary refill takes less than 2 seconds.  Neurological:     General: No focal deficit present.     Mental Status: She is alert and oriented to person, place, and time. Mental status is at baseline.     Gait: Gait normal.  Psychiatric:        Mood and Affect: Mood normal.        Behavior: Behavior normal.        Thought Content: Thought content normal.        Judgment: Judgment normal.       Assessment & Plan:   Cough variant asthma Plan: Depo 80 today Start prednisone taper tomorrow Chest x-ray today Continue Symbicort 160 Restart daily antihistamine Start Flonase 1 spray each nostril Restart daily PPI Emphasized need to stop smoking 4-week follow-up with Dr. Sherene SiresWert Start Delsym every 12 hours as needed for cough management >>>samples given today   Allergic rhinitis Plan: Start daily antihistamine Start Flonase  GERD without esophagitis Plan: Start PPI Referral to primary care  Healthcare  maintenance Plan: Obtain seasonal flu vaccine in fall/2021 We recommend patient obtain COVID-19 vaccinations Patient should recover from current acute illness before obtaining these vaccinations  Current smoker Current smoker Last cigarette was 2 to 3 weeks ago given acute illness Patient is actively working on stopping smoking  Plan: Praised patient on current smoking cessation Emphasized need to continue to not smoke    Return in about 4 weeks (around 11/23/2019), or if symptoms worsen or fail to improve, for Follow up with Dr. Sherene SiresWert.   Coral CeoBrian P Van Ehlert, NP 10/26/2019   This appointment required 42 minutes of patient care (this includes precharting, chart review, review of results, face-to-face care, etc.).

## 2019-11-29 ENCOUNTER — Encounter: Payer: Self-pay | Admitting: Internal Medicine

## 2019-11-29 ENCOUNTER — Other Ambulatory Visit: Payer: Self-pay

## 2019-11-29 ENCOUNTER — Ambulatory Visit: Payer: Medicaid Other | Admitting: Internal Medicine

## 2019-11-29 VITALS — BP 118/80 | HR 120 | Temp 97.8°F | Ht 66.0 in | Wt 151.0 lb

## 2019-11-29 DIAGNOSIS — J45991 Cough variant asthma: Secondary | ICD-10-CM | POA: Diagnosis not present

## 2019-11-29 DIAGNOSIS — F17210 Nicotine dependence, cigarettes, uncomplicated: Secondary | ICD-10-CM

## 2019-11-29 MED ORDER — PREDNISONE 10 MG PO TABS: ORAL_TABLET | ORAL | 0 refills | Status: DC

## 2019-11-29 MED ORDER — PANTOPRAZOLE SODIUM 40 MG PO TBEC: DELAYED_RELEASE_TABLET | ORAL | 2 refills | Status: DC

## 2019-11-29 NOTE — Assessment & Plan Note (Addendum)
The key is to stop smoking completely before smoking completely stops you!         Each maintenance medication was reviewed in detail including emphasizing most importantly the difference between maintenance and prns and under what circumstances the prns are to be triggered using an action plan format where appropriate.  Total time for H and P, chart review, counseling, teaching device and generating customized AVS unique to this office visit / charting = 41 min

## 2019-11-29 NOTE — Progress Notes (Signed)
Subjective:     Patient ID: Selena Haynes, female   DOB: 04/03/1985,     MRN: [id number]     Brief patient profile:  79 yobf   Active smoker   With onset of asthma early 2000's while [place] with variable severity and on and off advair then moved to [place] around summer of 2016 worse and admitted to [facility]  And on d/c ? ? Meds short term then moved to [place] on qvar 80 2 bid plus freq saba up to 4-5 per week while pregnant  so referred to [facility] 08/22/2016 by Dr   Selena Haynes     History of Present Illness  08/22/2016 1st Selena [facility]nes office visit/ Selena Haynes  @ [redacted] weeks gestation  Chief Complaint  Patient presents with  . [facility] Consult    Self referral. Pt states dxed with Asthma at age 34. She c/o increased SOB off and on for the past few months. She has also noticed some wheezing. She is coughing some, but not producing anyu sputum. She is using proair 4 x per wk on average.   variable doe but never at rest unless during coughing fit but not producing much mucus, overall much worse since IUP Less gerd than prev IUP/ only mild nasal congestion/ watery rhinitis Has neb saba but hasn't used in months  rec Stop Qvar (brown inhaler) Plan A = Automatic =  Symbicort 160 Take 2 puffs first thing in am and then another 2 puffs about 12 hours later.  Work on inhaler technique:  Plan B = Backup Only use your albuterol as a rescue medication  Plan C = Crisis - only use your albuterol nebulizer if you first try Plan B and it fails to help > ok to use the nebulizer up to every 4 hours but if start needing it regularly call for immediate appointment   09/24/2016  f/u ov/Selena [facility]nes re:  Asthma dtc during pregnancy  Chief Complaint  Patient presents with  . Follow-up    Breathing has improved some. She is using proair now once per wk on average.   much better breathing and less need for saba  but coughs if slides down under 45 degrees but no need for rescue saba hs and still lots of noct nasal  congestion Just  A few times needed saba hfa/ no neb  Overt hb despite ppi and h2hs but also chewing lots of mint gum  rec No change medications GERD diet  Breathe right strips and nasal saline  Please schedule a follow up visit in 3 months but call sooner if needed> did not return post partum as rec but did fine with delivery and post partum period        01/29/2018  f/u ov/Selena Haynes re: dtc asthma/ no longer nursing  Chief Complaint  Patient presents with  . Follow-up    Breathing is some better but not back to baseline. She states her energy level has been better. She does not know how often she is using her rescue inhaler or her symbicort inhaler. She does not recognize the difference between these inhalers.   prior to the flu was doing fine but still using inhalers a couple times a week with several bad asthma attacks req neb on job then flu around tgiving 2019 and downhill since  Dyspnea:  MMRC1 = can walk nl pace, flat grade, can't hurry or go uphills or steps s sob   Cough: congested rattling day > noct  Sleeping: better now  on 2 pillows though hears herself wheezing at night SABA use: not clear how often but hasn't used neb since 01/27/18 x one  02: none  rec Plan A = Automatic = Symbicort 160 Take 2 puffs first thing in am and then another 2 puffs about 12 hours later.  Work on inhaler technique:  Plan B = Backup Only use your albuterol (Proair)  inhaler as a rescue medication Plan C = Crisis - only use your albuterol nebulizer if you first try Plan B and it fails to help > ok to use the nebulizer up to every 4 hours but if start needing it regularly call for immediate appointment Pantoprazole (protonix) 40 mg   Take  30-60 min before first meal of the day and  Zantac 150 x 2 after supper  until return to office - this is the best way to tell whether stomach acid is contributing to your problem.   Please schedule a follow up office visit in 2 weeks, sooner if needed  with all  medications /inhalers/ solutions in hand so we can verify exactly what you are taking. This includes all medications from all doctors and over the counters - spirometry and feno on return > did not return as rec   NP recs 10/26/19   Flare rx pred,no change maint rx   11/29/2019  f/u ov/Selena Haynes re:  Asthma /  Still smoking / reports more than a year gag /cough /vomit each am  Due to "congestion"  With min actual mucus  Chief Complaint  Patient presents with  . Follow-up    Breathing has improved some since the last visit. She states has trouble with cough in the am to the point of vomiting. She is using her albuterol inhaler 3 x per day on average and neb with albuterol at least once per day.   Dyspnea:   MMRC1 = can walk nl pace, flat grade, can't hurry or go uphills or steps s sob   Cough: worse first thing in am to point even prednisone didn't eliminate  Sleeping: flat bed / one pillow  SABA use: as above  02: none  Missed last BCP injection  late October 2021 but  not sexually active   No obvious day to day or daytime variability or assoc excess/ purulent sputum or mucus plugs or hemoptysis or cp or chest tightness, subjective wheeze or overt sinus or hb symptoms.     Also denies any obvious fluctuation of symptoms with weather or environmental changes or other aggravating or alleviating factors except as outlined above   No unusual exposure hx or h/o childhood pna/ asthma or knowledge of premature birth.  Current Allergies, Complete Past Medical History, Past Surgical History, Family History, and Social History were reviewed in [facility]nes record.  ROS  The following are not active complaints unless bolded Hoarseness, sore throat, dysphagia, dental problems, itching, sneezing,  nasal congestion or discharge of excess mucus or purulent secretions, ear ache,   fever, chills, sweats, unintended wt loss or wt gain, classically pleuritic or exertional cp,  orthopnea pnd  or arm/hand swelling  or leg swelling, presyncope, palpitations, abdominal pain, anorexia, nausea, vomiting, diarrhea  or change in bowel habits or change in bladder habits, change in stools or change in urine, dysuria, hematuria,  rash, arthralgias, visual complaints, headache, numbness, weakness or ataxia or problems with walking or coordination,  change in mood or  memory.        Current Meds  Medication Sig  . acetaminophen (TYLENOL) 325 MG tablet Take 2 tablets (650 mg total) by mouth every 4 (four) hours as needed (for pain scale < 4).  . albuterol (VENTOLIN HFA) 108 (90 Base) MCG/ACT inhaler Inhale 1-2 puffs into the lungs every 6 (six) hours as needed for wheezing or shortness of breath.  . budesonide-formoterol (SYMBICORT) 160-4.5 MCG/ACT inhaler Inhale 2 puffs into the lungs 2 (two) times daily.  . cetirizine (ZYRTEC) 10 MG tablet Take 1 tablet (10 mg total) by mouth daily.  . cyclobenzaprine (FLEXERIL) 10 MG tablet Take 1 tablet (10 mg total) by mouth 2 (two) times daily as needed for muscle spasms.  Marland Kitchen dicyclomine (BENTYL) 20 MG tablet Take 1 tablet (20 mg total) by mouth 4 (four) times daily -  before meals and at bedtime.  . hydrocortisone cream 1 % Apply 1 application topically 2 (two) times daily as needed for itching. otc medication for itching  . hydrOXYzine (VISTARIL) 25 MG capsule Take 25 mg by mouth 3 (three) times daily as needed for itching.   Marland Kitchen ibuprofen (ADVIL) 600 MG tablet Take 1 tablet (600 mg total) by mouth every 6 (six) hours as needed.  Marland Kitchen ipratropium-albuterol (DUONEB) 0.5-2.5 (3) MG/3ML SOLN Take 3 mLs by nebulization every 4 (four) hours as needed.  . ondansetron (ZOFRAN ODT) 4 MG disintegrating tablet Take 1-2 tablets (4-8 mg total) by mouth every 8 (eight) hours as needed for nausea or vomiting.  . [ ]  pantoprazole (PROTONIX) 40 MG tablet Take 1 tablet (40 mg total) by mouth daily. Take 30-60 min before first meal of the day          Objective:   Physical  Exam    amb bf somewhat of a "belle" affect with freq throat clearing, dry sounding cough on voluntary maneuver  11/29/2019        151   01/29/2018          147   09/24/2016         172   08/22/16 165 lb (74.8 kg)  08/19/16 164 lb 14.4 oz (74.8 kg)  08/14/16 163 lb (73.9 kg)       Vital signs reviewed  11/29/2019  - Note at rest 02 sats  100% on RA   HEENT : pt wearing mask not removed for exam due to covid -19 concerns.    NECK :  without JVD/Nodes/TM/ nl carotid upstrokes bilaterally   LUNGS: no acc muscle use,  Nl contour chest which is clear to A and P bilaterally without cough on insp or exp maneuvers   CV:  RRR  no s3 or murmur or increase in P2, and no edema   ABD:  soft and nontender with nl inspiratory excursion in the supine position. No bruits or organomegaly appreciated, bowel sounds nl  MS:  Nl gait/ ext warm without deformities, calf tenderness, cyanosis or clubbing No obvious joint restrictions   SKIN: warm and dry without lesions    NEURO:  alert, approp, nl sensorium with  no motor or cerebellar deficits apparent.          I personally reviewed images and agree with radiology impression as follows:  CXR:   10/26/19  Normal chest radiograph     Assessment:

## 2019-11-29 NOTE — Patient Instructions (Addendum)
Symbicort 160 Take 2 puffs first thing in am and then another 2 puffs about 12 hours later   Work on inhaler technique:  relax and gently blow all the way out then take a nice smooth deep breath back in, triggering the inhaler at same time you start breathing in.  Hold for up to 5 seconds if you can. Blow out thru nose. Rinse and gargle with water when done.  The key is to stop smoking completely before smoking completely stops you!  Take Prednisone 10 mg x  4 for three days 3 for three days 2 for three days 1 for three days and stop   Protonix 40 mg Take 30- 60 min before your first and last meals of the day   GERD (REFLUX)  is an extremely common cause of respiratory symptoms just like yours , many times with no obvious heartburn at all.    It can be treated with medication, but also with lifestyle changes including elevation of the head of your bed (ideally with 6-8inch blocks under the headboard of your bed),  Smoking cessation, avoidance of late meals, excessive alcohol, and avoid fatty foods, chocolate, peppermint, colas, red wine, and acidic juices such as orange juice.  NO MINT OR MENTHOL PRODUCTS SO NO COUGH DROPS  USE SUGARLESS CANDY INSTEAD (Jolley ranchers or Stover's or Life Savers) or even ice chips will also do - the key is to swallow to prevent all throat clearing. NO OIL BASED VITAMINS - use powdered substitutes.  Avoid fish oil when coughing.   I very strongly recommend you get the moderna or pfizer vaccine as soon as possible based on your risk of dying from the virus  and the proven safety and benefit of these vaccines against even the delta variant.  This can save your life as well as  those of your loved ones,  especially if they are also not vaccinated.     Please schedule a follow up office visit in 2 weeks, sooner if needed  with all medications /inhalers/ solutions in hand so we can verify exactly what you are taking. This includes all medications from all doctors and  over the counters

## 2019-11-29 NOTE — Assessment & Plan Note (Signed)
Quit smoking 02/2016 08/22/2016   try symbicort 160 2bid as breaking rule of 2's on qvar 80 2bid > improved 09/24/2016 so rec continue symb 160 2bid thru pregancy and f/u in 3 m for ? Stepdown then - 11/29/2019  After extensive coaching inhaler device,  effectiveness =    90% so continue symb 160 2bid   DDX of  difficult airways management almost all start with A and  include Adherence, Ace Inhibitors, Acid Reflux, Active Sinus Disease, Alpha 1 Antitripsin deficiency, Anxiety masquerading as Airways dz,  ABPA,  Allergy(esp in young), Aspiration (esp in elderly), Adverse effects of meds,  Active smoking or vaping, A bunch of PE's (a small clot burden can't cause this syndrome unless there is already severe underlying pulm or vascular dz with poor reserve) plus two Bs  = Bronchiectasis and Beta blocker use..and one C= CHF   Adherence is always the initial "prime suspect" and is a multilayered concern that requires a "trust but verify" approach in every patient - starting with knowing how to use medications, especially inhalers, correctly, keeping up with refills and understanding the fundamental difference between maintenance and prns vs those medications only taken for a very short course and then stopped and not refilled.  - see hfa teaching - return in 2 weeks with all meds in hand using a trust but verify approach to confirm accurate Medication  Reconciliation The principal here is that until we are certain that the  patients are doing what we've asked, it makes no sense to ask them to do more.    cig smoking to of the rest of the usually suspects > see sep a/p  ? Acid (or non-acid) GERD > always difficult to exclude as up to 75% of pts in some series report no assoc GI/ Heartburn symptoms> rec max (24h)  acid suppression and diet restrictions/ reviewed and instructions given in writing.  - ? If BCP's promoting non acid gerd > try off x 3 m and use alternative means (she says this won't be a problem)   ?  Allergy >  Prednisone Take 4 for three days 3 for three days 2 for three days 1 for three days and stop   ? Anxiety /depression> usually at the bottom of this list of usual suspects but should be much higher on this pt's based on H and P and note already on psychotropics and may interfere with ability to quit smoking and  adherence and also interpretation of response or lack thereof to symptom management which can be quite subjective > defer to PCP

## 2019-12-29 ENCOUNTER — Ambulatory Visit: Payer: Medicaid Other | Admitting: Internal Medicine

## 2020-01-28 ENCOUNTER — Ambulatory Visit: Payer: Medicaid Other | Admitting: Internal Medicine

## 2020-01-28 NOTE — Progress Notes (Deleted)
Subjective:     Patient ID: Selena Haynes, female   DOB: 04/03/1985,     MRN: [id number]     Brief patient profile:  35 yobf   Active smoker   With onset of asthma early 2000's while [place] with variable severity and on and off advair then moved to [place] around summer of 2016 worse and admitted to [facility]  And on d/c ? ? Meds short term then moved to [place] on qvar 80 2 bid plus freq saba up to 4-5 per week while pregnant  so referred to pulmonary clinic 08/22/2016 by Dr   Ronnie Doss     History of Present Illness  08/22/2016 1st [facility] office visit/ Selena Haynes  @ [redacted] weeks gestation  Chief Complaint  Patient presents with  . Pulmonary Consult    Self referral. Pt states dxed with Asthma at age 35. She c/o increased SOB off and on for the past few months. She has also noticed some wheezing. She is coughing some, but not producing anyu sputum. She is using proair 4 x per wk on average.   variable doe but never at rest unless during coughing fit but not producing much mucus, overall much worse since IUP Less gerd than prev IUP/ only mild nasal congestion/ watery rhinitis Has neb saba but hasn't used in months  rec Stop Qvar (brown inhaler) Plan A = Automatic =  Symbicort 160 Take 2 puffs first thing in am and then another 2 puffs about 12 hours later.  Work on inhaler technique:  Plan B = Backup Only use your albuterol as a rescue medication  Plan C = Crisis - only use your albuterol nebulizer if you first try Plan B and it fails to help > ok to use the nebulizer up to every 4 hours but if start needing it regularly call for immediate appointment   09/24/2016  f/u ov/Selena Haynes re:  Asthma dtc during pregnancy  Chief Complaint  Patient presents with  . Follow-up    Breathing has improved some. She is using proair now once per wk on average.   much better breathing and less need for saba  but coughs if slides down under 45 degrees but no need for rescue saba hs and still lots of noct nasal  congestion Just  A few times needed saba hfa/ no neb  Overt hb despite ppi and h2hs but also chewing lots of mint gum  rec No change medications GERD diet  Breathe right strips and nasal saline  Please schedule a follow up visit in 3 months but call sooner if needed> did not return post partum as rec but did fine with delivery and post partum period        01/29/2018  f/u ov/Selena Haynes re: dtc asthma/ no longer nursing  Chief Complaint  Patient presents with  . Follow-up    Breathing is some better but not back to baseline. She states her energy level has been better. She does not know how often she is using her rescue inhaler or her symbicort inhaler. She does not recognize the difference between these inhalers.   prior to the flu was doing fine but still using inhalers a couple times a week with several bad asthma attacks req neb on job then flu around tgiving 2019 and downhill since  Dyspnea:  MMRC1 = can walk nl pace, flat grade, can't hurry or go uphills or steps s sob   Cough: congested rattling day > noct  Sleeping: better now  on 2 pillows though hears herself wheezing at night SABA use: not clear how often but hasn't used neb since 01/27/18 x one  02: none  rec Plan A = Automatic = Symbicort 160 Take 2 puffs first thing in am and then another 2 puffs about 12 hours later.  Work on inhaler technique:  Plan B = Backup Only use your albuterol (Proair)  inhaler as a rescue medication Plan C = Crisis - only use your albuterol nebulizer if you first try Plan B and it fails to help > ok to use the nebulizer up to every 4 hours but if start needing it regularly call for immediate appointment Pantoprazole (protonix) 40 mg   Take  30-60 min before first meal of the day and  Zantac 150 x 2 after supper  until return to office - this is the best way to tell whether stomach acid is contributing to your problem.   Please schedule a follow up office visit in 2 weeks, sooner if needed  with all  medications /inhalers/ solutions in hand so we can verify exactly what you are taking. This includes all medications from all doctors and over the counters - spirometry and feno on return > did not return as rec   NP recs 10/26/19   Flare rx pred,no change maint rx   11/29/2019  f/u ov/Selena Haynes re:  Asthma /  Still smoking / reports more than a year gag /cough /vomit each am  Due to "congestion"  With min actual mucus  Chief Complaint  Patient presents with  . Follow-up    Breathing has improved some since the last visit. She states has trouble with cough in the am to the point of vomiting. She is using her albuterol inhaler 3 x per day on average and neb with albuterol at least once per day.   Dyspnea:   MMRC1 = can walk nl pace, flat grade, can't hurry or go uphills or steps s sob   Cough: worse first thing in am to point even prednisone didn't eliminate  Sleeping: flat bed / one pillow  SABA use: as above  02: none  Missed last BCP injection  late October 2021 but  not sexually active rec Symbicort 160 Take 2 puffs first thing in am and then another 2 puffs about 12 hours later  Work on inhaler technique:   The key is to stop smoking completely before smoking completely stops you! Take Prednisone 10 mg x  4 for three days 3 for three days 2 for three days 1 for three days and stop  Protonix 40 mg Take 30- 60 min before your first and last meals of the day  GERD diet   I very strongly recommend you get the moderna or pfizer vaccine as soon as possible    Please schedule a follow up office visit in 2 weeks, sooner if needed  with all medications /inhalers/ solutions in hand so we can verify exactly what you are taking. This includes all medications from all doctors and over the counters   01/28/2020  f/u ov/Selena Haynes re:  No chief complaint on file.    Dyspnea:  *** Cough: *** Sleeping: *** SABA use: *** 02: ***   No obvious day to day or daytime variability or assoc excess/ purulent  sputum or mucus plugs or hemoptysis or cp or chest tightness, subjective wheeze or overt sinus or hb symptoms.   *** without nocturnal  or early am exacerbation  of respiratory  c/o's or need for noct saba. Also denies any obvious fluctuation of symptoms with weather or environmental changes or other aggravating or alleviating factors except as outlined above   No unusual exposure hx or h/o childhood pna/ asthma or knowledge of premature birth.  Current Allergies, Complete Past Medical History, Past Surgical History, Family History, and Social History were reviewed in [employer] record.  ROS  The following are not active complaints unless bolded Hoarseness, sore throat, dysphagia, dental problems, itching, sneezing,  nasal congestion or discharge of excess mucus or purulent secretions, ear ache,   fever, chills, sweats, unintended wt loss or wt gain, classically pleuritic or exertional cp,  orthopnea pnd or arm/hand swelling  or leg swelling, presyncope, palpitations, abdominal pain, anorexia, nausea, vomiting, diarrhea  or change in bowel habits or change in bladder habits, change in stools or change in urine, dysuria, hematuria,  rash, arthralgias, visual complaints, headache, numbness, weakness or ataxia or problems with walking or coordination,  change in mood or  memory.        No outpatient medications have been marked as taking for the 01/28/20 encounter (Appointment) with Nyoka Cowden, MD.               Objective:   Physical Exam      01/28/2020           *** 11/29/2019        151   01/29/2018          147   09/24/2016         172   08/22/16 165 lb (74.8 kg)  08/19/16 164 lb 14.4 oz (74.8 kg)  08/14/16 163 lb (73.9 kg)        Vital signs reviewed  01/28/2020  - Note at rest 02 sats  ***% on ***   General appearance:    ***             Assessment:

## 2020-09-30 ENCOUNTER — Emergency Department (HOSPITAL_COMMUNITY)
Admission: EM | Admit: 2020-09-30 | Discharge: 2020-09-30 | Disposition: A | Discharge status: Home / Self Care | Payer: Medicaid Other | Attending: Emergency Medicine | Admitting: Emergency Medicine

## 2020-09-30 ENCOUNTER — Other Ambulatory Visit: Payer: Self-pay

## 2020-09-30 ENCOUNTER — Emergency Department (HOSPITAL_COMMUNITY): Payer: Medicaid Other

## 2020-09-30 ENCOUNTER — Encounter (HOSPITAL_COMMUNITY): Payer: Self-pay | Admitting: Emergency Medicine

## 2020-09-30 DIAGNOSIS — R112 Nausea with vomiting, unspecified: Secondary | ICD-10-CM | POA: Diagnosis not present

## 2020-09-30 DIAGNOSIS — J45909 Unspecified asthma, uncomplicated: Secondary | ICD-10-CM | POA: Insufficient documentation

## 2020-09-30 DIAGNOSIS — R791 Abnormal coagulation profile: Secondary | ICD-10-CM | POA: Diagnosis not present

## 2020-09-30 DIAGNOSIS — R202 Paresthesia of skin: Secondary | ICD-10-CM | POA: Diagnosis not present

## 2020-09-30 DIAGNOSIS — H538 Other visual disturbances: Secondary | ICD-10-CM | POA: Insufficient documentation

## 2020-09-30 DIAGNOSIS — I10 Essential (primary) hypertension: Secondary | ICD-10-CM | POA: Diagnosis not present

## 2020-09-30 DIAGNOSIS — R29818 Other symptoms and signs involving the nervous system: Secondary | ICD-10-CM | POA: Diagnosis not present

## 2020-09-30 DIAGNOSIS — F17210 Nicotine dependence, cigarettes, uncomplicated: Secondary | ICD-10-CM | POA: Diagnosis not present

## 2020-09-30 DIAGNOSIS — R519 Headache, unspecified: Secondary | ICD-10-CM | POA: Insufficient documentation

## 2020-09-30 DIAGNOSIS — M542 Cervicalgia: Secondary | ICD-10-CM | POA: Diagnosis not present

## 2020-09-30 DIAGNOSIS — R531 Weakness: Secondary | ICD-10-CM | POA: Insufficient documentation

## 2020-09-30 DIAGNOSIS — R2990 Unspecified symptoms and signs involving the nervous system: Secondary | ICD-10-CM

## 2020-09-30 LAB — COMPREHENSIVE METABOLIC PANEL
ALT: 11 U/L (ref 0–44)
AST: 15 U/L (ref 15–41)
Albumin: 3.9 g/dL (ref 3.5–5.0)
Alkaline Phosphatase: 44 U/L (ref 38–126)
Anion gap: 10 (ref 5–15)
BUN: 11 mg/dL (ref 6–20)
CO2: 25 mmol/L (ref 22–32)
Calcium: 9.4 mg/dL (ref 8.9–10.3)
Chloride: 104 mmol/L (ref 98–111)
Creatinine, Ser: 0.8 mg/dL (ref 0.44–1.00)
GFR, Estimated: >60 mL/min (ref >60)
Glucose, Bld: 82 mg/dL (ref 70–99)
Potassium: 3.8 mmol/L (ref 3.5–5.1)
Sodium: 139 mmol/L (ref 135–145)
Total Bilirubin: 0.5 mg/dL (ref 0.3–1.2)
Total Protein: 6.7 g/dL (ref 6.5–8.1)

## 2020-09-30 LAB — CBC
HCT: 43.3 % (ref 36.0–46.0)
Hemoglobin: 14.3 g/dL (ref 12.0–15.0)
MCH: 30.6 pg (ref 26.0–34.0)
MCHC: 33 g/dL (ref 30.0–36.0)
MCV: 92.7 fL (ref 80.0–100.0)
Platelets: 328 10*3/uL (ref 150–400)
RBC: 4.67 MIL/uL (ref 3.87–5.11)
RDW: 11.9 % (ref 11.5–15.5)
WBC: 9.9 10*3/uL (ref 4.0–10.5)
nRBC: 0 % (ref 0.0–0.2)

## 2020-09-30 LAB — DIFFERENTIAL
Abs Immature Granulocytes: 0.03 10*3/uL (ref 0.00–0.07)
Basophils Absolute: 0.1 10*3/uL (ref 0.0–0.1)
Basophils Relative: 1 %
Eosinophils Absolute: 0.3 10*3/uL (ref 0.0–0.5)
Eosinophils Relative: 3 %
Immature Granulocytes: 0 %
Lymphocytes Relative: 24 %
Lymphs Abs: 2.4 10*3/uL (ref 0.7–4.0)
Monocytes Absolute: 0.7 10*3/uL (ref 0.1–1.0)
Monocytes Relative: 7 %
Neutro Abs: 6.4 10*3/uL (ref 1.7–7.7)
Neutrophils Relative %: 65 %

## 2020-09-30 LAB — PROTIME-INR
INR: 1 (ref 0.8–1.2)
Prothrombin Time: 13.5 seconds (ref 11.4–15.2)

## 2020-09-30 LAB — APTT: aPTT: 27 seconds (ref 24–36)

## 2020-09-30 MED ORDER — ONDANSETRON HCL 4 MG/2ML IJ SOLN
4.0000 mg | Freq: Once | INTRAMUSCULAR | Status: AC
  Administered 2020-09-30: 4 mg via INTRAVENOUS
  Filled 2020-09-30: qty 2

## 2020-09-30 MED ORDER — FENTANYL CITRATE PF 50 MCG/ML IJ SOSY
50.0000 ug | PREFILLED_SYRINGE | Freq: Once | INTRAMUSCULAR | Status: AC
  Administered 2020-09-30: 50 ug via INTRAVENOUS
  Filled 2020-09-30: qty 1

## 2020-09-30 MED ORDER — KETOROLAC TROMETHAMINE 30 MG/ML IJ SOLN
30.0000 mg | Freq: Once | INTRAMUSCULAR | Status: AC
  Administered 2020-09-30: 30 mg via INTRAVENOUS
  Filled 2020-09-30: qty 1

## 2020-09-30 MED ORDER — GADOBUTROL 1 MMOL/ML IV SOLN
6.8000 mL | Freq: Once | INTRAVENOUS | Status: AC | PRN
  Administered 2020-09-30: 6.8 mL via INTRAVENOUS

## 2020-09-30 NOTE — ED Provider Notes (Signed)
Emergency Department Provider Note   I have reviewed the triage vital signs and the nursing notes.   HISTORY  Chief Complaint Headache   HPI Selena Haynes is a 35 y.o. female with past medical history reviewed below presents to the emergency department with intermittent headaches with tingling and some subjective weakness on the right.  Patient describes symptoms intermittently over the past several months.  She feels pain mainly in the right forehead area with some occasional blurry vision.  She denies photophobia.  She is not having fevers or chills.  She will sometimes have pain down her neck along with some nausea and vomiting.  She feels subjectively weak on the right with some tingling/numbness at times.  She notes that once the headache resolves her symptoms also resolved.  She notes a remote history of migraine headaches but states that was when she was a child and she has not had issues as an adult.  She denies any jaw claudication or tenderness to palpation of her temples. No visual field deficits appreciated by patient.    Past Medical History:  Diagnosis Date   Anxiety    Asthma    Back pain    Carpal tunnel syndrome    Hypertension    Post traumatic stress disorder (PTSD)    Postpartum depression     Patient Active Problem List   Diagnosis Date Noted   Allergic rhinitis 10/26/2019   Cigarette smoker 10/26/2019   Healthcare maintenance 10/26/2019   Cough variant asthma 01/29/2018   Bronchitis with asthma, acute 01/09/2018   Thrush, oral 01/09/2018   Rhinitis, nonallergic, chronic 09/24/2016   GERD without esophagitis 09/24/2016   Carpal tunnel syndrome, right upper limb 05/22/2016   Anxiety disorder affecting pregnancy, antepartum 05/22/2016   Previous cesarean delivery affecting pregnancy, antepartum 02/26/2016    Past Surgical History:  Procedure Laterality Date   CESAREAN SECTION      Allergies Patient has no known allergies.  Family History   Problem Relation Age of Onset   Asthma Sister    Asthma Brother    Hypertension Maternal Grandmother    Diabetes Maternal Grandmother    Diabetes Mother    Hypertension Maternal Aunt    Diabetes Maternal Uncle     Social History Social History   Tobacco Use   Smoking status: Some Days    Packs/day: 0.50    Years: 21.00    Pack years: 10.50    Types: Cigarettes    Start date: 01/21/1998   Smokeless tobacco: Former  Substance Use Topics   Alcohol use: No   Drug use: Yes    Types: Marijuana    Comment: last used Marijuana in February 2018    Review of Systems  Constitutional: No fever/chills Eyes: No visual changes. ENT: No sore throat. Cardiovascular: Denies chest pain. Respiratory: Denies shortness of breath. Gastrointestinal: No abdominal pain.  No nausea, no vomiting.  No diarrhea.  No constipation. Genitourinary: Negative for dysuria. Musculoskeletal: Negative for back pain. Skin: Negative for rash. Neurological: Positive HA with intermittent tingling/numbness.   10-point ROS otherwise negative.  ____________________________________________   PHYSICAL EXAM:  VITAL SIGNS: ED Triage Vitals  Enc Vitals Group     BP 09/30/20 1000 135/70     Pulse Rate 09/30/20 1000 (!) 108     Resp 09/30/20 1000 18     Temp 09/30/20 1000 98.8 F (37.1 C)     Temp Source 09/30/20 1000 Oral     SpO2 09/30/20 1000 100 %  Constitutional: Alert and oriented. Well appearing and in no acute distress. Eyes: Conjunctivae are normal. PERRL. EOMI. Head: Atraumatic. Nose: No congestion/rhinnorhea. Mouth/Throat: Mucous membranes are moist.   Neck: No stridor.  Cardiovascular: Normal rate, regular rhythm. Good peripheral circulation. Grossly normal heart sounds.   Respiratory: Normal respiratory effort.  No retractions. Lungs CTAB. Gastrointestinal: Soft and nontender. No distention.  Musculoskeletal: No lower extremity tenderness nor edema. No gross deformities of  extremities. Neurologic:  Normal speech and language. Subjective decreased sensation in the right arm/leg. No weakness.  Skin:  Skin is warm, dry and intact. No rash noted.  ____________________________________________   LABS (all labs ordered are listed, but only abnormal results are displayed)  Labs Reviewed  PROTIME-INR  APTT  CBC  DIFFERENTIAL  COMPREHENSIVE METABOLIC PANEL  I-STAT BETA HCG BLOOD, ED ([facility], [facility], [facility] ONLY)   ____________________________________________  EKG   EKG Interpretation  Date/Time:  Saturday September 30 2020 15:36:10 EDT Ventricular Rate:  64 PR Interval:  49 QRS Duration: 79 QT Interval:  425 QTC Calculation: 439 R Axis:   82 Text Interpretation: Sinus rhythm Short PR interval Anteroseptal infarct, age indeterminate Confirmed by Alona Bene [phone]) on 09/30/2020 8:14:23 PM          ____________________________________________  RADIOLOGY  CT HEAD WO CONTRAST  Result Date: 09/30/2020 CLINICAL DATA:  Acute neural deficit.  Assess for stroke. EXAM: CT HEAD WITHOUT CONTRAST TECHNIQUE: Contiguous axial images were obtained from the base of the skull through the vertex without intravenous contrast. COMPARISON:  CT orbit April 22, 2016 FINDINGS: Brain: There is mild hypoechogenicity the in the right temporal lobe image 8 series 4. There is no midline shift, hydrocephalus or acute hemorrhage. No extra axial collection is identified. Vascular: There is question hyperdense right intracranial vessel above the sella turcica. Skull: Normal. Negative for fracture or focal lesion. Sinuses/Orbits: Mild mucoperiosteal thickening of the left ethmoid sinus is identified. Other: None. IMPRESSION: 1. Mild hypoechogenicity in the right temporal lobe, acute infarct is not excluded. MRI of the brain is recommended. 2. Question hyperdense right intracranial vessel above the sella turcica. Further evaluation with MRI of the brain is recommended. Electronically Signed   By:  Sherian Rein M.D.   On: 09/30/2020 11:26   MR ANGIO HEAD WO CONTRAST  Result Date: 09/30/2020 CLINICAL DATA:  Initial evaluation for neuro deficit, stroke suspected. EXAM: MRI HEAD WITHOUT CONTRAST MRA HEAD WITHOUT CONTRAST MRA NECK WITHOUT AND WITH CONTRAST TECHNIQUE: Multiplanar, multi-echo pulse sequences of the brain and surrounding structures were acquired without intravenous contrast. Angiographic images of the Circle of Willis were acquired using MRA technique without intravenous contrast. Angiographic images of the neck were acquired using MRA technique without and with intravenous contrast. Carotid stenosis measurements (when applicable) are obtained utilizing NASCET criteria, using the distal internal carotid diameter as the denominator. CONTRAST:  6.79mL GADAVIST GADOBUTROL 1 MMOL/ML IV SOLN COMPARISON:  CT from earlier the same day. FINDINGS: MRI HEAD FINDINGS Brain: Cerebral volume within normal limits for patient age. No focal parenchymal signal abnormality identified. No abnormal foci of restricted diffusion to suggest acute or subacute ischemia. Gray-white matter differentiation well maintained. No encephalomalacia to suggest chronic infarction. No foci of susceptibility artifact to suggest acute or chronic intracranial hemorrhage. No mass lesion, midline shift or mass effect. Slight asymmetry of the lateral ventricles without hydrocephalus noted. No extra-axial fluid collection. Pituitary gland and suprasellar region are normal. Midline structures intact and normal. Vascular: Major intracranial vascular flow voids well maintained and normal in  appearance. Skull and upper cervical spine: Craniocervical junction normal. Mild degenerative disc bulging noted at C3-4 without significant stenosis. Bone marrow signal intensity within normal limits. No scalp soft tissue abnormality. Sinuses/Orbits: Globes and orbital soft tissues within normal limits. Scattered mucosal thickening noted within the  ethmoidal air cells. Paranasal sinuses are otherwise clear. No mastoid effusion. Inner ear structures grossly normal. Other: None. MRA HEAD FINDINGS Anterior circulation: Both internal carotid arteries widely patent to the termini without stenosis. A1 segments widely patent. Normal anterior communicating artery complex. Both anterior cerebral arteries widely patent to their distal aspects without stenosis. No M1 stenosis or occlusion. Normal MCA bifurcations. Distal MCA branches well perfused and symmetric. Posterior circulation: Both vertebral arteries patent to the vertebrobasilar junction without stenosis. Left vertebral artery dominant. Both PICA origins patent and normal. Basilar widely patent to its distal aspect without stenosis. Superior cerebral arteries patent bilaterally. Both PCAs supplied via hypoplastic P1 segments and robust bilateral posterior communicating arteries. PCAs well perfused or distal aspects without stenosis. Anatomic variants: None significant.  No aneurysm. MRA NECK FINDINGS Aortic arch: Visualized aortic arch normal caliber with normal 3 vessel morphology. No stenosis or other abnormality about the origin of the great vessels. Right carotid system: Right common and internal carotid arteries widely patent without stenosis, evidence for dissection or occlusion. Left carotid system: Left common and internal carotid arteries widely patent without stenosis, evidence for dissection or occlusion. Vertebral arteries: Both vertebral arteries arise from the subclavian arteries. Left vertebral artery dominant. Vertebral arteries patent without stenosis, evidence for dissection or occlusion. Other: None IMPRESSION: 1. Normal brain MRI. No acute intracranial abnormality identified. 2. Normal intracranial MRA. 3. Normal MRA of the neck. Electronically Signed   By: Rise Mu M.D.   On: 09/30/2020 19:41   MR Angiogram Neck W or Wo Contrast  Result Date: 09/30/2020 CLINICAL DATA:   Initial evaluation for neuro deficit, stroke suspected. EXAM: MRI HEAD WITHOUT CONTRAST MRA HEAD WITHOUT CONTRAST MRA NECK WITHOUT AND WITH CONTRAST TECHNIQUE: Multiplanar, multi-echo pulse sequences of the brain and surrounding structures were acquired without intravenous contrast. Angiographic images of the Circle of Willis were acquired using MRA technique without intravenous contrast. Angiographic images of the neck were acquired using MRA technique without and with intravenous contrast. Carotid stenosis measurements (when applicable) are obtained utilizing NASCET criteria, using the distal internal carotid diameter as the denominator. CONTRAST:  6.64mL GADAVIST GADOBUTROL 1 MMOL/ML IV SOLN COMPARISON:  CT from earlier the same day. FINDINGS: MRI HEAD FINDINGS Brain: Cerebral volume within normal limits for patient age. No focal parenchymal signal abnormality identified. No abnormal foci of restricted diffusion to suggest acute or subacute ischemia. Gray-white matter differentiation well maintained. No encephalomalacia to suggest chronic infarction. No foci of susceptibility artifact to suggest acute or chronic intracranial hemorrhage. No mass lesion, midline shift or mass effect. Slight asymmetry of the lateral ventricles without hydrocephalus noted. No extra-axial fluid collection. Pituitary gland and suprasellar region are normal. Midline structures intact and normal. Vascular: Major intracranial vascular flow voids well maintained and normal in appearance. Skull and upper cervical spine: Craniocervical junction normal. Mild degenerative disc bulging noted at C3-4 without significant stenosis. Bone marrow signal intensity within normal limits. No scalp soft tissue abnormality. Sinuses/Orbits: Globes and orbital soft tissues within normal limits. Scattered mucosal thickening noted within the ethmoidal air cells. Paranasal sinuses are otherwise clear. No mastoid effusion. Inner ear structures grossly normal.  Other: None. MRA HEAD FINDINGS Anterior circulation: Both internal carotid arteries widely patent to  the termini without stenosis. A1 segments widely patent. Normal anterior communicating artery complex. Both anterior cerebral arteries widely patent to their distal aspects without stenosis. No M1 stenosis or occlusion. Normal MCA bifurcations. Distal MCA branches well perfused and symmetric. Posterior circulation: Both vertebral arteries patent to the vertebrobasilar junction without stenosis. Left vertebral artery dominant. Both PICA origins patent and normal. Basilar widely patent to its distal aspect without stenosis. Superior cerebral arteries patent bilaterally. Both PCAs supplied via hypoplastic P1 segments and robust bilateral posterior communicating arteries. PCAs well perfused or distal aspects without stenosis. Anatomic variants: None significant.  No aneurysm. MRA NECK FINDINGS Aortic arch: Visualized aortic arch normal caliber with normal 3 vessel morphology. No stenosis or other abnormality about the origin of the great vessels. Right carotid system: Right common and internal carotid arteries widely patent without stenosis, evidence for dissection or occlusion. Left carotid system: Left common and internal carotid arteries widely patent without stenosis, evidence for dissection or occlusion. Vertebral arteries: Both vertebral arteries arise from the subclavian arteries. Left vertebral artery dominant. Vertebral arteries patent without stenosis, evidence for dissection or occlusion. Other: None IMPRESSION: 1. Normal brain MRI. No acute intracranial abnormality identified. 2. Normal intracranial MRA. 3. Normal MRA of the neck. Electronically Signed   By: Rise MuBenjamin  McClintock M.D.   On: 09/30/2020 19:41   MR BRAIN WO CONTRAST  Result Date: 09/30/2020 CLINICAL DATA:  Initial evaluation for neuro deficit, stroke suspected. EXAM: MRI HEAD WITHOUT CONTRAST MRA HEAD WITHOUT CONTRAST MRA NECK WITHOUT AND  WITH CONTRAST TECHNIQUE: Multiplanar, multi-echo pulse sequences of the brain and surrounding structures were acquired without intravenous contrast. Angiographic images of the Circle of Willis were acquired using MRA technique without intravenous contrast. Angiographic images of the neck were acquired using MRA technique without and with intravenous contrast. Carotid stenosis measurements (when applicable) are obtained utilizing NASCET criteria, using the distal internal carotid diameter as the denominator. CONTRAST:  6.128mL GADAVIST GADOBUTROL 1 MMOL/ML IV SOLN COMPARISON:  CT from earlier the same day. FINDINGS: MRI HEAD FINDINGS Brain: Cerebral volume within normal limits for patient age. No focal parenchymal signal abnormality identified. No abnormal foci of restricted diffusion to suggest acute or subacute ischemia. Gray-white matter differentiation well maintained. No encephalomalacia to suggest chronic infarction. No foci of susceptibility artifact to suggest acute or chronic intracranial hemorrhage. No mass lesion, midline shift or mass effect. Slight asymmetry of the lateral ventricles without hydrocephalus noted. No extra-axial fluid collection. Pituitary gland and suprasellar region are normal. Midline structures intact and normal. Vascular: Major intracranial vascular flow voids well maintained and normal in appearance. Skull and upper cervical spine: Craniocervical junction normal. Mild degenerative disc bulging noted at C3-4 without significant stenosis. Bone marrow signal intensity within normal limits. No scalp soft tissue abnormality. Sinuses/Orbits: Globes and orbital soft tissues within normal limits. Scattered mucosal thickening noted within the ethmoidal air cells. Paranasal sinuses are otherwise clear. No mastoid effusion. Inner ear structures grossly normal. Other: None. MRA HEAD FINDINGS Anterior circulation: Both internal carotid arteries widely patent to the termini without stenosis. A1  segments widely patent. Normal anterior communicating artery complex. Both anterior cerebral arteries widely patent to their distal aspects without stenosis. No M1 stenosis or occlusion. Normal MCA bifurcations. Distal MCA branches well perfused and symmetric. Posterior circulation: Both vertebral arteries patent to the vertebrobasilar junction without stenosis. Left vertebral artery dominant. Both PICA origins patent and normal. Basilar widely patent to its distal aspect without stenosis. Superior cerebral arteries patent bilaterally. Both PCAs supplied via  hypoplastic P1 segments and robust bilateral posterior communicating arteries. PCAs well perfused or distal aspects without stenosis. Anatomic variants: None significant.  No aneurysm. MRA NECK FINDINGS Aortic arch: Visualized aortic arch normal caliber with normal 3 vessel morphology. No stenosis or other abnormality about the origin of the great vessels. Right carotid system: Right common and internal carotid arteries widely patent without stenosis, evidence for dissection or occlusion. Left carotid system: Left common and internal carotid arteries widely patent without stenosis, evidence for dissection or occlusion. Vertebral arteries: Both vertebral arteries arise from the subclavian arteries. Left vertebral artery dominant. Vertebral arteries patent without stenosis, evidence for dissection or occlusion. Other: None IMPRESSION: 1. Normal brain MRI. No acute intracranial abnormality identified. 2. Normal intracranial MRA. 3. Normal MRA of the neck. Electronically Signed   By: Rise Mu M.D.   On: 09/30/2020 19:41    ____________________________________________   PROCEDURES  Procedure(s) performed:   Procedures  None  ____________________________________________   INITIAL IMPRESSION / ASSESSMENT AND PLAN / ED COURSE  Pertinent labs & imaging results that were available during my care of the patient were reviewed by me and  considered in my medical decision making (see chart for details).   Patient presents to the emergency department with intermittent headache with some neuro symptoms.  Differential includes stroke but lower likelihood given patient's age and risk factors.  MS is also a consideration although does seem to mainly start with headache followed by neuro symptoms and then resolved and headache resolved.  Complex migraine also a consideration.  History and exam are not consistent with subarachnoid hemorrhage or infectious process.   CT imaging during the MSE process showed concern for possible hypoechogenicity on the right.  Some abnormality also seen on the right near the sella turcica question of vascular etiology.  This study was followed up with MRI/MRA of the head and neck which showed no acute abnormalities either the vasculature or of the brain.   Plan for Neuro referral as outpatient and HA cocktail.    ____________________________________________  FINAL CLINICAL IMPRESSION(S) / ED DIAGNOSES  Final diagnoses:  Acute nonintractable headache, unspecified headache type  Stroke-like symptoms    MEDICATIONS GIVEN DURING THIS VISIT:  Medications  ondansetron (ZOFRAN) injection 4 mg (4 mg Intravenous Given 09/30/20 1536)  fentaNYL (SUBLIMAZE) injection 50 mcg (50 mcg Intravenous Given 09/30/20 1535)  gadobutrol (GADAVIST) 1 MMOL/ML injection 6.8 mL (6.8 mLs Intravenous Contrast Given 09/30/20 1722)  ketorolac (TORADOL) 30 MG/ML injection 30 mg (30 mg Intravenous Given 09/30/20 2004)    Note:  This document was prepared using Dragon voice recognition software and may include unintentional dictation errors.  Alona Bene, MD, [facility]    Lexii Walsh, Arlyss Repress, MD 09/30/20 2018

## 2020-09-30 NOTE — ED Triage Notes (Signed)
Pt reports intermittent R sided headaches for a few months.  Reports headache since last night with cervical spine pain.  No known injury.  Vomited x 2 this morning.  No numbness or weakness noted on exam.

## 2020-09-30 NOTE — ED Notes (Signed)
Follow up care reviewed and explained, pt verbalized understanding. 

## 2020-09-30 NOTE — ED Provider Notes (Signed)
Emergency Medicine Provider Triage Evaluation Note  Selena Haynes , a 35 y.o. female  was evaluated in triage.  Pt complains of right-sided headache with right-sided blurry vision, facial paresthesias on the right, and right arm paresthesias and weakness.  Headaches happen intermittently for the last 2 months, 1-2 times per week.  Patient with last known well 7 PM.  Nausea and vomiting's morning with headache.  Review of Systems  Positive: Headache, blurry vision, paresthesias on the right side of the face and the right upper extremity.  Nausea and vomiting Negative: Fevers, chills, trauma Physical Exam  BP 135/70 (BP Location: Right Arm)   Pulse (!) 108   Temp 98.8 F (37.1 C) (Oral)   Resp 18   LMP 09/29/2020   SpO2 100%  Gen:   Awake, no distress   Resp:  Normal effort  MSK:   Moves extremities without difficulty  Other:  Decree sensation on the right half of the face relative to the left, right upper extremity weakness compared to the left.  EOMI, PERRL, visual acuity normal at this time.  Medical Decision Making  Medically screening exam initiated at 10:44 AM.  Appropriate orders placed.  Deshana Rominger was informed that the remainder of the evaluation will be completed by another provider, this initial triage assessment does not replace that evaluation, and the importance of remaining in the ED until their evaluation is complete.  We will proceed with stroke work-up without activating code stroke as last known well was 7 PM last night, and patient is denying blurry vision at this time.  This chart was dictated using voice recognition software, Dragon. Despite the best efforts of this provider to proofread and correct errors, errors may still occur which can change documentation meaning.    Paris Lore, PA-C 09/30/20 1052    Maia Plan, MD 10/08/20 1756

## 2020-09-30 NOTE — Discharge Instructions (Signed)
You were seen in the emergency room today with headache and numbness/weakness symptoms.  Your MRIs did not show signs of stroke.  Please follow-up with the neurologist listed.  I have placed a referral in our system to help with this.  Please return to the emergency department any new or suddenly worsening symptoms.

## 2020-10-05 ENCOUNTER — Telehealth: Payer: Self-pay | Admitting: Neurology

## 2020-10-05 ENCOUNTER — Ambulatory Visit: Payer: Medicaid Other | Admitting: Neurology

## 2020-10-05 ENCOUNTER — Encounter: Payer: Self-pay | Admitting: Neurology

## 2020-10-05 VITALS — BP 123/72 | HR 94 | Ht 66.0 in | Wt 143.0 lb

## 2020-10-05 DIAGNOSIS — R519 Headache, unspecified: Secondary | ICD-10-CM | POA: Insufficient documentation

## 2020-10-05 DIAGNOSIS — G44029 Chronic cluster headache, not intractable: Secondary | ICD-10-CM | POA: Insufficient documentation

## 2020-10-05 DIAGNOSIS — M5481 Occipital neuralgia: Secondary | ICD-10-CM | POA: Diagnosis not present

## 2020-10-05 DIAGNOSIS — M542 Cervicalgia: Secondary | ICD-10-CM | POA: Diagnosis not present

## 2020-10-05 DIAGNOSIS — G441 Vascular headache, not elsewhere classified: Secondary | ICD-10-CM | POA: Diagnosis not present

## 2020-10-05 MED ORDER — VERAPAMIL HCL ER 180 MG PO TBCR: 180.0000 mg | EXTENDED_RELEASE_TABLET | Freq: Every day | ORAL | 2 refills | Status: DC

## 2020-10-05 MED ORDER — SUMATRIPTAN SUCCINATE 6 MG/0.5ML ~~LOC~~ SOLN: 6.0000 mg | SUBCUTANEOUS | 2 refills | Status: DC | PRN

## 2020-10-05 NOTE — Telephone Encounter (Signed)
Pt needs f/u in 4 weeks per Dr. Epimenio Foot. There are no OV slots close to this timeframe. Please advise.

## 2020-10-05 NOTE — Progress Notes (Signed)
[facility]  PATIENT: Selena Haynes DOB: [birth year]  REFERRING DOCTOR OR PCP: Alona Bene, MD; Vergia Alberts, MD SOURCE: Patient, Notes from ED  _________________________________   HISTORICAL  CHIEF COMPLAINT:  Chief Complaint  Patient presents with   New Patient (Initial Visit)    Rm 1, alone. Internal referral form ED for HA. Seen on 9/10. Pt has had 2 more HA since last visit. Has taken ibup. 400mg  up to 800mg . Has gone to bed with a HA and has woken up disoriented and nausea.     HISTORY OF PRESENT ILLNESS:  I had the pleasure of seeing your patient, Selena Haynes, at [facility] for neurologic consultation regarding her headaches.  She is a 35 year old woman who presented to the emergency room on 09/30/2020 with a several -month history of pain predominantly in the right forehead associated with blurry vision.   She experiences pain in the right forehead and also notes the right face and right arm seems weaker.   The intense headache is lasting a couple hours but she has milder pain before the intensification and often milder pain afterwards.     She get nausea and vomiting.   She has waken up with N/V with a headache some times, as well.    These headaches are occurring a couple times a day - about 1/2 or more at night.     She has photophobia and phonophobia.   She also notes pain in the upper cervical spine when  the headaches are intense.       She has taken Ibuprofen without benefit.  Fresh air has not helped.     She has had occasional milder episodic migraines x many years.   These were throbbing with N/V and photophobia   Taking an NSAID and laying down would help.   Sleep would end the migraine.    MRI of the brain 09/30/2020 was normal.  MR angiogram of the neck and head 09/30/2020 was normal.  REVIEW OF SYSTEMS: Constitutional: No fevers, chills, sweats, or change in appetite Eyes: No visual changes, double vision, eye  pain Ear, nose and throat: No hearing loss, ear pain, nasal congestion, sore throat Cardiovascular: No chest pain, palpitations Respiratory:  No shortness of breath at rest or with exertion.   No wheezes GastrointestinaI: No nausea, vomiting, diarrhea, abdominal pain, fecal incontinence Genitourinary:  No dysuria, urinary retention or frequency.  No nocturia. Musculoskeletal:  No neck pain, back pain Integumentary: No rash, pruritus, skin lesions Neurological: as above Psychiatric: No depression at this time.  No anxiety Endocrine: No palpitations, diaphoresis, change in appetite, change in weigh or increased thirst Hematologic/Lymphatic:  No anemia, purpura, petechiae. Allergic/Immunologic: No itchy/runny eyes, nasal congestion, recent allergic reactions, rashes  ALLERGIES: No Known Allergies  HOME MEDICATIONS:  Current Outpatient Medications:    acetaminophen (TYLENOL) 325 MG tablet, Take 2 tablets (650 mg total) by mouth every 4 (four) hours as needed (for pain scale < 4)., Disp: 30 tablet, Rfl: 0   albuterol (VENTOLIN HFA) 108 (90 Base) MCG/ACT inhaler, Inhale 1-2 puffs into the lungs every 6 (six) hours as needed for wheezing or shortness of breath., Disp: 18 g, Rfl: 0   budesonide-formoterol (SYMBICORT) 160-4.5 MCG/ACT inhaler, Inhale 2 puffs into the lungs 2 (two) times daily., Disp: 10.2 g, Rfl: 0   cetirizine (ZYRTEC) 10 MG tablet, Take 1 tablet (10 mg total) by mouth daily., Disp: 30 tablet, Rfl: 12   cyclobenzaprine (FLEXERIL) 10 MG tablet, Take 1  tablet (10 mg total) by mouth 2 (two) times daily as needed for muscle spasms., Disp: 20 tablet, Rfl: 0   dicyclomine (BENTYL) 20 MG tablet, Take 1 tablet (20 mg total) by mouth 4 (four) times daily -  before meals and at bedtime., Disp: 20 tablet, Rfl: 0   hydrocortisone cream 1 %, Apply 1 application topically 2 (two) times daily as needed for itching. otc medication for itching, Disp: , Rfl:    hydrOXYzine (VISTARIL) 25 MG capsule,  Take 25 mg by mouth 3 (three) times daily as needed for itching. , Disp: , Rfl:    ibuprofen (ADVIL) 600 MG tablet, Take 1 tablet (600 mg total) by mouth every 6 (six) hours as needed., Disp: 30 tablet, Rfl: 0   ipratropium-albuterol (DUONEB) 0.5-2.5 (3) MG/3ML SOLN, Take 3 mLs by nebulization every 4 (four) hours as needed., Disp: 360 mL, Rfl: 0   ondansetron (ZOFRAN ODT) 4 MG disintegrating tablet, Take 1-2 tablets (4-8 mg total) by mouth every 8 (eight) hours as needed for nausea or vomiting., Disp: 20 tablet, Rfl: 0   pantoprazole (PROTONIX) 40 MG tablet, Take 30- 60 min before your first and last meals of the day, Disp: 60 tablet, Rfl: 2   predniSONE (DELTASONE) 10 MG tablet, Take 4 for three days 3 for three days 2 for three days 1 for three days and stop, Disp: 30 tablet, Rfl: 0   SUMAtriptan (IMITREX) 6 MG/0.5ML SOLN injection, Inject 0.5 mLs (6 mg total) into the skin every 2 (two) hours as needed for migraine or headache. May repeat in 2 hours if headache persists or recurs.    No more than 4/week, Disp: 6 mL, Rfl: 2   verapamil (CALAN-SR) 180 MG CR tablet, Take 1 tablet (180 mg total) by mouth at bedtime., Disp: 30 tablet, Rfl: 2   fluticasone (FLONASE) 50 MCG/ACT nasal spray, Place 1-2 sprays into both nostrils daily for 7 days., Disp: 1 g, Rfl: 0  PAST MEDICAL HISTORY: Past Medical History:  Diagnosis Date   Anxiety    Asthma    Back pain    Carpal tunnel syndrome    Hypertension    Post traumatic stress disorder (PTSD)    Postpartum depression     PAST SURGICAL HISTORY: Past Surgical History:  Procedure Laterality Date   CESAREAN SECTION      FAMILY HISTORY: Family History  Problem Relation Age of Onset   Diabetes Mother    Asthma Sister    Asthma Brother    Hypertension Maternal Aunt    Diabetes Maternal Uncle    Hypertension Maternal Grandmother    Diabetes Maternal Grandmother     SOCIAL HISTORY:  Social History   Socioeconomic History   Marital status:  Single    Spouse name: Not on file   Number of children: 3   Years of education: Not on file   Highest education level: High school graduate  Occupational History   Not on file  Tobacco Use   Smoking status: Some Days    Packs/day: 0.50    Years: 21.00    Pack years: 10.50    Types: Cigarettes    Start date: 01/21/1998   Smokeless tobacco: Former  Substance and Sexual Activity   Alcohol use: No   Drug use: Yes    Types: Marijuana    Comment: last used Marijuana in February 2018   Sexual activity: Yes    Birth control/protection: Injection  Other Topics Concern   Not on file  Social History Narrative   Lives w 3 children   Right handed   Caffeine: about 5 sodas/coffees in a month   Social Determinants of Corporate investment banker Strain: Not on file  Food Insecurity: Not on file  Transportation Needs: Not on file  Physical Activity: Not on file  Stress: Not on file  Social Connections: Not on file  Intimate Partner Violence: Not on file     PHYSICAL EXAM  Vitals:   10/05/20 1025  BP: 123/72  Pulse: 94  Weight: 143 lb (64.9 kg)  Height: 5\' 6"  (1.676 m)    Body mass index is 23.08 kg/m.   General: The patient is well-developed and well-nourished and in no acute distress  HEENT:  Head is [place]/AT.  Sclera are anicteric.  Funduscopic exam shows normal optic discs and retinal vessels.  Neck: No carotid bruits are noted.  The neck is very tender over the occiput on the right.  No tenderness on the left..  Cardiovascular: The heart has a regular rate and rhythm with a normal S1 and S2. There were no murmurs, gallops or rubs.    Skin: Extremities are without rash or  edema.  Musculoskeletal:  Back is nontender  Neurologic Exam  Mental status: The patient is alert and oriented x 3 at the time of the examination. The patient has apparent normal recent and remote memory, with an apparently normal attention span and concentration ability.   Speech is  normal.  Cranial nerves: Extraocular movements are full. Pupils are equal, round, and reactive to light and accomodation.  Visual fields are full.  Facial symmetry is present. There is good facial sensation to soft touch bilaterally.Facial strength is normal.  Trapezius and sternocleidomastoid strength is normal. No dysarthria is noted.  The tongue is midline, and the patient has symmetric elevation of the soft palate. No obvious hearing deficits are noted.  Motor:  Muscle bulk is normal.   Tone is normal. Strength is  5 / 5 in all 4 extremities.   Sensory: Sensory testing is intact to pinprick, soft touch and vibration sensation in all 4 extremities.  Coordination: Cerebellar testing reveals good finger-nose-finger and heel-to-shin bilaterally.  Gait and station: Station is normal.   Gait is normal. Tandem gait is normal. Romberg is negative.   Reflexes: Deep tendon reflexes are symmetric and normal bilaterally.   Plantar responses are flexor.    DIAGNOSTIC DATA (LABS, IMAGING, TESTING) - I reviewed patient records, labs, notes, testing and imaging myself where available.  Lab Results  Component Value Date   WBC 9.9 09/30/2020   HGB 14.3 09/30/2020   HCT 43.3 09/30/2020   MCV 92.7 09/30/2020   PLT 328 09/30/2020      Component Value Date/Time   NA 139 09/30/2020 1050   K 3.8 09/30/2020 1050   CL 104 09/30/2020 1050   CO2 25 09/30/2020 1050   GLUCOSE 82 09/30/2020 1050   BUN 11 09/30/2020 1050   CREATININE 0.80 09/30/2020 1050   CREATININE 0.74 02/26/2016 1433   CALCIUM 9.4 09/30/2020 1050   PROT 6.7 09/30/2020 1050   ALBUMIN 3.9 09/30/2020 1050   AST 15 09/30/2020 1050   ALT 11 09/30/2020 1050   ALKPHOS 44 09/30/2020 1050   BILITOT 0.5 09/30/2020 1050   GFRNONAA >60 09/30/2020 1050   GFRAA >60 04/22/2016 1111      ASSESSMENT AND PLAN  Chronic cluster headache, not intractable  Neck pain  Occipital neuralgia of right side  Other vascular  headache   In  summary, Selena Haynes is a 35 year old woman who has had severe headaches lasting 1 to 2 hours 1-3 times a day over the last couple months, many of the headaches occurring at night.  They have migrainous features.  The duration and time course are consistent with cluster headaches.  Additionally, she has tenderness over the right occipital nerve/splenius capitis muscle.  The headaches could be triggered by occipital neuralgia.  I went ahead and did a right occipital nerve block/splenius capitis trigger point with 80 mg Depo-Medrol in 3 cc Marcaine.  She tolerated the procedure well and pain was much better a couple minutes afterwards.  To help prevent the headaches, I started verapamil 180 mg daily.  Depending on her response we can either adjust the dose or switch to Topamax or another agent.  For the termination of headaches when they occur I prescribed sumatriptan and also gave her 2 samples of Ubrelvy 100 mg.  If not covered by insurance I will prescribe Maxalt.  She will return to see [place] in a month for further evaluation and adjustments in treatment.  She should call sooner if there are any major new or worsening neurologic symptoms.  Thank you for asking me to see Selena Haynes.  Please let me know if I can be of further assistance with her or other patients in the future.  Cole Eastridge A. Epimenio Foot, MD, [facility] 10/05/2020, 12:01 PM Certified in Neurology, Clinical Neurophysiology, Sleep Medicine and Neuroimaging  [facility] [address], [address] [place], [place] [address] [phone]

## 2020-10-31 ENCOUNTER — Other Ambulatory Visit: Payer: Self-pay | Admitting: Neurology

## 2020-11-06 ENCOUNTER — Telehealth: Payer: Self-pay | Admitting: Neurology

## 2020-11-06 ENCOUNTER — Ambulatory Visit: Payer: Medicaid Other | Admitting: Neurology

## 2020-11-06 NOTE — Telephone Encounter (Signed)
Pt called, would like a call from the nurse to discuss a sooner appt than 03/29/21

## 2020-11-06 NOTE — Telephone Encounter (Signed)
Called pt. She had to cx appt today. Not able to leave work per boss. Rescheduled appt for 11/08/20 at 930am, check in 900am w/ Dr. Epimenio Foot. Pt agreeable to date/time.

## 2020-11-08 ENCOUNTER — Ambulatory Visit: Payer: Medicaid Other | Admitting: Neurology

## 2020-11-08 ENCOUNTER — Encounter: Payer: Self-pay | Admitting: Neurology

## 2020-11-08 VITALS — BP 118/81 | HR 101 | Ht 66.0 in | Wt 142.0 lb

## 2020-11-08 DIAGNOSIS — G441 Vascular headache, not elsewhere classified: Secondary | ICD-10-CM

## 2020-11-08 DIAGNOSIS — M5481 Occipital neuralgia: Secondary | ICD-10-CM | POA: Diagnosis not present

## 2020-11-08 DIAGNOSIS — G44029 Chronic cluster headache, not intractable: Secondary | ICD-10-CM | POA: Diagnosis not present

## 2020-11-08 DIAGNOSIS — M542 Cervicalgia: Secondary | ICD-10-CM | POA: Diagnosis not present

## 2020-11-08 DIAGNOSIS — G43701 Chronic migraine without aura, not intractable, with status migrainosus: Secondary | ICD-10-CM

## 2020-11-08 MED ORDER — TOPIRAMATE 100 MG PO TABS: 100.0000 mg | ORAL_TABLET | Freq: Two times a day (BID) | ORAL | 11 refills | Status: DC

## 2020-11-08 NOTE — Progress Notes (Signed)
[facility]  PATIENT: Selena Haynes DOB: 1985/05/09  REFERRING DOCTOR OR PCP: Alona Bene, MD; Vergia Alberts, MD SOURCE: Patient, Notes from ED  _________________________________   HISTORICAL  CHIEF COMPLAINT:  Chief Complaint  Patient presents with   Follow-up    Pt alone, new rm. She develops a sharp pain that will hit intensely on the right side of her head, it will slow down but then lingers. The events aren't as long as they were before. She has had to give herself 3 shots. She continues the verapamil at bedtime. She states that it is still affecting her daily life and occurring frequently.     HISTORY OF PRESENT ILLNESS:  I had the pleasure of seeing your patient, Selena Haynes, at [facility] for neurologic consultation regarding her headaches.  Update 11/08/2020: She felt the occipital nerve block helped her HA's for a couple weeks but now HA's have returned.   She had 4 over the last 7 days.  She is having 15-18/30 days .     Verapamil has not helpd her much. NSAIDs don't help.     She has had occasional episodic migraines x many years.  These seem to be coming more frequently these were throbbing with N/V and photophobia   Taking an NSAID and laying down would help.   Sleep would end the migraine.    Since September, she has had frequent headaches radiating from the right occiput and to the right eye.  Vision seems to be blurry when that happens.  The most intense pain is in the forehead.  When the pain is more intense she also notes symptoms in the right arm.   These seem different than her episodic migraines.   She get nausea and vomiting.   She has waken up with N/V with a headache some times, as well.    These headaches are occurring a couple times a day - about 1/2 or more at night.     She has photophobia and phonophobia.   She also notes pain in the upper cervical spine when  the headaches are intense.       She has taken  Ibuprofen without benefit.  Fresh air has not helped.     MRI of the brain 09/30/2020 was normal.    MR angiogram of the neck and head 09/30/2020 was normal.  REVIEW OF SYSTEMS: Constitutional: No fevers, chills, sweats, or change in appetite Eyes: No visual changes, double vision, eye pain Ear, nose and throat: No hearing loss, ear pain, nasal congestion, sore throat Cardiovascular: No chest pain, palpitations Respiratory:  No shortness of breath at rest or with exertion.   No wheezes GastrointestinaI: No nausea, vomiting, diarrhea, abdominal pain, fecal incontinence Genitourinary:  No dysuria, urinary retention or frequency.  No nocturia. Musculoskeletal:  No neck pain, back pain Integumentary: No rash, pruritus, skin lesions Neurological: as above Psychiatric: No depression at this time.  No anxiety Endocrine: No palpitations, diaphoresis, change in appetite, change in weigh or increased thirst Hematologic/Lymphatic:  No anemia, purpura, petechiae. Allergic/Immunologic: No itchy/runny eyes, nasal congestion, recent allergic reactions, rashes  ALLERGIES: No Known Allergies  HOME MEDICATIONS:  Current Outpatient Medications:    acetaminophen (TYLENOL) 325 MG tablet, Take 2 tablets (650 mg total) by mouth every 4 (four) hours as needed (for pain scale < 4)., Disp: 30 tablet, Rfl: 0   albuterol (VENTOLIN HFA) 108 (90 Base) MCG/ACT inhaler, Inhale 1-2 puffs into the lungs every 6 (six) hours as  needed for wheezing or shortness of breath., Disp: 18 g, Rfl: 0   budesonide-formoterol (SYMBICORT) 160-4.5 MCG/ACT inhaler, Inhale 2 puffs into the lungs 2 (two) times daily., Disp: 10.2 g, Rfl: 0   cetirizine (ZYRTEC) 10 MG tablet, Take 1 tablet (10 mg total) by mouth daily., Disp: 30 tablet, Rfl: 12   cyclobenzaprine (FLEXERIL) 10 MG tablet, Take 1 tablet (10 mg total) by mouth 2 (two) times daily as needed for muscle spasms., Disp: 20 tablet, Rfl: 0   dicyclomine (BENTYL) 20 MG tablet, Take 1  tablet (20 mg total) by mouth 4 (four) times daily -  before meals and at bedtime., Disp: 20 tablet, Rfl: 0   hydrocortisone cream 1 %, Apply 1 application topically 2 (two) times daily as needed for itching. otc medication for itching, Disp: , Rfl:    hydrOXYzine (VISTARIL) 25 MG capsule, Take 25 mg by mouth 3 (three) times daily as needed for itching. , Disp: , Rfl:    ibuprofen (ADVIL) 600 MG tablet, Take 1 tablet (600 mg total) by mouth every 6 (six) hours as needed., Disp: 30 tablet, Rfl: 0   ipratropium-albuterol (DUONEB) 0.5-2.5 (3) MG/3ML SOLN, Take 3 mLs by nebulization every 4 (four) hours as needed., Disp: 360 mL, Rfl: 0   ondansetron (ZOFRAN ODT) 4 MG disintegrating tablet, Take 1-2 tablets (4-8 mg total) by mouth every 8 (eight) hours as needed for nausea or vomiting., Disp: 20 tablet, Rfl: 0   pantoprazole (PROTONIX) 40 MG tablet, Take 30- 60 min before your first and last meals of the day, Disp: 60 tablet, Rfl: 2   SUMAtriptan (IMITREX) 6 MG/0.5ML SOLN injection, Inject 0.5 mLs (6 mg total) into the skin every 2 (two) hours as needed for migraine or headache. May repeat in 2 hours if headache persists or recurs.    No more than 4/week, Disp: 6 mL, Rfl: 2   verapamil (CALAN-SR) 180 MG CR tablet, Take 1 tablet (180 mg total) by mouth at bedtime., Disp: 30 tablet, Rfl: 2   fluticasone (FLONASE) 50 MCG/ACT nasal spray, Place 1-2 sprays into both nostrils daily for 7 days., Disp: 1 g, Rfl: 0   topiramate (TOPAMAX) 100 MG tablet, Take 1 tablet (100 mg total) by mouth 2 (two) times daily., Disp: 30 tablet, Rfl: 11  PAST MEDICAL HISTORY: Past Medical History:  Diagnosis Date   Anxiety    Asthma    Back pain    Carpal tunnel syndrome    Hypertension    Post traumatic stress disorder (PTSD)    Postpartum depression     PAST SURGICAL HISTORY: Past Surgical History:  Procedure Laterality Date   CESAREAN SECTION      FAMILY HISTORY: Family History  Problem Relation Age of Onset    Diabetes Mother    Asthma Sister    Asthma Brother    Hypertension Maternal Aunt    Diabetes Maternal Uncle    Hypertension Maternal Grandmother    Diabetes Maternal Grandmother     SOCIAL HISTORY:  Social History   Socioeconomic History   Marital status: Single    Spouse name: Not on file   Number of children: 3   Years of education: Not on file   Highest education level: High school graduate  Occupational History   Not on file  Tobacco Use   Smoking status: Some Days    Packs/day: 0.50    Years: 21.00    Pack years: 10.50    Types: Cigarettes    Start  date: 01/21/1998   Smokeless tobacco: Former  Substance and Sexual Activity   Alcohol use: No   Drug use: Yes    Types: Marijuana    Comment: last used Marijuana in February 2018   Sexual activity: Yes    Birth control/protection: Injection  Other Topics Concern   Not on file  Social History Narrative   Lives w 3 children   Right handed   Caffeine: about 5 sodas/coffees in a month   Social Determinants of Health   Financial Resource Strain: Not on file  Food Insecurity: Not on file  Transportation Needs: Not on file  Physical Activity: Not on file  Stress: Not on file  Social Connections: Not on file  Intimate Partner Violence: Not on file     PHYSICAL EXAM  Vitals:   11/08/20 0900  BP: 118/81  Pulse: (!) 101  Weight: 142 lb (64.4 kg)  Height: 5\' 6"  (1.676 m)    Body mass index is 22.92 kg/m.   General: The patient is well-developed and well-nourished and in no acute distress.  She has moderate tenderness over the right occiput.  Milder tenderness on the left  HEENT:  Head is [place]/AT.  Sclera are anicteric.    Skin: Extremities are without rash or  edema.  Musculoskeletal:  Back is nontender  Neurologic Exam  Mental status: The patient is alert and oriented x 3 at the time of the examination. The patient has apparent normal recent and remote memory, with an apparently normal attention span and  concentration ability.   Speech is normal.  Cranial nerves: Extraocular movements are full. Pupils are equal, round, and reactive to light and accomodation.  Facial strength and sensation was normal.. No obvious hearing deficits are noted.  Motor:  Muscle bulk is normal.   Tone is normal. Strength is  5 / 5 in all 4 extremities.   Sensory: Sensory testing is intact to pinprick, soft touch and vibration sensation in all 4 extremities.  Coordination: Cerebellar testing reveals good finger-nose-finger and heel-to-shin bilaterally.  Gait and station: Station is normal.   Gait is normal. Tandem gait is normal. Romberg is negative.   Reflexes: Deep tendon reflexes are symmetric and normal bilaterally.        DIAGNOSTIC DATA (LABS, IMAGING, TESTING) - I reviewed patient records, labs, notes, testing and imaging myself where available.  Lab Results  Component Value Date   WBC 9.9 09/30/2020   HGB 14.3 09/30/2020   HCT 43.3 09/30/2020   MCV 92.7 09/30/2020   PLT 328 09/30/2020      Component Value Date/Time   NA 139 09/30/2020 1050   K 3.8 09/30/2020 1050   CL 104 09/30/2020 1050   CO2 25 09/30/2020 1050   GLUCOSE 82 09/30/2020 1050   BUN 11 09/30/2020 1050   CREATININE 0.80 09/30/2020 1050   CREATININE 0.74 02/26/2016 1433   CALCIUM 9.4 09/30/2020 1050   PROT 6.7 09/30/2020 1050   ALBUMIN 3.9 09/30/2020 1050   AST 15 09/30/2020 1050   ALT 11 09/30/2020 1050   ALKPHOS 44 09/30/2020 1050   BILITOT 0.5 09/30/2020 1050   GFRNONAA >60 09/30/2020 1050   GFRAA >60 04/22/2016 1111      ASSESSMENT AND PLAN  Chronic cluster headache, not intractable  Occipital neuralgia of right side  Neck pain  Other vascular headache  Chronic migraine w/o aura, not intractable, w stat migr   She is only getting minimal benefit from the verapamil as a prophylactic medication.  I we will switch her to Topamax.  She will overlap for the first week or 2.   Right occipital nerve  block/splenius capitis trigger point with 80 mg Depo-Medrol in 3 cc Marcaine.  She tolerated the procedure well and pain was much better a couple minutes afterwards. 3.   She will continue to take Imitrex injections when one of her worst headaches occur.   4.   Return in 3 months or sooner for new or worsening neurologic symptoms.     Diangelo Radel A. Epimenio Foot, MD, [facility] 11/08/2020, 9:45 AM Certified in Neurology, Clinical Neurophysiology, Sleep Medicine and Neuroimaging  [facility] [address], [address] [place], [place] [address] [phone]

## 2020-12-01 ENCOUNTER — Other Ambulatory Visit: Payer: Self-pay | Admitting: Neurology

## 2021-02-15 ENCOUNTER — Other Ambulatory Visit: Payer: Self-pay

## 2021-02-15 ENCOUNTER — Ambulatory Visit (INDEPENDENT_AMBULATORY_CARE_PROVIDER_SITE_OTHER): Payer: Medicaid Other | Admitting: *Deleted

## 2021-02-15 VITALS — BP 120/64 | HR 90 | Ht 66.0 in | Wt 145.7 lb

## 2021-02-15 DIAGNOSIS — Z202 Contact with and (suspected) exposure to infections with a predominantly sexual mode of transmission: Secondary | ICD-10-CM

## 2021-02-15 NOTE — Progress Notes (Signed)
States wants to check for herpes. States she had a break out in her perineal area that lasted about 2 months. States one would get better , then another popped up. Used tea tree oil and over the counter herpes cream" femiclear" . I discussed her request with Dr. Donavan Foil. Instructed patient it is best to actually test when she has a breakout and test the lesion. Advised her to call when she has another breakout and tell registrar she needs to be seen when she has a lesion. We also discussed she needs to have an annual exam. She reports she has made an appointment for March , first available, and will discuss period issues then also. Crystle Carelli,RN

## 2021-02-15 NOTE — Progress Notes (Signed)
Patient was assessed and managed by nursing staff during this encounter. I have reviewed the chart and agree with the documentation and plan. I have also made any necessary editorial changes.  Griffin Basil, MD 02/15/2021 5:24 PM

## 2021-02-19 ENCOUNTER — Encounter (HOSPITAL_COMMUNITY): Payer: Self-pay | Admitting: Emergency Medicine

## 2021-02-19 ENCOUNTER — Ambulatory Visit (HOSPITAL_COMMUNITY)
Admission: EM | Admit: 2021-02-19 | Discharge: 2021-02-19 | Disposition: A | Discharge status: Home / Self Care | Payer: Medicaid Other | Attending: Family Medicine | Admitting: Family Medicine

## 2021-02-19 ENCOUNTER — Other Ambulatory Visit: Payer: Self-pay

## 2021-02-19 DIAGNOSIS — N898 Other specified noninflammatory disorders of vagina: Secondary | ICD-10-CM | POA: Insufficient documentation

## 2021-02-19 DIAGNOSIS — R599 Enlarged lymph nodes, unspecified: Secondary | ICD-10-CM | POA: Insufficient documentation

## 2021-02-19 MED ORDER — SULFAMETHOXAZOLE-TRIMETHOPRIM 800-160 MG PO TABS: 1.0000 | ORAL_TABLET | Freq: Two times a day (BID) | ORAL | 0 refills | Status: AC

## 2021-02-19 NOTE — ED Provider Notes (Signed)
[facility]    CSN: [id number] Arrival date & time: 02/19/21  6759      History   Chief Complaint Chief Complaint  Patient presents with   Groin Swelling    HPI Selena Haynes is a 36 y.o. female.   Patient is here for swollen Lns in the groin area, and "breakouts" in the vaginal, pubic area.  These breakouts have been coming and going the last several months.  This most recent "breakout" was last week Thursday.   She did have some vaginal d/c, white in color.  No itching.  No odor.  Her period has been irregular.  She may get it twice/month or just once/month.  Has not been sexually active x 4 months.  Has been a while since sti screening.   Past Medical History:  Diagnosis Date   Anxiety    Asthma    Back pain    Carpal tunnel syndrome    Hypertension    Post traumatic stress disorder (PTSD)    Postpartum depression     Patient Active Problem List   Diagnosis Date Noted   Chronic migraine w/o aura, not intractable, w stat migr 11/08/2020   Chronic cluster headache, not intractable 10/05/2020   Neck pain 10/05/2020   Occipital neuralgia of right side 10/05/2020   Cephalalgia 10/05/2020   Allergic rhinitis 10/26/2019   Cigarette smoker 10/26/2019   Healthcare maintenance 10/26/2019   Cough variant asthma 01/29/2018   Bronchitis with asthma, acute 01/09/2018   Thrush, oral 01/09/2018   Rhinitis, nonallergic, chronic 09/24/2016   GERD without esophagitis 09/24/2016   Carpal tunnel syndrome, right upper limb 05/22/2016   Anxiety disorder affecting pregnancy, antepartum 05/22/2016   Previous cesarean delivery affecting pregnancy, antepartum 02/26/2016    Past Surgical History:  Procedure Laterality Date   CESAREAN SECTION      OB History     Gravida  5   Para  3   Term  3   Preterm  0   AB  2   Living  3      SAB  2   IAB  0   Ectopic  0   Multiple  0   Live Births  3            Home Medications    Prior to  Admission medications   Medication Sig Start Date End Date Taking? Authorizing Provider  acetaminophen (TYLENOL) 325 MG tablet Take 2 tablets (650 mg total) by mouth every 4 (four) hours as needed (for pain scale < 4). 10/05/16   Winfrey, Harlen Labs, MD  albuterol (VENTOLIN HFA) 108 (90 Base) MCG/ACT inhaler Inhale 1-2 puffs into the lungs every 6 (six) hours as needed for wheezing or shortness of breath. 10/18/19   Wieters, Hallie C, PA-C  budesonide-formoterol (SYMBICORT) 160-4.5 MCG/ACT inhaler Inhale 2 puffs into the lungs 2 (two) times daily. 10/18/19   Wieters, Hallie C, PA-C  cetirizine (ZYRTEC) 10 MG tablet Take 1 tablet (10 mg total) by mouth daily. 10/26/19   Coral Ceo, NP  cyclobenzaprine (FLEXERIL) 10 MG tablet Take 1 tablet (10 mg total) by mouth 2 (two) times daily as needed for muscle spasms. 02/14/19   Merrilee Jansky, MD  diclofenac (VOLTAREN) 75 MG EC tablet Take by mouth. 02/13/21 02/27/21  [provider]  dicyclomine (BENTYL) 20 MG tablet Take 1 tablet (20 mg total) by mouth 4 (four) times daily -  before meals and at bedtime. 08/28/19   Sharyon Cable, [employer]  C, PA-C  fluticasone (FLONASE) 50 MCG/ACT nasal spray Place 1-2 sprays into both nostrils daily for 7 days. 10/18/19 10/25/19  Wieters, Hallie C, PA-C  hydrocortisone cream 1 % Apply 1 application topically 2 (two) times daily as needed for itching. otc medication for itching    [provider]  hydrOXYzine (VISTARIL) 25 MG capsule Take 25 mg by mouth 3 (three) times daily as needed for itching.     [provider]  ibuprofen (ADVIL) 600 MG tablet Take 1 tablet (600 mg total) by mouth every 6 (six) hours as needed. 02/14/19   Lamptey, Britta Mccreedy, MD  ipratropium-albuterol (DUONEB) 0.5-2.5 (3) MG/3ML SOLN Take 3 mLs by nebulization every 4 (four) hours as needed. 10/18/19   Wieters, Hallie C, PA-C  ondansetron (ZOFRAN ODT) 4 MG disintegrating tablet Take 1-2 tablets (4-8 mg total) by mouth every 8 (eight) hours as  needed for nausea or vomiting. 08/28/19   Wieters, Hallie C, PA-C  pantoprazole (PROTONIX) 40 MG tablet Take 30- 60 min before your first and last meals of the day Patient not taking: Reported on 02/15/2021 11/29/19   Nyoka Cowden, MD  SUMAtriptan (IMITREX) 6 MG/0.5ML SOLN injection Inject 0.5 mLs (6 mg total) into the skin every 2 (two) hours as needed for migraine or headache. May repeat in 2 hours if headache persists or recurs.    No more than 4/week 10/05/20   Sater, Pearletha Furl, MD  topiramate (TOPAMAX) 100 MG tablet Take 1 tablet (100 mg total) by mouth 2 (two) times daily. 11/08/20   Sater, Pearletha Furl, MD  verapamil (CALAN-SR) 180 MG CR tablet Take 1 tablet (180 mg total) by mouth at bedtime. 10/05/20   Sater, Pearletha Furl, MD    Family History Family History  Problem Relation Age of Onset   Diabetes Mother    Asthma Sister    Asthma Brother    Hypertension Maternal Aunt    Diabetes Maternal Uncle    Hypertension Maternal Grandmother    Diabetes Maternal Grandmother     Social History Social History   Tobacco Use   Smoking status: Some Days    Packs/day: 0.50    Years: 21.00    Pack years: 10.50    Types: Cigarettes    Start date: 01/21/1998   Smokeless tobacco: Former  Substance Use Topics   Alcohol use: No   Drug use: Yes    Types: Marijuana    Comment: last used Marijuana in February 2018     Allergies   Patient has no known allergies.   Review of Systems Review of Systems  Constitutional: Negative.   HENT: Negative.    Respiratory: Negative.    Cardiovascular: Negative.   Gastrointestinal: Negative.   Genitourinary:  Positive for genital sores, menstrual problem and vaginal discharge. Negative for difficulty urinating, dysuria, frequency and urgency.    Physical Exam Triage Vital Signs ED Triage Vitals  Enc Vitals Group     BP 02/19/21 1057 111/70     Pulse Rate 02/19/21 1057 93     Resp 02/19/21 1057 16     Temp 02/19/21 1057 99.3 F (37.4 C)     Temp  Source 02/19/21 1057 Oral     SpO2 02/19/21 1057 96 %     Weight 02/19/21 1058 145 lb 11.2 oz (66.1 kg)     Height 02/19/21 1058 5\' 6"  (1.676 m)     Head Circumference --      Peak Flow --  Pain Score 02/19/21 1057 8     Pain Loc --      Pain Edu? --      Excl. in GC? --    No data found.  Updated Vital Signs BP 111/70    Pulse 93    Temp 99.3 F (37.4 C) (Oral)    Resp 16    Ht 5\' 6"  (1.676 m)    Wt 66.1 kg    SpO2 96%    BMI 23.52 kg/m   Visual Acuity Right Eye Distance:   Left Eye Distance:   Bilateral Distance:    Right Eye Near:   Left Eye Near:    Bilateral Near:     Physical Exam Constitutional:      Appearance: Normal appearance.  Abdominal:     General: Bowel sounds are normal.     Palpations: Abdomen is soft.     Tenderness: There is no abdominal tenderness.  Genitourinary:    Comments: At the right superior labia is a small bump/sore;  this area is tender;  not open, no ulceration noted;  no erythema or drainage noted.  Lymphadenopathy:     Lower Body: No right inguinal adenopathy. No left inguinal adenopathy.  Neurological:     Mental Status: She is alert.     UC Treatments / Results  Labs (all labs ordered are listed, but only abnormal results are displayed) Labs Reviewed - No data to display  EKG   Radiology No results found.  Procedures Procedures (including critical care time)  Medications Ordered in UC Medications - No data to display  Initial Impression / Assessment and Plan / UC Course  I have reviewed the triage vital signs and the nursing notes.  Pertinent labs & imaging results that were available during my care of the patient were reviewed by me and considered in my medical decision making (see chart for details).   Patient was seen today for recurrent outbreaks/sores in the pubic area, along with enlarged lymph nodes in the groin.  Lymph nodes are improved today.  Exam shows possible infected hair follicle.  However, I did  swab this for HSV today.  Discussion about this at length.  This sore is not always typical of her usual sores.  If she develops a sore/ulceration that is painful, tingling, etc, then she should return for culture of that lesion, as that sound more typical of hsv.  She is aware and understands.   Final Clinical Impressions(s) / UC Diagnoses   Final diagnoses:  Vaginal discharge  Enlarged lymph nodes  Vaginal sore     Discharge Instructions      You were seen today for vaginal sores/lesions, and enlarged groin lymph nodes.  As discussed, I have done a swab for herpes and a swab for STDs.  These will be resulted tomorrow.  I have sent out antibiotics to take twice/day x 7 days for now for possible bacterial infection.  If the herpes test is positive, please follow up with your primary care provider for further discussion and treatment of this.     ED Prescriptions     Medication Sig Dispense Auth. Provider   sulfamethoxazole-trimethoprim (BACTRIM DS) 800-160 MG tablet Take 1 tablet by mouth 2 (two) times daily for 7 days. 14 tablet Jannifer FranklinPiontek, Katura Eatherly, MD      PDMP not reviewed this encounter.   Jannifer FranklinPiontek, Darielys Giglia, MD 02/19/21 1133

## 2021-02-19 NOTE — ED Triage Notes (Signed)
Pt reports intermittent lymph node swelling for the past few months in the groin area and blisters in the vaginal area. Denies any known exposures to any STDs.

## 2021-02-19 NOTE — Discharge Instructions (Addendum)
You were seen today for vaginal sores/lesions, and enlarged groin lymph nodes.  As discussed, I have done a swab for herpes and a swab for STDs.  These will be resulted tomorrow.  I have sent out antibiotics to take twice/day x 7 days for now for possible bacterial infection.  If the herpes test is positive, please follow up with your primary care provider for further discussion and treatment of this.

## 2021-02-20 LAB — CERVICOVAGINAL ANCILLARY ONLY
Bacterial Vaginitis (gardnerella): NEGATIVE
Candida Glabrata: NEGATIVE
Candida Vaginitis: NEGATIVE
Chlamydia: NEGATIVE
Comment: NEGATIVE
Comment: NEGATIVE
Comment: NEGATIVE
Comment: NEGATIVE
Comment: NEGATIVE
Comment: NORMAL
Neisseria Gonorrhea: NEGATIVE
Trichomonas: NEGATIVE

## 2021-02-21 LAB — HSV CULTURE AND TYPING

## 2021-02-26 ENCOUNTER — Ambulatory Visit: Payer: Medicaid Other | Admitting: Obstetrics and Gynecology

## 2021-02-26 ENCOUNTER — Encounter: Payer: Self-pay | Admitting: Obstetrics and Gynecology

## 2021-02-27 NOTE — Progress Notes (Signed)
Patient did not keep her GYN appointment for 02/26/2021.  Cornelia Copa MD Attending Center for [employer] [facility])

## 2021-03-16 ENCOUNTER — Encounter: Payer: Self-pay | Admitting: Neurology

## 2021-03-21 ENCOUNTER — Ambulatory Visit: Payer: Medicaid Other | Admitting: Neurology

## 2021-03-29 ENCOUNTER — Ambulatory Visit: Payer: Medicaid Other | Admitting: Neurology

## 2021-04-20 ENCOUNTER — Ambulatory Visit: Payer: Medicaid Other | Admitting: Medical

## 2021-05-15 ENCOUNTER — Ambulatory Visit: Payer: Medicaid Other | Admitting: Neurology

## 2021-05-15 ENCOUNTER — Telehealth: Payer: Self-pay | Admitting: Neurology

## 2021-05-15 ENCOUNTER — Encounter: Payer: Self-pay | Admitting: Neurology

## 2021-05-15 VITALS — BP 109/64 | HR 102 | Ht 66.0 in | Wt 142.0 lb

## 2021-05-15 DIAGNOSIS — M542 Cervicalgia: Secondary | ICD-10-CM

## 2021-05-15 DIAGNOSIS — G43701 Chronic migraine without aura, not intractable, with status migrainosus: Secondary | ICD-10-CM

## 2021-05-15 DIAGNOSIS — G44029 Chronic cluster headache, not intractable: Secondary | ICD-10-CM | POA: Diagnosis not present

## 2021-05-15 DIAGNOSIS — R200 Anesthesia of skin: Secondary | ICD-10-CM

## 2021-05-15 MED ORDER — EMGALITY 120 MG/ML ~~LOC~~ SOAJ: 1.0000 "pen " | SUBCUTANEOUS | 4 refills | Status: DC

## 2021-05-15 NOTE — Telephone Encounter (Signed)
PA completed on CMM/[employer] ?[id number] ?PA approved:  ?PA Case: [id number], Status: Approved, Coverage Starts on: 05/15/2021 12:00:00 AM, Coverage Ends on: 08/13/2021 ?

## 2021-05-15 NOTE — Progress Notes (Signed)
? ?[facility] ? ?PATIENT: Selena Haynes ?DOB: 1985-03-10 ? ?[place] [facility] PCP: Nanda Quinton, MD; Jerelyn Scott, MD ?SOURCE: Patient, Notes from ED ? ?_________________________________ ? ? ?HISTORICAL ? ?CHIEF COMPLAINT:  ?Chief Complaint  ?Patient presents with  ? Follow-up  ?  Pt alone, rm 2. Continues to still have difficulties with headaches. She is having them daily. Sometimes twice a day. It comes intermittently. Always on right side of the head. Stopped verapamil and started topamax. Overall tolerated topamax well and noticed decrease in headaches. Rather than daily it was more every other day. She ran out of refills and wasn't aware she should continue  ? ? ?HISTORY OF PRESENT ILLNESS:  ?Selena Haynes is a 36 y.o. woman with headaches. ? ?Update 05/15/2021: ?Migrainous headaches are now occurring every day, sometimes a few a day.  They last 30-120 minutes.  She notes the right face looks weak when she looks in a mirror during a migraine.    Pain is intense.     Associated with photophobia and phonophobia.  Moving her head may make it worse but she becomes restless and shifts position frequently.    ? ?She also has pain in the back of the head.    The occipital nerve block helped her x a few days only.    ? ?When she has a HA, she becomes very irritable.      ? ?Medications tried: Verapamil, muscle relaxants (cyclobenzaprine and tizanidine), Topamax and NSAIDs have not helped.     Imitrex injections have helped som but HA returns the next day.  Triptan pills help a little. Fresh air has not helped ? ?She felt the occipital nerve block helped her HA's for a couple weeks but now HA's have returned.   She had 4 over the last 7 days.  She is having 15-18/30 days .     Verapamil has not helpd her much. NSAIDs don't help.    ? ?She has had occasional episodic migraines x many years.  These seem to be coming more frequently these were throbbing with N/V and photophobia   Taking an NSAID  and laying down would help.   Sleep would end the migraine.   ? ?HA History from 2022: ?Since September, she has had frequent headaches radiating from the right occiput and to the right eye.  Vision seems to be blurry when that happens.  The most intense pain is in the forehead.  When the pain is more intense she also notes symptoms in the right arm.   These seem different than her episodic migraines.   She get nausea and vomiting.   She has waken up with N/V with a headache some times, as well.    These headaches are occurring a couple times a day - about 1/2 or more at night.     She has photophobia and phonophobia.   She also notes pain in the upper cervical spine when  the headaches are intense.        ? ?MRI of the brain 09/30/2020 was normal.    MR angiogram of the neck and head 09/30/2020 was normal. ? ?REVIEW OF SYSTEMS: ?Constitutional: No fevers, chills, sweats, or change in appetite ?Eyes: No visual changes, double vision, eye pain ?Ear, nose and throat: No hearing loss, ear pain, nasal congestion, sore throat ?Cardiovascular: No chest pain, palpitations ?Respiratory:  No shortness of breath at rest or with exertion.   No wheezes ?GastrointestinaI: No nausea, vomiting, diarrhea, abdominal pain,  fecal incontinence ?Genitourinary:  No dysuria, urinary retention or frequency.  No nocturia. ?Musculoskeletal:  No neck pain, back pain ?Integumentary: No rash, pruritus, skin lesions ?Neurological: as above ?Psychiatric: No depression at this time.  No anxiety ?Endocrine: No palpitations, diaphoresis, change in appetite, change in weigh or increased thirst ?Hematologic/Lymphatic:  No anemia, purpura, petechiae. ?Allergic/Immunologic: No itchy/runny eyes, nasal congestion, recent allergic reactions, rashes ? ?ALLERGIES: ?No Known Allergies ? ?HOME MEDICATIONS: ? ?Current Outpatient Medications:  ?  acetaminophen (TYLENOL) 325 MG tablet, Take 2 tablets (650 mg total) by mouth every 4 (four) hours as needed (for pain  scale < 4)., Disp: 30 tablet, Rfl: 0 ?  albuterol (VENTOLIN HFA) 108 (90 Base) MCG/ACT inhaler, Inhale 1-2 puffs into the lungs every 6 (six) hours as needed for wheezing or shortness of breath., Disp: 18 g, Rfl: 0 ?  budesonide-formoterol (SYMBICORT) 160-4.5 MCG/ACT inhaler, Inhale 2 puffs into the lungs 2 (two) times daily., Disp: 10.2 g, Rfl: 0 ?  cetirizine (ZYRTEC) 10 MG tablet, Take 1 tablet (10 mg total) by mouth daily., Disp: 30 tablet, Rfl: 12 ?  cyclobenzaprine (FLEXERIL) 10 MG tablet, Take 1 tablet (10 mg total) by mouth 2 (two) times daily as needed for muscle spasms., Disp: 20 tablet, Rfl: 0 ?  Galcanezumab-gnlm (EMGALITY) 120 MG/ML SOAJ, Inject 1 pen. into the skin every 28 (twenty-eight) days., Disp: 3 mL, Rfl: 4 ?  hydrocortisone cream 1 %, Apply 1 application topically 2 (two) times daily as needed for itching. otc medication for itching, Disp: , Rfl:  ?  hydrOXYzine (VISTARIL) 25 MG capsule, Take 25 mg by mouth 3 (three) times daily as needed for itching. , Disp: , Rfl:  ?  ibuprofen (ADVIL) 600 MG tablet, Take 1 tablet (600 mg total) by mouth every 6 (six) hours as needed., Disp: 30 tablet, Rfl: 0 ?  ipratropium-albuterol (DUONEB) 0.5-2.5 (3) MG/3ML SOLN, Take 3 mLs by nebulization every 4 (four) hours as needed., Disp: 360 mL, Rfl: 0 ?  ondansetron (ZOFRAN ODT) 4 MG disintegrating tablet, Take 1-2 tablets (4-8 mg total) by mouth every 8 (eight) hours as needed for nausea or vomiting., Disp: 20 tablet, Rfl: 0 ?  pantoprazole (PROTONIX) 40 MG tablet, Take 30- 60 min before your first and last meals of the day, Disp: 60 tablet, Rfl: 2 ?  SUMAtriptan (IMITREX) 6 MG/0.5ML SOLN injection, Inject 0.5 mLs (6 mg total) into the skin every 2 (two) hours as needed for migraine or headache. May repeat in 2 hours if headache persists or recurs.    No more than 4/week, Disp: 6 mL, Rfl: 2 ?  topiramate (TOPAMAX) 100 MG tablet, Take 1 tablet (100 mg total) by mouth 2 (two) times daily. (Patient not taking:  Reported on 05/15/2021), Disp: 30 tablet, Rfl: 11 ? ?PAST MEDICAL HISTORY: ?Past Medical History:  ?Diagnosis Date  ? Anxiety   ? Asthma   ? Back pain   ? Carpal tunnel syndrome   ? Hypertension   ? Post traumatic stress disorder (PTSD)   ? Postpartum depression   ? ? ?PAST SURGICAL HISTORY: ?Past Surgical History:  ?Procedure Laterality Date  ? CESAREAN SECTION    ? ? ?FAMILY HISTORY: ?Family History  ?Problem Relation Age of Onset  ? Diabetes Mother   ? Asthma Sister   ? Asthma Brother   ? Hypertension Maternal Aunt   ? Diabetes Maternal Uncle   ? Hypertension Maternal Grandmother   ? Diabetes Maternal Grandmother   ? ? ?SOCIAL HISTORY: ? ?  Social History  ? ?Socioeconomic History  ? Marital status: Single  ?  Spouse name: Not on file  ? Number of children: 3  ? Years of education: Not on file  ? Highest education level: High school graduate  ?Occupational History  ? Not on file  ?Tobacco Use  ? Smoking status: Some Days  ?  Packs/day: 0.50  ?  Years: 21.00  ?  Pack years: 10.50  ?  Types: Cigarettes  ?  Start date: 01/21/1998  ? Smokeless tobacco: Former  ?Substance and Sexual Activity  ? Alcohol use: No  ? Drug use: Yes  ?  Types: Marijuana  ?  Comment: last used Marijuana in February 2018  ? Sexual activity: Yes  ?  Birth control/protection: Injection  ?Other Topics Concern  ? Not on file  ?Social History Narrative  ? Lives w 3 children  ? Right handed  ? Caffeine: about 5 sodas/coffees in a month  ? ?Social Determinants of Health  ? ?Financial Resource Strain: Not on file  ?Food Insecurity: Not on file  ?Transportation Needs: Not on file  ?Physical Activity: Not on file  ?Stress: Not on file  ?Social Connections: Not on file  ?Intimate Partner Violence: Not on file  ? ? ? ?PHYSICAL EXAM ? ?Vitals:  ? 05/15/21 1011  ?BP: 109/64  ?Pulse: (!) 102  ?Weight: 142 lb (64.4 kg)  ?Height: 5\' 6"  (1.676 m)  ? ? ?Body mass index is 22.92 kg/m?. ? ? ?General: The patient is well-developed and well-nourished and in no acute  distress.  She has moderate tenderness over the right occiput.  Milder tenderness on the left ? ?HEENT:  Head is [place]/AT.  Sclera are anicteric.   ? ?Skin: Extremities are without rash or  edema. ? ?Musculoskele

## 2021-06-19 ENCOUNTER — Ambulatory Visit (INDEPENDENT_AMBULATORY_CARE_PROVIDER_SITE_OTHER): Payer: Medicaid Other | Admitting: Student

## 2021-06-19 ENCOUNTER — Other Ambulatory Visit (HOSPITAL_COMMUNITY)
Admission: RE | Admit: 2021-06-19 | Discharge: 2021-06-19 | Disposition: A | Discharge status: Home / Self Care | Payer: Medicaid Other | Source: Ambulatory Visit | Attending: Obstetrics and Gynecology | Admitting: Obstetrics and Gynecology

## 2021-06-19 VITALS — BP 123/52 | HR 89 | Wt 147.0 lb

## 2021-06-19 DIAGNOSIS — B009 Herpesviral infection, unspecified: Secondary | ICD-10-CM

## 2021-06-19 DIAGNOSIS — Z113 Encounter for screening for infections with a predominantly sexual mode of transmission: Secondary | ICD-10-CM | POA: Diagnosis present

## 2021-06-19 MED ORDER — VALACYCLOVIR HCL 1 G PO TABS: 1000.0000 mg | ORAL_TABLET | Freq: Every day | ORAL | 6 refills | Status: DC

## 2021-06-19 MED ORDER — VALACYCLOVIR HCL 1 G PO TABS: 1000.0000 mg | ORAL_TABLET | Freq: Two times a day (BID) | ORAL | 0 refills | Status: DC

## 2021-06-19 NOTE — Progress Notes (Signed)
Patient here today requesting to be tested for HSV. Per patient she believes she is experiencing a "breakout." Patient states she has never been diagnosed with HSV however, she had a partner in the past that she believes had HSV. Patient states she feels run down and has vaginal soreness and irritation with "sores."  Alesia Richards, RN 06/19/21

## 2021-06-19 NOTE — Progress Notes (Signed)
History:  Selena Haynes is a 36 y.o. [id number] who presents to clinic today for vaginal irritation. That has been a recurrent issue since the beginning of the year & occurs with her menstrual cycles. Current symptoms started last Thursday with vaginal swelling, irritation, burning, & left groin swelling/pain. Denies vaginal discharge. Has not been sexually active since January.    Patient Active Problem List   Diagnosis Date Noted   Right sided numbness 05/15/2021   Chronic migraine w/o aura, not intractable, w stat migr 11/08/2020   Chronic cluster headache, not intractable 10/05/2020   Neck pain 10/05/2020   Occipital neuralgia of right side 10/05/2020   Cephalalgia 10/05/2020   Allergic rhinitis 10/26/2019   Cigarette smoker 10/26/2019   Healthcare maintenance 10/26/2019   Cough variant asthma 01/29/2018   Bronchitis with asthma, acute 01/09/2018   Thrush, oral 01/09/2018   Rhinitis, nonallergic, chronic 09/24/2016   GERD without esophagitis 09/24/2016   Carpal tunnel syndrome, right upper limb 05/22/2016   Anxiety disorder affecting pregnancy, antepartum 05/22/2016   Previous cesarean delivery affecting pregnancy, antepartum 02/26/2016    No Known Allergies  Current Outpatient Medications on File Prior to Visit  Medication Sig Dispense Refill   acetaminophen (TYLENOL) 325 MG tablet Take 2 tablets (650 mg total) by mouth every 4 (four) hours as needed (for pain scale < 4). 30 tablet 0   albuterol (VENTOLIN HFA) 108 (90 Base) MCG/ACT inhaler Inhale 1-2 puffs into the lungs every 6 (six) hours as needed for wheezing or shortness of breath. 18 g 0   budesonide-formoterol (SYMBICORT) 160-4.5 MCG/ACT inhaler Inhale 2 puffs into the lungs 2 (two) times daily. 10.2 g 0   cetirizine (ZYRTEC) 10 MG tablet Take 1 tablet (10 mg total) by mouth daily. 30 tablet 12   cyclobenzaprine (FLEXERIL) 10 MG tablet Take 1 tablet (10 mg total) by mouth 2 (two) times daily as needed for muscle spasms.  20 tablet 0   Galcanezumab-gnlm (EMGALITY) 120 MG/ML SOAJ Inject 1 pen. into the skin every 28 (twenty-eight) days. 3 mL 4   hydrocortisone cream 1 % Apply 1 application topically 2 (two) times daily as needed for itching. otc medication for itching     hydrOXYzine (VISTARIL) 25 MG capsule Take 25 mg by mouth 3 (three) times daily as needed for itching.      ibuprofen (ADVIL) 600 MG tablet Take 1 tablet (600 mg total) by mouth every 6 (six) hours as needed. 30 tablet 0   ipratropium-albuterol (DUONEB) 0.5-2.5 (3) MG/3ML SOLN Take 3 mLs by nebulization every 4 (four) hours as needed. 360 mL 0   ondansetron (ZOFRAN ODT) 4 MG disintegrating tablet Take 1-2 tablets (4-8 mg total) by mouth every 8 (eight) hours as needed for nausea or vomiting. 20 tablet 0   pantoprazole (PROTONIX) 40 MG tablet Take 30- 60 min before your first and last meals of the day 60 tablet 2   SUMAtriptan (IMITREX) 6 MG/0.5ML SOLN injection Inject 0.5 mLs (6 mg total) into the skin every 2 (two) hours as needed for migraine or headache. May repeat in 2 hours if headache persists or recurs.    No more than 4/week 6 mL 2   topiramate (TOPAMAX) 100 MG tablet Take 1 tablet (100 mg total) by mouth 2 (two) times daily. (Patient not taking: Reported on 05/15/2021) 30 tablet 11   No current facility-administered medications on file prior to visit.     The following portions of the patient's history were reviewed and updated  as appropriate: allergies, current medications, family history, past medical history, social history, past surgical history and problem list.  Review of Systems:  Other than those mentioned in HPI all ROS negative   Objective:  Physical Exam BP (!) 123/52   Pulse 89   Wt 147 lb (66.7 kg)   LMP 06/14/2021   BMI 23.73 kg/m  CONSTITUTIONAL: Well-developed, well-nourished female in no acute distress.  EYES: EOM intact, conjunctivae normal, no scleral icterus HEAD: Normocephalic, atraumatic ENT: External right  and left ear normal, oropharynx is clear and moist. CARDIOVASCULAR: Normal heart rate noted, regular rhythm. No cyanosis or edema. 2+ distal pulses.  RESPIRATORY: Clear to auscultation bilaterally. Effort and breath sounds normal, no problems with respiration noted. GASTROINTESTINAL:Soft, normal bowel sounds, no distention noted.  No tenderness, rebound or guarding.  GENITOURINARY: 2 small ulcerated lesions on right labia. No discharge or bleeding MUSCULOSKELETAL: Normal range of motion. No tenderness. SKIN: Skin is warm and dry. No rash noted. Not diaphoretic. No erythema. No pallor. [place]: Alert and oriented to person, place, and time. Normal reflexes, muscle tone, coordination. No cranial nerve deficit noted. PSYCHIATRIC: Normal mood and affect. Normal behavior. Normal judgment and thought content. HEM/LYMPH/IMMUNOLOGIC: left inguinal swelling   Labs and Imaging   Assessment & Plan:  Assessment: 1. HSV (herpes simplex virus) infection   2. Screen for STD (sexually transmitted disease)      Plans: Follow up with lab results Rx for valtrex x 2 (for current & recurrent episodes - instructed patient to take f/u valtrex with cycle or signs of outbreak) Return to office as needed  Jorje Guild, NP 06/19/2021 9:57 AM

## 2021-06-20 LAB — CERVICOVAGINAL ANCILLARY ONLY
Chlamydia: NEGATIVE
Comment: NEGATIVE
Comment: NEGATIVE
Comment: NORMAL
Neisseria Gonorrhea: NEGATIVE
Trichomonas: NEGATIVE

## 2021-06-20 LAB — HEPATITIS B SURFACE ANTIGEN: Hepatitis B Surface Ag: NEGATIVE

## 2021-06-20 LAB — HIV ANTIBODY (ROUTINE TESTING W REFLEX): HIV Screen 4th Generation wRfx: NONREACTIVE

## 2021-06-20 LAB — RPR: RPR Ser Ql: NONREACTIVE

## 2021-06-20 LAB — HCV AB W REFLEX TO QUANT PCR: HCV Ab: NONREACTIVE

## 2021-06-20 LAB — HCV INTERPRETATION

## 2021-06-21 LAB — HERPES SIMPLEX VIRUS CULTURE

## 2021-08-14 ENCOUNTER — Telehealth: Payer: Self-pay | Admitting: *Deleted

## 2021-08-14 NOTE — Telephone Encounter (Signed)
Submitted PA Emgality on CMM. Key: [id number]. Received instant approval: "PA Case: [id number], Status: Approved, Coverage Starts on: 08/14/2021 12:00:00 AM, Coverage Ends on: 08/14/2022 12:00:00 AM."

## 2021-09-18 ENCOUNTER — Ambulatory Visit: Payer: Medicaid Other | Admitting: Neurology

## 2021-09-18 ENCOUNTER — Encounter: Payer: Self-pay | Admitting: Neurology

## 2021-09-18 ENCOUNTER — Other Ambulatory Visit: Payer: Self-pay | Admitting: Neurology

## 2021-09-18 VITALS — BP 115/75 | HR 95 | Ht 66.0 in | Wt 146.0 lb

## 2021-09-18 DIAGNOSIS — M542 Cervicalgia: Secondary | ICD-10-CM | POA: Diagnosis not present

## 2021-09-18 DIAGNOSIS — G43701 Chronic migraine without aura, not intractable, with status migrainosus: Secondary | ICD-10-CM

## 2021-09-18 MED ORDER — TOPIRAMATE 100 MG PO TABS: 100.0000 mg | ORAL_TABLET | Freq: Two times a day (BID) | ORAL | 11 refills | Status: DC

## 2021-09-18 MED ORDER — SUMATRIPTAN SUCCINATE 6 MG/0.5ML ~~LOC~~ SOLN: 6.0000 mg | SUBCUTANEOUS | 2 refills | Status: DC | PRN

## 2021-09-18 NOTE — Progress Notes (Signed)
[facility]  PATIENT: Selena Haynes DOB: 1985-05-16  REFERRING DOCTOR OR PCP: Alona Bene, MD; Vergia Alberts, MD SOURCE: Patient, Notes from ED  _________________________________   HISTORICAL  CHIEF COMPLAINT:  Chief Complaint  Patient presents with   Follow-up    RM 2, alone. Last seen 05/15/21. Migraine f/u. Thought she was here for workman comp case for neck issues but advised Dr. Epimenio Foot does not handle these. She thinks she may have confused appt. Her medicaid lapsed and she has reapplied. Due for Emgality inj. No samples available in office today to be able to provide pt.     HISTORY OF PRESENT ILLNESS:  Selena Haynes is a 36 y.o. woman with headaches.  Update 09/18/2021: Emgality has helped migraines a lot but she is out due to losing insurance.    She reports no HA for 1st 3 weeks of each cycle but then a couple HA the last week.    She tolerates the injections well.  Topiramate had only helped her slightly   She is back to having migrainous headaches every day, sometimes a few a day.  They last 30-120 minutes.  She notes the right face looks weak when she looks in a mirror during a migraine.    Pain is intense.     Associated with nausea, photophobia and phonophobia.  Moving her head may make it worse but she becomes restless and shifts position frequently.   When a HA occurs she sometimes takes Motrin but it takes an hour to kick in.  Laying down helps more than an NSAID  She also has pain in the back of the head.    However, an occipital nerve block helped her x a few days only.     When she has a HA, she becomes very irritable.       Medications tried: Verapamil, muscle relaxants (cyclobenzaprine and tizanidine), Topamax and NSAIDs have not helped.     Imitrex injections have helped som but HA returns the next day.  Triptan pills help a little. Fresh air has not helped.   Emgality has helped.      She felt the occipital nerve block helped her HA's  for a couple weeks but now HA's have returned.   She had 4 over the last 7 days.  She is having 15-18/30 days .     Verapamil has not helpd her much. NSAIDs don't help.     She has had occasional episodic migraines x many years.  These seem to be coming more frequently these were throbbing with N/V and photophobia   Taking an NSAID and laying down would help.   Sleep would end the migraine.    HA History from 2022: Since September, she has had frequent headaches radiating from the right occiput and to the right eye.  Vision seems to be blurry when that happens.  The most intense pain is in the forehead.  When the pain is more intense she also notes symptoms in the right arm.   These seem different than her episodic migraines.   She get nausea and vomiting.   She has waken up with N/V with a headache some times, as well.    These headaches are occurring a couple times a day - about 1/2 or more at night.     She has photophobia and phonophobia.   She also notes pain in the upper cervical spine when  the headaches are intense.  MRI of the brain 09/30/2020 was normal.    MR angiogram of the neck and head 09/30/2020 was normal.  REVIEW OF SYSTEMS: Constitutional: No fevers, chills, sweats, or change in appetite Eyes: No visual changes, double vision, eye pain Ear, nose and throat: No hearing loss, ear pain, nasal congestion, sore throat Cardiovascular: No chest pain, palpitations Respiratory:  No shortness of breath at rest or with exertion.   No wheezes GastrointestinaI: No nausea, vomiting, diarrhea, abdominal pain, fecal incontinence Genitourinary:  No dysuria, urinary retention or frequency.  No nocturia. Musculoskeletal:  No neck pain, back pain Integumentary: No rash, pruritus, skin lesions Neurological: as above Psychiatric: No depression at this time.  No anxiety Endocrine: No palpitations, diaphoresis, change in appetite, change in weigh or increased thirst Hematologic/Lymphatic:  No  anemia, purpura, petechiae. Allergic/Immunologic: No itchy/runny eyes, nasal congestion, recent allergic reactions, rashes  ALLERGIES: No Known Allergies  HOME MEDICATIONS:  Current Outpatient Medications:    acetaminophen (TYLENOL) 325 MG tablet, Take 2 tablets (650 mg total) by mouth every 4 (four) hours as needed (for pain scale < 4)., Disp: 30 tablet, Rfl: 0   albuterol (VENTOLIN HFA) 108 (90 Base) MCG/ACT inhaler, Inhale 1-2 puffs into the lungs every 6 (six) hours as needed for wheezing or shortness of breath., Disp: 18 g, Rfl: 0   budesonide-formoterol (SYMBICORT) 160-4.5 MCG/ACT inhaler, Inhale 2 puffs into the lungs 2 (two) times daily., Disp: 10.2 g, Rfl: 0   cetirizine (ZYRTEC) 10 MG tablet, Take 1 tablet (10 mg total) by mouth daily., Disp: 30 tablet, Rfl: 12   cyclobenzaprine (FLEXERIL) 10 MG tablet, Take 1 tablet (10 mg total) by mouth 2 (two) times daily as needed for muscle spasms., Disp: 20 tablet, Rfl: 0   Galcanezumab-gnlm (EMGALITY) 120 MG/ML SOAJ, Inject 1 pen. into the skin every 28 (twenty-eight) days., Disp: 3 mL, Rfl: 4   hydrocortisone cream 1 %, Apply 1 application topically 2 (two) times daily as needed for itching. otc medication for itching, Disp: , Rfl:    hydrOXYzine (VISTARIL) 25 MG capsule, Take 25 mg by mouth 3 (three) times daily as needed for itching. , Disp: , Rfl:    ibuprofen (ADVIL) 600 MG tablet, Take 1 tablet (600 mg total) by mouth every 6 (six) hours as needed., Disp: 30 tablet, Rfl: 0   ipratropium-albuterol (DUONEB) 0.5-2.5 (3) MG/3ML SOLN, Take 3 mLs by nebulization every 4 (four) hours as needed., Disp: 360 mL, Rfl: 0   ondansetron (ZOFRAN ODT) 4 MG disintegrating tablet, Take 1-2 tablets (4-8 mg total) by mouth every 8 (eight) hours as needed for nausea or vomiting., Disp: 20 tablet, Rfl: 0   pantoprazole (PROTONIX) 40 MG tablet, Take 30- 60 min before your first and last meals of the day, Disp: 60 tablet, Rfl: 2   valACYclovir (VALTREX) 1000 MG  tablet, Take 1 tablet (1,000 mg total) by mouth 2 (two) times daily. 10 day dosing for current outbreak, Disp: 20 tablet, Rfl: 0   valACYclovir (VALTREX) 1000 MG tablet, Take 1 tablet (1,000 mg total) by mouth daily. Take for 5 days for recurrent outbreaks, Disp: 5 tablet, Rfl: 6   SUMAtriptan (IMITREX) 6 MG/0.5ML SOLN injection, Inject 0.5 mLs (6 mg total) into the skin every 2 (two) hours as needed for migraine or headache. May repeat in 2 hours if headache persists or recurs.    No more than 4/week, Disp: 6 mL, Rfl: 2   topiramate (TOPAMAX) 100 MG tablet, Take 1 tablet (100 mg total) by  mouth 2 (two) times daily., Disp: 30 tablet, Rfl: 11  PAST MEDICAL HISTORY: Past Medical History:  Diagnosis Date   Anxiety    Asthma    Back pain    Carpal tunnel syndrome    Hypertension    Post traumatic stress disorder (PTSD)    Postpartum depression     PAST SURGICAL HISTORY: Past Surgical History:  Procedure Laterality Date   CESAREAN SECTION      FAMILY HISTORY: Family History  Problem Relation Age of Onset   Diabetes Mother    Asthma Sister    Asthma Brother    Hypertension Maternal Aunt    Diabetes Maternal Uncle    Hypertension Maternal Grandmother    Diabetes Maternal Grandmother     SOCIAL HISTORY:  Social History   Socioeconomic History   Marital status: Single    Spouse name: Not on file   Number of children: 3   Years of education: Not on file   Highest education level: High school graduate  Occupational History   Not on file  Tobacco Use   Smoking status: Some Days    Packs/day: 0.50    Years: 21.00    Total pack years: 10.50    Types: Cigarettes    Start date: 01/21/1998   Smokeless tobacco: Former  Substance and Sexual Activity   Alcohol use: No   Drug use: Yes    Types: Marijuana    Comment: last used Marijuana in February 2018   Sexual activity: Yes    Birth control/protection: Injection  Other Topics Concern   Not on file  Social History Narrative    Lives w 3 children   Right handed   Caffeine: about 5 sodas/coffees in a month   Social Determinants of Health   Financial Resource Strain: Not on file  Food Insecurity: Not on file  Transportation Needs: Not on file  Physical Activity: Not on file  Stress: Not on file  Social Connections: Not on file  Intimate Partner Violence: Not on file     PHYSICAL EXAM  Vitals:   09/18/21 1042  BP: 115/75  Pulse: 95  Weight: 146 lb (66.2 kg)  Height: 5\' 6"  (1.676 m)    Body mass index is 23.57 kg/m.   General: The patient is well-developed and well-nourished and in no acute distress.  She has mild  tenderness over the right occiput.     HEENT:  Head is [place]/AT.  Sclera are anicteric.    Skin: Extremities are without rash or  edema.  Musculoskeletal:  Back is nontender  Neurologic Exam  Mental status: The patient is alert and oriented x 3 at the time of the examination. The patient has apparent normal recent and remote memory, with an apparently normal attention span and concentration ability.   Speech is normal.  Cranial nerves: Extraocular movements are full.  Facial strength s normal, cold is more intense on right and touch is reduced on righ face.  . No obvious hearing deficits are noted.  Motor:  Muscle bulk is normal.   Tone is normal. Strength is  5 / 5 in all 4 extremities.   Sensory: Sensory testing is intact to touch x 4   Coordination: Cerebellar testing reveals good finger-nose-finger and heel-to-shin bilaterally.  Gait and station: Station is normal.   Gait is normal.  Tandem is normal    Reflexes: Deep tendon reflexes are symmetric and normal bilaterally.        DIAGNOSTIC DATA (LABS, IMAGING,  TESTING) - I reviewed patient records, labs, notes, testing and imaging myself where available.  Lab Results  Component Value Date   WBC 9.9 09/30/2020   HGB 14.3 09/30/2020   HCT 43.3 09/30/2020   MCV 92.7 09/30/2020   PLT 328 09/30/2020      Component  Value Date/Time   NA 139 09/30/2020 1050   K 3.8 09/30/2020 1050   CL 104 09/30/2020 1050   CO2 25 09/30/2020 1050   GLUCOSE 82 09/30/2020 1050   BUN 11 09/30/2020 1050   CREATININE 0.80 09/30/2020 1050   CREATININE 0.74 02/26/2016 1433   CALCIUM 9.4 09/30/2020 1050   PROT 6.7 09/30/2020 1050   ALBUMIN 3.9 09/30/2020 1050   AST 15 09/30/2020 1050   ALT 11 09/30/2020 1050   ALKPHOS 44 09/30/2020 1050   BILITOT 0.5 09/30/2020 1050   GFRNONAA >60 09/30/2020 1050   GFRAA >60 04/22/2016 1111      ASSESSMENT AND PLAN  Chronic migraine w/o aura, not intractable, w stat migr  Neck pain   Emgality has helped a lot but she has been out due to losing insurance.    We do not have Emgality samples --- provided with2 months Ajovy provided (Exp [id number] and Lot [id number]) Rest as needed for migraine.   She will continue to take Imitrex injections when one of her worst headaches occur.   4.   Return in 3 months or sooner for new or worsening neurologic symptoms.     Riese Hellard A. Epimenio Foot, MD, [facility] 09/18/2021, 11:13 AM Certified in Neurology, Clinical Neurophysiology, Sleep Medicine and Neuroimaging  [facility] [address], [address] [place], [place] [address] [phone]

## 2021-11-07 ENCOUNTER — Other Ambulatory Visit: Payer: Self-pay | Admitting: Neurology

## 2021-11-07 ENCOUNTER — Other Ambulatory Visit: Payer: Self-pay | Admitting: Student

## 2021-11-07 DIAGNOSIS — B009 Herpesviral infection, unspecified: Secondary | ICD-10-CM

## 2021-11-20 ENCOUNTER — Encounter (HOSPITAL_COMMUNITY): Payer: Self-pay | Admitting: *Deleted

## 2021-11-20 ENCOUNTER — Ambulatory Visit (HOSPITAL_COMMUNITY)
Admission: EM | Admit: 2021-11-20 | Discharge: 2021-11-20 | Disposition: A | Discharge status: Home / Self Care | Payer: Medicaid Other | Attending: Emergency Medicine | Admitting: Emergency Medicine

## 2021-11-20 DIAGNOSIS — L02818 Cutaneous abscess of other sites: Secondary | ICD-10-CM | POA: Insufficient documentation

## 2021-11-20 DIAGNOSIS — N898 Other specified noninflammatory disorders of vagina: Secondary | ICD-10-CM | POA: Diagnosis not present

## 2021-11-20 LAB — POCT URINALYSIS DIPSTICK, ED / UC
Bilirubin Urine: NEGATIVE
Glucose, UA: NEGATIVE mg/dL
Hgb urine dipstick: NEGATIVE
Ketones, ur: NEGATIVE mg/dL
Leukocytes,Ua: NEGATIVE
Nitrite: NEGATIVE
Protein, ur: NEGATIVE mg/dL
Specific Gravity, Urine: 1.02 (ref 1.005–1.030)
Urobilinogen, UA: 1 mg/dL (ref 0.0–1.0)
pH: 7.5 (ref 5.0–8.0)

## 2021-11-20 MED ORDER — DOXYCYCLINE HYCLATE 100 MG PO CAPS: 100.0000 mg | ORAL_CAPSULE | Freq: Two times a day (BID) | ORAL | 0 refills | Status: AC

## 2021-11-20 NOTE — ED Provider Notes (Signed)
[facility]    CSN: [id number] Arrival date & time: 11/20/21  0841      History   Chief Complaint Chief Complaint  Patient presents with   Urinary Urgency    HPI Selena Haynes is a 36 y.o. female.  Presents with vaginal odor, few day history Urinary urgency, no dysuria. Has been drinking lots of water No fevers. Denies abdominal pain  "Bumps" on her vaginal area, very painful One of them popped and drained No history of abscess  LMP 10/24   Past Medical History:  Diagnosis Date   Anxiety    Asthma    Back pain    Carpal tunnel syndrome    Hypertension    Post traumatic stress disorder (PTSD)    Postpartum depression     Patient Active Problem List   Diagnosis Date Noted   Right sided numbness 05/15/2021   Chronic migraine w/o aura, not intractable, w stat migr 11/08/2020   Chronic cluster headache, not intractable 10/05/2020   Neck pain 10/05/2020   Occipital neuralgia of right side 10/05/2020   Cephalalgia 10/05/2020   Allergic rhinitis 10/26/2019   Cigarette smoker 10/26/2019   Healthcare maintenance 10/26/2019   Cough variant asthma 01/29/2018   Bronchitis with asthma, acute 01/09/2018   Thrush, oral 01/09/2018   Rhinitis, nonallergic, chronic 09/24/2016   GERD without esophagitis 09/24/2016   Carpal tunnel syndrome, right upper limb 05/22/2016   Anxiety disorder affecting pregnancy, antepartum 05/22/2016   Previous cesarean delivery affecting pregnancy, antepartum 02/26/2016    Past Surgical History:  Procedure Laterality Date   CESAREAN SECTION      OB History     Gravida  5   Para  3   Term  3   Preterm  0   AB  2   Living  3      SAB  2   IAB  0   Ectopic  0   Multiple  0   Live Births  3            Home Medications    Prior to Admission medications   Medication Sig Start Date End Date Taking? Authorizing Provider  albuterol (VENTOLIN HFA) 108 (90 Base) MCG/ACT inhaler Inhale 1-2 puffs into the  lungs every 6 (six) hours as needed for wheezing or shortness of breath. 10/18/19  Yes Wieters, Hallie C, PA-C  budesonide-formoterol (SYMBICORT) 160-4.5 MCG/ACT inhaler Inhale 2 puffs into the lungs 2 (two) times daily. 10/18/19  Yes Wieters, Hallie C, PA-C  cetirizine (ZYRTEC) 10 MG tablet Take 1 tablet (10 mg total) by mouth daily. 10/26/19  Yes Coral Ceo, NP  doxycycline (VIBRAMYCIN) 100 MG capsule Take 1 capsule (100 mg total) by mouth 2 (two) times daily for 5 days. 11/20/21 11/25/21 Yes Hikaru Delorenzo, PA-C  Galcanezumab-gnlm (EMGALITY) 120 MG/ML SOAJ Inject 1 pen. into the skin every 28 (twenty-eight) days. 05/15/21  Yes Sater, Pearletha Furl, MD  hydrocortisone cream 1 % Apply 1 application topically 2 (two) times daily as needed for itching. otc medication for itching   Yes [provider]  hydrOXYzine (VISTARIL) 25 MG capsule Take 25 mg by mouth 3 (three) times daily as needed for itching.    Yes [provider]  ibuprofen (ADVIL) 600 MG tablet Take 1 tablet (600 mg total) by mouth every 6 (six) hours as needed. 02/14/19  Yes Lamptey, Britta Mccreedy, MD  ipratropium-albuterol (DUONEB) 0.5-2.5 (3) MG/3ML SOLN Take 3 mLs by nebulization every 4 (four) hours as  needed. 10/18/19  Yes Wieters, Hallie C, PA-C  pantoprazole (PROTONIX) 40 MG tablet Take 30- 60 min before your first and last meals of the day 11/29/19  Yes Nyoka Cowden, MD  SUMAtriptan (IMITREX) 6 MG/0.5ML SOLN injection Inject 0.5 mLs (6 mg total) into the skin every 2 (two) hours as needed for migraine or headache. May repeat in 2 hours if headache persists or recurs.    No more than 4/week 09/18/21  Yes Sater, Pearletha Furl, MD  topiramate (TOPAMAX) 100 MG tablet Take 1 tablet (100 mg total) by mouth 2 (two) times daily. 09/18/21  Yes Sater, Pearletha Furl, MD    Family History Family History  Problem Relation Age of Onset   Diabetes Mother    Asthma Sister    Asthma Brother    Hypertension Maternal Aunt    Diabetes Maternal  Uncle    Hypertension Maternal Grandmother    Diabetes Maternal Grandmother     Social History Social History   Tobacco Use   Smoking status: Some Days    Packs/day: 0.50    Years: 21.00    Total pack years: 10.50    Types: Cigarettes    Start date: 01/21/1998   Smokeless tobacco: Former  Building services engineer Use: Never used  Substance Use Topics   Alcohol use: No   Drug use: Yes    Types: Marijuana    Comment: last used Marijuana in February 2018     Allergies   Patient has no known allergies.   Review of Systems Review of Systems  Per HPI  Physical Exam Triage Vital Signs ED Triage Vitals  Enc Vitals Group     BP 11/20/21 0959 121/71     Pulse Rate 11/20/21 0959 88     Resp 11/20/21 0959 18     Temp 11/20/21 0959 98.3 F (36.8 C)     Temp Source 11/20/21 0959 Oral     SpO2 11/20/21 0959 98 %     Weight --      Height --      Head Circumference --      Peak Flow --      Pain Score 11/20/21 0956 10     Pain Loc --      Pain Edu? --      Excl. in GC? --    No data found.  Updated Vital Signs BP 121/71 (BP Location: Right Arm)   Pulse 88   Temp 98.3 F (36.8 C) (Oral)   Resp 18   LMP 11/13/2021 (Approximate)   SpO2 98%    Physical Exam Vitals and nursing note reviewed. Exam conducted with a chaperone present Hulan Saas CMA).  Constitutional:      General: She is not in acute distress. Cardiovascular:     Rate and Rhythm: Normal rate and regular rhythm.     Pulses: Normal pulses.     Heart sounds: Normal heart sounds.  Pulmonary:     Effort: Pulmonary effort is normal.  Abdominal:     Tenderness: There is no abdominal tenderness. There is no guarding.  Genitourinary:    Comments: 3 small firm areas of abscess on mons pubis. Tender. No drainage noted but one has come to a head Skin:    General: Skin is warm and dry.  Neurological:     Mental Status: She is alert and oriented to person, place, and time.     UC Treatments / Results   Labs (all labs ordered  are listed, but only abnormal results are displayed) Labs Reviewed  POCT URINALYSIS DIPSTICK, ED / UC  CERVICOVAGINAL ANCILLARY ONLY    EKG  Radiology No results found.  Procedures Procedures   Medications Ordered in UC Medications - No data to display  Initial Impression / Assessment and Plan / UC Course  I have reviewed the triage vital signs and the nursing notes.  Pertinent labs & imaging results that were available during my care of the patient were reviewed by me and considered in my medical decision making (see chart for details).  Urinalysis negative.  Cytology swab pending. Will treat positive results as needed  Treat mons abscesses with doxy twice daily x 5 days Warm compress Instructed to return if symptoms persist and we can attempt drainage Return precautions discussed. Patient agrees to plan  Final Clinical Impressions(s) / UC Diagnoses   Final diagnoses:  Vaginal odor  Cutaneous abscess of other site     Discharge Instructions      We will call you if anything on your swab returns positive. Please abstain from sexual intercourse until your results return.  Take the medication as prescribed, with food. You can apply warm compress to reduce swelling. Please return if infection persists despite the antibiotic.      ED Prescriptions     Medication Sig Dispense Auth. Provider   doxycycline (VIBRAMYCIN) 100 MG capsule Take 1 capsule (100 mg total) by mouth 2 (two) times daily for 5 days. 10 capsule Delquan Poucher, Wells Guiles, PA-C      PDMP not reviewed this encounter.   Les Pou, [place] 11/20/21 1141

## 2021-11-20 NOTE — Discharge Instructions (Addendum)
We will call you if anything on your swab returns positive. Please abstain from sexual intercourse until your results return.  Take the medication as prescribed, with food. You can apply warm compress to reduce swelling. Please return if infection persists despite the antibiotic.

## 2021-11-20 NOTE — ED Triage Notes (Signed)
Pt states she has had a foul odor for a few days, she has a rash on her vagina, she has vaginal pain. She states that she has urine urgency.

## 2021-11-21 LAB — CERVICOVAGINAL ANCILLARY ONLY
Bacterial Vaginitis (gardnerella): NEGATIVE
Candida Glabrata: NEGATIVE
Candida Vaginitis: NEGATIVE
Chlamydia: NEGATIVE
Comment: NEGATIVE
Comment: NEGATIVE
Comment: NEGATIVE
Comment: NEGATIVE
Comment: NEGATIVE
Comment: NORMAL
Neisseria Gonorrhea: NEGATIVE
Trichomonas: NEGATIVE

## 2021-12-14 ENCOUNTER — Ambulatory Visit (HOSPITAL_COMMUNITY)
Admission: EM | Admit: 2021-12-14 | Discharge: 2021-12-14 | Disposition: A | Discharge status: Home / Self Care | Payer: Medicaid Other | Attending: Family Medicine | Admitting: Family Medicine

## 2021-12-14 DIAGNOSIS — N760 Acute vaginitis: Secondary | ICD-10-CM | POA: Diagnosis not present

## 2021-12-14 LAB — POC URINE PREG, ED: Preg Test, Ur: NEGATIVE

## 2021-12-14 MED ORDER — METRONIDAZOLE 500 MG PO TABS: 500.0000 mg | ORAL_TABLET | Freq: Two times a day (BID) | ORAL | 0 refills | Status: AC

## 2021-12-14 NOTE — Discharge Instructions (Signed)
The pregnancy test is negative  Take metronidazole 500 mg--1 tablet 2 times daily for 7 days.  Avoid drinking alcohol within 72 hours of taking this medication  The swab should result in about 2 days, and staff will notify you if anything else needs treatment.

## 2021-12-14 NOTE — ED Provider Notes (Addendum)
[facility]    CSN: [id number] Arrival date & time: 12/14/21  1415      History   Chief Complaint No chief complaint on file.   HPI Selena Haynes is a 36 y.o. female.   HPI Here for vaginal discharge and foul odor that she has noted for about 2 or 3 days.  She has maybe had a little abdominal cramping here and there.  No dysuria and no fever or chills.  Last menstrual cycle was about November 16.  That 1 was a little early.  She has not had intercourse in a couple of months.   She is concerned that maybe she left a tampon in too long, but she has not been able to retrieve 1.  Past Medical History:  Diagnosis Date   Anxiety    Asthma    Back pain    Carpal tunnel syndrome    Hypertension    Post traumatic stress disorder (PTSD)    Postpartum depression     Patient Active Problem List   Diagnosis Date Noted   Right sided numbness 05/15/2021   Chronic migraine w/o aura, not intractable, w stat migr 11/08/2020   Chronic cluster headache, not intractable 10/05/2020   Neck pain 10/05/2020   Occipital neuralgia of right side 10/05/2020   Cephalalgia 10/05/2020   Allergic rhinitis 10/26/2019   Cigarette smoker 10/26/2019   Healthcare maintenance 10/26/2019   Cough variant asthma 01/29/2018   Bronchitis with asthma, acute 01/09/2018   Thrush, oral 01/09/2018   Rhinitis, nonallergic, chronic 09/24/2016   GERD without esophagitis 09/24/2016   Carpal tunnel syndrome, right upper limb 05/22/2016   Anxiety disorder affecting pregnancy, antepartum 05/22/2016   Previous cesarean delivery affecting pregnancy, antepartum 02/26/2016    Past Surgical History:  Procedure Laterality Date   CESAREAN SECTION      OB History     Gravida  5   Para  3   Term  3   Preterm  0   AB  2   Living  3      SAB  2   IAB  0   Ectopic  0   Multiple  0   Live Births  3            Home Medications    Prior to Admission medications   Medication Sig  Start Date End Date Taking? Authorizing Provider  metroNIDAZOLE (FLAGYL) 500 MG tablet Take 1 tablet (500 mg total) by mouth 2 (two) times daily for 7 days. 12/14/21 12/21/21 Yes Bunnie Rehberg, Janace Aris, MD  albuterol (VENTOLIN HFA) 108 (90 Base) MCG/ACT inhaler Inhale 1-2 puffs into the lungs every 6 (six) hours as needed for wheezing or shortness of breath. 10/18/19   Wieters, Hallie C, PA-C  budesonide-formoterol (SYMBICORT) 160-4.5 MCG/ACT inhaler Inhale 2 puffs into the lungs 2 (two) times daily. 10/18/19   Wieters, Hallie C, PA-C  cetirizine (ZYRTEC) 10 MG tablet Take 1 tablet (10 mg total) by mouth daily. 10/26/19   Coral Ceo, NP  Galcanezumab-gnlm (EMGALITY) 120 MG/ML SOAJ Inject 1 pen. into the skin every 28 (twenty-eight) days. 05/15/21   Sater, Pearletha Furl, MD  hydrocortisone cream 1 % Apply 1 application topically 2 (two) times daily as needed for itching. otc medication for itching    [provider]  hydrOXYzine (VISTARIL) 25 MG capsule Take 25 mg by mouth 3 (three) times daily as needed for itching.     [provider]  ipratropium-albuterol (DUONEB) 0.5-2.5 (3)  MG/3ML SOLN Take 3 mLs by nebulization every 4 (four) hours as needed. 10/18/19   Wieters, Hallie C, PA-C  pantoprazole (PROTONIX) 40 MG tablet Take 30- 60 min before your first and last meals of the day 11/29/19   Nyoka Cowden, MD  SUMAtriptan (IMITREX) 6 MG/0.5ML SOLN injection Inject 0.5 mLs (6 mg total) into the skin every 2 (two) hours as needed for migraine or headache. May repeat in 2 hours if headache persists or recurs.    No more than 4/week 09/18/21   Sater, Pearletha Furl, MD  topiramate (TOPAMAX) 100 MG tablet Take 1 tablet (100 mg total) by mouth 2 (two) times daily. 09/18/21   Sater, Pearletha Furl, MD    Family History Family History  Problem Relation Age of Onset   Diabetes Mother    Asthma Sister    Asthma Brother    Hypertension Maternal Aunt    Diabetes Maternal Uncle    Hypertension Maternal  Grandmother    Diabetes Maternal Grandmother     Social History Social History   Tobacco Use   Smoking status: Some Days    Packs/day: 0.50    Years: 21.00    Total pack years: 10.50    Types: Cigarettes    Start date: 01/21/1998   Smokeless tobacco: Former  Building services engineer Use: Never used  Substance Use Topics   Alcohol use: No   Drug use: Yes    Types: Marijuana    Comment: last used Marijuana in February 2018     Allergies   Patient has no known allergies.   Review of Systems Review of Systems   Physical Exam Triage Vital Signs ED Triage Vitals [12/14/21 1532]  Enc Vitals Group     BP (!) 151/112     Pulse Rate 82     Resp 18     Temp 99 F (37.2 C)     Temp Source Oral     SpO2 98 %     Weight      Height      Head Circumference      Peak Flow      Pain Score      Pain Loc      Pain Edu?      Excl. in GC?    No data found.  Updated Vital Signs BP (!) 151/112 (BP Location: Left Arm)   Pulse 82   Temp 99 F (37.2 C) (Oral)   Resp 18   LMP 11/13/2021 (Approximate)   SpO2 98%   Visual Acuity Right Eye Distance:   Left Eye Distance:   Bilateral Distance:    Right Eye Near:   Left Eye Near:    Bilateral Near:     Physical Exam Vitals reviewed.  Constitutional:      General: She is not in acute distress.    Appearance: She is not ill-appearing, toxic-appearing or diaphoretic.  Cardiovascular:     Rate and Rhythm: Normal rate and regular rhythm.  Pulmonary:     Effort: Pulmonary effort is normal.     Breath sounds: Normal breath sounds.  Abdominal:     Palpations: Abdomen is soft.     Tenderness: There is no abdominal tenderness.  Genitourinary:    Comments: During speculum exam I cannot see any foreign body in the vaginal area.  There is some thick white discharge.  Cervical vaginal swab is done at the time of exam.  Bimanual exam is done and there  is no enlargement of the uterus or adnexa and I cannot still feel any foreign body  with my hand either. Skin:    Coloration: Skin is not jaundiced or pale.  Neurological:     Mental Status: She is alert and oriented to person, place, and time.  Psychiatric:        Behavior: Behavior normal.      UC Treatments / Results  Labs (all labs ordered are listed, but only abnormal results are displayed) Labs Reviewed  POC URINE PREG, ED  CERVICOVAGINAL ANCILLARY ONLY    EKG   Radiology No results found.  Procedures Procedures (including critical care time)  Medications Ordered in UC Medications - No data to display  Initial Impression / Assessment and Plan / UC Course  I have reviewed the triage vital signs and the nursing notes.  Pertinent labs & imaging results that were available during my care of the patient were reviewed by me and considered in my medical decision making (see chart for details).        UPT is negative.  I am going to treat for BV with her vaginal discharge and odor.  She is advised that the swab will result in 2 days and staff will notify her if anything else needs treating Final Clinical Impressions(s) / UC Diagnoses   Final diagnoses:  Acute vaginitis     Discharge Instructions      The pregnancy test is negative  Take metronidazole 500 mg--1 tablet 2 times daily for 7 days.  Avoid drinking alcohol within 72 hours of taking this medication  The swab should result in about 2 days, and staff will notify you if anything else needs treatment.     ED Prescriptions     Medication Sig Dispense Auth. Provider   metroNIDAZOLE (FLAGYL) 500 MG tablet Take 1 tablet (500 mg total) by mouth 2 (two) times daily for 7 days. 14 tablet Sylvanna Burggraf, Janace Aris, MD      PDMP not reviewed this encounter.   Zenia Resides, MD 12/14/21 1610    Zenia Resides, MD 12/14/21 [phone]

## 2021-12-14 NOTE — ED Triage Notes (Addendum)
Pt reports a foul odor coming from her vagina. Pt reports lots of vaginal discharge. She is concern a tampon is stuck in her vagina. Pt denies any recent sexual intercourse.Marland Kitchen

## 2021-12-17 LAB — CERVICOVAGINAL ANCILLARY ONLY
Bacterial Vaginitis (gardnerella): POSITIVE — AB
Candida Glabrata: NEGATIVE
Candida Vaginitis: NEGATIVE
Chlamydia: NEGATIVE
Comment: NEGATIVE
Comment: NEGATIVE
Comment: NEGATIVE
Comment: NEGATIVE
Comment: NEGATIVE
Comment: NORMAL
Neisseria Gonorrhea: NEGATIVE
Trichomonas: NEGATIVE

## 2022-01-25 ENCOUNTER — Encounter: Payer: Self-pay | Admitting: Lactation Services

## 2022-01-25 ENCOUNTER — Other Ambulatory Visit: Payer: Self-pay | Admitting: Lactation Services

## 2022-01-25 MED ORDER — VALACYCLOVIR HCL 500 MG PO TABS
500.0000 mg | ORAL_TABLET | Freq: Two times a day (BID) | ORAL | 99 refills | Status: DC
Start: 2022-01-25 — End: 2022-12-27

## 2022-01-25 NOTE — Progress Notes (Signed)
Pt requested refill on Valtrex. New order sent in per standing order with PRN refills for 1 year

## 2022-02-19 ENCOUNTER — Other Ambulatory Visit: Payer: Self-pay | Admitting: Neurology

## 2022-02-26 ENCOUNTER — Other Ambulatory Visit: Payer: Self-pay | Admitting: Neurology

## 2022-02-26 ENCOUNTER — Telehealth: Payer: Self-pay | Admitting: *Deleted

## 2022-02-26 NOTE — Telephone Encounter (Signed)
Please see the below PA needed.     Name from pharmacy: TOPIRAMATE 100 MG TABLET        Will file in chart as: topiramate (TOPAMAX) 100 MG tablet    Possible duplicate: Hover to review recent actions on this medication   Sig: TAKE 1 TABLET BY MOUTH TWICE A DAY   Disp: 180 tablet    Refills: 3   Start: 02/26/2022   Class: Normal   Non-formulary   Last ordered: 1 week ago (02/19/2022) by Britt Bottom, MD   Last refill: 02/26/2022   Rx #: [id number]   Pharmacy comment: Alternative Requested:PA.     To be filled at: [id number] - [place], [place]

## 2022-03-12 ENCOUNTER — Other Ambulatory Visit (HOSPITAL_COMMUNITY): Payer: Self-pay

## 2022-05-02 ENCOUNTER — Other Ambulatory Visit: Payer: Self-pay | Admitting: Orthopedic Surgery

## 2022-05-20 ENCOUNTER — Encounter: Payer: Self-pay | Admitting: Medical

## 2022-05-20 ENCOUNTER — Other Ambulatory Visit: Payer: Self-pay

## 2022-05-20 ENCOUNTER — Other Ambulatory Visit (HOSPITAL_COMMUNITY)
Admission: RE | Admit: 2022-05-20 | Discharge: 2022-05-20 | Disposition: A | Discharge status: Home / Self Care | Payer: Medicaid Other | Source: Ambulatory Visit | Attending: Family Medicine | Admitting: Family Medicine

## 2022-05-20 ENCOUNTER — Ambulatory Visit (INDEPENDENT_AMBULATORY_CARE_PROVIDER_SITE_OTHER): Payer: Medicaid Other | Admitting: Medical

## 2022-05-20 VITALS — BP 117/70 | HR 78 | Ht 66.0 in | Wt 164.9 lb

## 2022-05-20 DIAGNOSIS — N898 Other specified noninflammatory disorders of vagina: Secondary | ICD-10-CM | POA: Insufficient documentation

## 2022-05-20 DIAGNOSIS — B3731 Acute candidiasis of vulva and vagina: Secondary | ICD-10-CM

## 2022-05-20 DIAGNOSIS — N926 Irregular menstruation, unspecified: Secondary | ICD-10-CM

## 2022-05-20 DIAGNOSIS — Z124 Encounter for screening for malignant neoplasm of cervix: Secondary | ICD-10-CM | POA: Diagnosis present

## 2022-05-20 DIAGNOSIS — B009 Herpesviral infection, unspecified: Secondary | ICD-10-CM | POA: Diagnosis not present

## 2022-05-20 MED ORDER — FLUCONAZOLE 100 MG PO TABS
100.0000 mg | ORAL_TABLET | Freq: Every day | ORAL | 0 refills | Status: DC
Start: 2022-05-20 — End: 2022-08-13

## 2022-05-20 NOTE — Progress Notes (Signed)
History:  Ms. Selena Haynes is a 37 y.o. [id number] who presents to clinic today for pap smear. She is also concerned about vaginal discharge, irritation for the last week or so. She has also noted that for the last year her periods have been irregular. She is having periods about every 2 weeks, lasting 4 days each. She had intercourse last about 1 month ago. She just had a period last week. Last pap smear was normal 02/04/2019. She is concerned about the frequent periods because her periods also often trigger HSV outbreaks.    The following portions of the patient's history were reviewed and updated as appropriate: allergies, current medications, family history, past medical history, social history, past surgical history and problem list.  Review of Systems:  Review of Systems  Constitutional:  Negative for fever.  Gastrointestinal:  Negative for abdominal pain.  Genitourinary:  Negative for dysuria, frequency and urgency.       + vaginal discharge, itching, irritation  Neg -vaginal bleeding, pelvic pain      Objective:  Physical Exam BP 117/70   Pulse 78   Ht 5\' 6"  (1.676 m)   Wt 164 lb 14.4 oz (74.8 kg)   LMP 05/08/2022 (Approximate)   BMI 26.62 kg/m  Physical Exam Exam conducted with a chaperone present.  Constitutional:      Appearance: Normal appearance. She is not ill-appearing.  Cardiovascular:     Rate and Rhythm: Normal rate.  Pulmonary:     Effort: Pulmonary effort is normal.  Abdominal:     General: Abdomen is flat. There is no distension.     Palpations: Abdomen is soft. There is no mass.     Tenderness: There is no abdominal tenderness. There is no guarding or rebound.     Hernia: No hernia is present.  Genitourinary:    General: Normal vulva.     Vagina: Vaginal discharge (small, thick, yellow discharge notes adherent to the vaginal wall) present.     Cervix: No cervical motion tenderness, discharge, friability, lesion, erythema or cervical bleeding.     Uterus:  Not enlarged and not tender.      Adnexa:        Right: No mass or tenderness.         Left: No mass or tenderness.    Skin:    General: Skin is warm and dry.     Findings: No erythema.  Neurological:     Mental Status: She is alert and oriented to person, place, and time.  Psychiatric:        Mood and Affect: Mood normal.     Health Maintenance Due  Topic Date Due   COVID-19 Vaccine (1) Never done   PAP SMEAR-Modifier  02/03/2022    Labs, imaging and previous visits in Epic and Care Everywhere reviewed  Assessment & Plan:  1. Vaginal discharge - Cytology - PAP( [place]) - Cervicovaginal ancillary only( )  2. Irregular periods - Testosterone - Testosterone, free - Testosterone, % free - Sex hormone binding globulin - Prolactin - HgB A1c - TSH - FSH - Beta hCG quant (ref lab)  3. HSV (herpes simplex virus) infection  4. Vaginal yeast infection  - Rx Diflucan sent to patients pharmacy   Patient advised labs will be reported in MyChart and plan determined for irregular periods based on labs  Will consider [place] and/or endometrial biopsy if labs are normal  Return in about 1 year (around 05/20/2023) for Annual exam. Or PRN  sooner depending on labs or if symptoms persist or worsen   Kathlene Cote 05/20/2022 4:17 PM

## 2022-05-21 LAB — CERVICOVAGINAL ANCILLARY ONLY
Bacterial Vaginitis (gardnerella): NEGATIVE
Candida Glabrata: NEGATIVE
Candida Vaginitis: POSITIVE — AB
Chlamydia: NEGATIVE
Comment: NEGATIVE
Comment: NEGATIVE
Comment: NEGATIVE
Comment: NEGATIVE
Comment: NEGATIVE
Comment: NORMAL
Neisseria Gonorrhea: NEGATIVE
Trichomonas: NEGATIVE

## 2022-05-22 LAB — CYTOLOGY - PAP
Comment: NEGATIVE
Diagnosis: NEGATIVE
High risk HPV: NEGATIVE

## 2022-05-24 ENCOUNTER — Other Ambulatory Visit: Payer: Self-pay

## 2022-05-24 ENCOUNTER — Encounter (HOSPITAL_BASED_OUTPATIENT_CLINIC_OR_DEPARTMENT_OTHER): Payer: Self-pay | Admitting: Orthopedic Surgery

## 2022-05-27 ENCOUNTER — Telehealth: Payer: Self-pay

## 2022-05-27 LAB — SEX HORMONE BINDING GLOBULIN: Sex Hormone Binding: 124 nmol/L — ABNORMAL HIGH (ref 24.6–122.0)

## 2022-05-27 LAB — HEMOGLOBIN A1C
Est. average glucose Bld gHb Est-mCnc: 105 mg/dL
Hgb A1c MFr Bld: 5.3 % (ref 4.8–5.6)

## 2022-05-27 LAB — TESTOSTERONE: Testosterone: 18 ng/dL (ref 8–60)

## 2022-05-27 LAB — TSH: TSH: 2.19 u[IU]/mL (ref 0.450–4.500)

## 2022-05-27 LAB — FOLLICLE STIMULATING HORMONE: FSH: 4.3 m[IU]/mL

## 2022-05-27 LAB — TESTOSTERONE, FREE: Testosterone, Free: 0.6 pg/mL (ref 0.0–4.2)

## 2022-05-27 LAB — PROLACTIN: Prolactin: 12.3 ng/mL (ref 4.8–33.4)

## 2022-05-27 LAB — TESTOSTERONE, % FREE: Testosterone-% Free: 0.5 %

## 2022-05-27 LAB — BETA HCG QUANT (REF LAB): hCG Quant: <1 m[IU]/mL

## 2022-05-27 NOTE — Addendum Note (Signed)
Addended by: Marny Lowenstein on: 05/27/2022 11:24 AM   Modules accepted: Orders

## 2022-05-27 NOTE — Telephone Encounter (Addendum)
-----   Message from Marny Lowenstein, PA-C sent at 05/27/2022 11:24 AM EDT ----- Please schedule patient for pelvic US and endometrial biopsy with physician.   Thanks,  Raynelle Fanning   Scheduled U/S for June 3 at 1030 at [facility].  Pt notified.  Pt stated that she is having hand surgery this week.  Pt advised that we can schedule U/S in 4 weeks and then she can call the office to schedule provider appt after her recovery. Pt verbalized understanding.   Leonette Nutting  05/27/22

## 2022-05-29 NOTE — H&P (Signed)
Selena Haynes is an 37 y.o. female.   Chief Complaint: ligament injury HPI: 37 yo female states she injured right thumb in a fall.  MRI shows ulnar collateral ligament tear.  She wishes to proceed with operative repair vs reconstruction right thumb ulnar collateral ligament.  Allergies: No Known Allergies  Past Medical History:  Diagnosis Date   Anxiety    Asthma    Back pain    Carpal tunnel syndrome    Hypertension    Post traumatic stress disorder (PTSD)    Postpartum depression     Past Surgical History:  Procedure Laterality Date   CESAREAN SECTION      Family History: Family History  Problem Relation Age of Onset   Diabetes Mother    Asthma Sister    Asthma Brother    Hypertension Maternal Aunt    Diabetes Maternal Uncle    Hypertension Maternal Grandmother    Diabetes Maternal Grandmother     Social History:   reports that she has been smoking cigarettes. She started smoking about 24 years ago. She has a 10.50 pack-year smoking history. She has quit using smokeless tobacco. She reports current drug use. Drug: Marijuana. She reports that she does not drink alcohol.  Medications: No medications prior to admission.    No results found for this or any previous visit (from the past 48 hour(s)).  No results found.    Height 5\' 6"  (1.676 m), weight 74.4 kg.  General appearance: alert, cooperative, and appears stated age Head: Normocephalic, without obvious abnormality, atraumatic Neck: supple, symmetrical, trachea midline Extremities: Intact sensation and capillary refill all digits.  +epl/fpl/io.  No wounds.  Pulses: 2+ and symmetric Skin: Skin color, texture, turgor normal. No rashes or lesions Neurologic: Grossly normal Incision/Wound: none  Assessment/Plan Right thumb ulnar collateral ligament tear.  Plan repair vs reconstruction.  Non operative and operative treatment options have been discussed with the patient and patient wishes to proceed with  operative treatment. Risks, benefits, and alternatives of surgery have been discussed and the patient agrees with the plan of care.   Betha Loa 05/29/2022, 4:09 PM

## 2022-05-29 NOTE — Anesthesia Preprocedure Evaluation (Signed)
Anesthesia Evaluation  Patient identified by MRN, date of birth, ID band Patient awake    Reviewed: Allergy & Precautions, H&P , NPO status , Patient's Chart, lab work & pertinent test results  Airway Mallampati: I  TM Distance: >3 FB Neck ROM: Full    Dental no notable dental hx. (+) Teeth Intact, Dental Advisory Given   Pulmonary neg pulmonary ROS, asthma , Current Smoker   Pulmonary exam normal breath sounds clear to auscultation       Cardiovascular Exercise Tolerance: Good hypertension, negative cardio ROS Normal cardiovascular exam Rhythm:Regular Rate:Normal     Neuro/Psych  Headaches PSYCHIATRIC DISORDERS Anxiety Depression     Neuromuscular disease negative neurological ROS  negative psych ROS   GI/Hepatic negative GI ROS, Neg liver ROS,GERD  ,,  Endo/Other  negative endocrine ROS    Renal/GU negative Renal ROS  negative genitourinary   Musculoskeletal negative musculoskeletal ROS (+)    Abdominal   Peds negative pediatric ROS (+)  Hematology negative hematology ROS (+)   Anesthesia Other Findings   Reproductive/Obstetrics negative OB ROS                             Anesthesia Physical Anesthesia Plan  ASA: 2  Anesthesia Plan: General   Post-op Pain Management: Tylenol PO (pre-op)* and Celebrex PO (pre-op)*   Induction: Intravenous  PONV Risk Score and Plan: 3 and Ondansetron and Dexamethasone  Airway Management Planned: LMA  Additional Equipment: None  Intra-op Plan:   Post-operative Plan:   Informed Consent: I have reviewed the patients History and Physical, chart, labs and discussed the procedure including the risks, benefits and alternatives for the proposed anesthesia with the patient or authorized representative who has indicated his/her understanding and acceptance.       Plan Discussed with: CRNA, Surgeon and Anesthesiologist  Anesthesia Plan Comments:  ( )        Anesthesia Quick Evaluation

## 2022-05-29 NOTE — Progress Notes (Signed)
       Patient Instructions  The night before surgery:  No food after midnight. ONLY clear liquids after midnight  The day of surgery (if you do NOT have diabetes):  Drink ONE (1) Pre-Surgery Clear Ensure as directed.   This drink was given to you during your hospital  pre-op appointment visit. The pre-op nurse will instruct you on the time to drink the  Pre-Surgery Ensure depending on your surgery time. Finish the drink at the designated time by the pre-op nurse.  Nothing else to drink after completing the  Pre-Surgery Clear Ensure.  The day of surgery (if you have diabetes): Drink ONE (1) Gatorade 2 (G2) as directed. This drink was given to you during your hospital  pre-op appointment visit.  The pre-op nurse will instruct you on the time to drink the   Gatorade 2 (G2) depending on your surgery time. Color of the Gatorade may vary. Red is not allowed. Nothing else to drink after completing the  Gatorade 2 (G2).         If you have questions, please contact your surgeon's office.      Patient Instructions  The night before surgery:  No food after midnight. ONLY clear liquids after midnight  The day of surgery (if you do NOT have diabetes):  Drink ONE (1) Pre-Surgery Clear Ensure as directed.   This drink was given to you during your hospital  pre-op appointment visit. The pre-op nurse will instruct you on the time to drink the  Pre-Surgery Ensure depending on your surgery time. Finish the drink at the designated time by the pre-op nurse.  Nothing else to drink after completing the  Pre-Surgery Clear Ensure.  The day of surgery (if you have diabetes): Drink ONE (1) Gatorade 2 (G2) as directed. This drink was given to you during your hospital  pre-op appointment visit.  The pre-op nurse will instruct you on the time to drink the   Gatorade 2 (G2) depending on your surgery time. Color of the Gatorade may vary. Red is not allowed. Nothing else to drink after  completing the  Gatorade 2 (G2).         If you have questions, please contact your surgeon's office.   

## 2022-05-30 ENCOUNTER — Encounter (HOSPITAL_BASED_OUTPATIENT_CLINIC_OR_DEPARTMENT_OTHER): Payer: Self-pay | Admitting: Orthopedic Surgery

## 2022-05-30 ENCOUNTER — Ambulatory Visit (HOSPITAL_BASED_OUTPATIENT_CLINIC_OR_DEPARTMENT_OTHER): Admitting: Anesthesiology

## 2022-05-30 ENCOUNTER — Ambulatory Visit: Payer: Medicaid Other | Admitting: Obstetrics and Gynecology

## 2022-05-30 ENCOUNTER — Ambulatory Visit (HOSPITAL_BASED_OUTPATIENT_CLINIC_OR_DEPARTMENT_OTHER)

## 2022-05-30 ENCOUNTER — Encounter (HOSPITAL_BASED_OUTPATIENT_CLINIC_OR_DEPARTMENT_OTHER)
Admission: RE | Disposition: A | Discharge status: Home / Self Care | Payer: Self-pay | Source: Ambulatory Visit | Attending: Orthopedic Surgery

## 2022-05-30 ENCOUNTER — Ambulatory Visit (HOSPITAL_BASED_OUTPATIENT_CLINIC_OR_DEPARTMENT_OTHER)
Admission: RE | Admit: 2022-05-30 | Discharge: 2022-05-30 | Disposition: A | Discharge status: Home / Self Care | Source: Ambulatory Visit | Attending: Orthopedic Surgery | Admitting: Orthopedic Surgery

## 2022-05-30 ENCOUNTER — Other Ambulatory Visit: Payer: Self-pay

## 2022-05-30 DIAGNOSIS — I10 Essential (primary) hypertension: Secondary | ICD-10-CM | POA: Diagnosis not present

## 2022-05-30 DIAGNOSIS — Z01818 Encounter for other preprocedural examination: Secondary | ICD-10-CM

## 2022-05-30 DIAGNOSIS — J45909 Unspecified asthma, uncomplicated: Secondary | ICD-10-CM

## 2022-05-30 DIAGNOSIS — R519 Headache, unspecified: Secondary | ICD-10-CM | POA: Insufficient documentation

## 2022-05-30 DIAGNOSIS — F17200 Nicotine dependence, unspecified, uncomplicated: Secondary | ICD-10-CM

## 2022-05-30 DIAGNOSIS — S53441A Ulnar collateral ligament sprain of right elbow, initial encounter: Secondary | ICD-10-CM | POA: Insufficient documentation

## 2022-05-30 DIAGNOSIS — W19XXXA Unspecified fall, initial encounter: Secondary | ICD-10-CM | POA: Diagnosis not present

## 2022-05-30 DIAGNOSIS — F17210 Nicotine dependence, cigarettes, uncomplicated: Secondary | ICD-10-CM | POA: Diagnosis not present

## 2022-05-30 DIAGNOSIS — K219 Gastro-esophageal reflux disease without esophagitis: Secondary | ICD-10-CM | POA: Diagnosis not present

## 2022-05-30 DIAGNOSIS — S5331XA Traumatic rupture of right ulnar collateral ligament, initial encounter: Secondary | ICD-10-CM | POA: Diagnosis not present

## 2022-05-30 HISTORY — PX: ULNAR COLLATERAL LIGAMENT REPAIR: SHX6159

## 2022-05-30 LAB — POCT PREGNANCY, URINE: Preg Test, Ur: NEGATIVE

## 2022-05-30 SURGERY — REPAIR, LIGAMENT, ULNAR COLLATERAL
Anesthesia: General | Site: Thumb | Laterality: Right

## 2022-05-30 MED ORDER — ACETAMINOPHEN 325 MG PO TABS: 325.0000 mg | ORAL_TABLET | ORAL | Status: DC | PRN

## 2022-05-30 MED ORDER — LACTATED RINGERS IV SOLN: INTRAVENOUS | Status: DC

## 2022-05-30 MED ORDER — PROPOFOL 500 MG/50ML IV EMUL
INTRAVENOUS | Status: AC
  Filled 2022-05-30: qty 50

## 2022-05-30 MED ORDER — FENTANYL CITRATE (PF) 100 MCG/2ML IJ SOLN
INTRAMUSCULAR | Status: AC
  Filled 2022-05-30: qty 2

## 2022-05-30 MED ORDER — LIDOCAINE 2% (20 MG/ML) 5 ML SYRINGE
INTRAMUSCULAR | Status: AC
  Filled 2022-05-30: qty 5

## 2022-05-30 MED ORDER — CELECOXIB 200 MG PO CAPS
ORAL_CAPSULE | ORAL | Status: AC
  Filled 2022-05-30: qty 1

## 2022-05-30 MED ORDER — MEPERIDINE HCL 25 MG/ML IJ SOLN: 6.2500 mg | INTRAMUSCULAR | Status: DC | PRN

## 2022-05-30 MED ORDER — BUPIVACAINE HCL (PF) 0.25 % IJ SOLN
INTRAMUSCULAR | Status: DC | PRN
  Administered 2022-05-30: 9 mL

## 2022-05-30 MED ORDER — MIDAZOLAM HCL 5 MG/5ML IJ SOLN
INTRAMUSCULAR | Status: DC | PRN
  Administered 2022-05-30: 2 mg via INTRAVENOUS

## 2022-05-30 MED ORDER — HYDROCODONE-ACETAMINOPHEN 5-325 MG PO TABS: ORAL_TABLET | ORAL | 0 refills | Status: DC

## 2022-05-30 MED ORDER — BUPIVACAINE HCL (PF) 0.25 % IJ SOLN
INTRAMUSCULAR | Status: AC
  Filled 2022-05-30: qty 60

## 2022-05-30 MED ORDER — DEXMEDETOMIDINE HCL IN NACL 80 MCG/20ML IV SOLN
INTRAVENOUS | Status: DC | PRN
  Administered 2022-05-30 (×2): 8 ug via INTRAVENOUS

## 2022-05-30 MED ORDER — EPHEDRINE SULFATE (PRESSORS) 50 MG/ML IJ SOLN
INTRAMUSCULAR | Status: DC | PRN
  Administered 2022-05-30: 5 mg via INTRAVENOUS

## 2022-05-30 MED ORDER — OXYCODONE HCL 5 MG/5ML PO SOLN: 5.0000 mg | Freq: Once | ORAL | Status: DC | PRN

## 2022-05-30 MED ORDER — CELECOXIB 200 MG PO CAPS
200.0000 mg | ORAL_CAPSULE | Freq: Once | ORAL | Status: AC
  Administered 2022-05-30: 200 mg via ORAL

## 2022-05-30 MED ORDER — ACETAMINOPHEN 500 MG PO TABS
1000.0000 mg | ORAL_TABLET | Freq: Once | ORAL | Status: AC
  Administered 2022-05-30: 1000 mg via ORAL

## 2022-05-30 MED ORDER — ONDANSETRON HCL 4 MG/2ML IJ SOLN
INTRAMUSCULAR | Status: AC
  Filled 2022-05-30: qty 2

## 2022-05-30 MED ORDER — ONDANSETRON HCL 4 MG/2ML IJ SOLN: 4.0000 mg | Freq: Once | INTRAMUSCULAR | Status: DC | PRN

## 2022-05-30 MED ORDER — DEXAMETHASONE SODIUM PHOSPHATE 10 MG/ML IJ SOLN
INTRAMUSCULAR | Status: AC
  Filled 2022-05-30: qty 1

## 2022-05-30 MED ORDER — CEFAZOLIN SODIUM-DEXTROSE 2-4 GM/100ML-% IV SOLN
2.0000 g | INTRAVENOUS | Status: AC
  Administered 2022-05-30: 2 g via INTRAVENOUS

## 2022-05-30 MED ORDER — FENTANYL CITRATE (PF) 100 MCG/2ML IJ SOLN
INTRAMUSCULAR | Status: DC | PRN
  Administered 2022-05-30 (×2): 50 ug via INTRAVENOUS

## 2022-05-30 MED ORDER — DEXAMETHASONE SODIUM PHOSPHATE 10 MG/ML IJ SOLN
INTRAMUSCULAR | Status: DC | PRN
  Administered 2022-05-30: 10 mg via INTRAVENOUS

## 2022-05-30 MED ORDER — ONDANSETRON HCL 4 MG/2ML IJ SOLN
INTRAMUSCULAR | Status: DC | PRN
  Administered 2022-05-30: 4 mg via INTRAVENOUS

## 2022-05-30 MED ORDER — ACETAMINOPHEN 160 MG/5ML PO SOLN: 325.0000 mg | ORAL | Status: DC | PRN

## 2022-05-30 MED ORDER — DIPHENHYDRAMINE HCL 50 MG/ML IJ SOLN
12.5000 mg | Freq: Four times a day (QID) | INTRAMUSCULAR | Status: DC | PRN
  Administered 2022-05-30: 12.5 mg via INTRAVENOUS

## 2022-05-30 MED ORDER — CEFAZOLIN SODIUM-DEXTROSE 2-4 GM/100ML-% IV SOLN
INTRAVENOUS | Status: AC
  Filled 2022-05-30: qty 100

## 2022-05-30 MED ORDER — FENTANYL CITRATE (PF) 100 MCG/2ML IJ SOLN
25.0000 ug | INTRAMUSCULAR | Status: DC | PRN
  Administered 2022-05-30 (×2): 50 ug via INTRAVENOUS

## 2022-05-30 MED ORDER — DIPHENHYDRAMINE HCL 50 MG/ML IJ SOLN
INTRAMUSCULAR | Status: AC
  Filled 2022-05-30: qty 1

## 2022-05-30 MED ORDER — ACETAMINOPHEN 500 MG PO TABS
ORAL_TABLET | ORAL | Status: AC
  Filled 2022-05-30: qty 2

## 2022-05-30 MED ORDER — OXYCODONE HCL 5 MG PO TABS: 5.0000 mg | ORAL_TABLET | Freq: Once | ORAL | Status: DC | PRN

## 2022-05-30 MED ORDER — MIDAZOLAM HCL 2 MG/2ML IJ SOLN
INTRAMUSCULAR | Status: AC
  Filled 2022-05-30: qty 2

## 2022-05-30 SURGICAL SUPPLY — 75 items
ANCH SUT 2-0 MN NDL DRL PLSTR (Anchor) ×1 IMPLANT
ANCHOR JUGGERKNOT 1.0 1DR 2-0 (Anchor) IMPLANT
APL PRP STRL LF DISP 70% ISPRP (MISCELLANEOUS) ×1
BLADE MINI RND TIP GREEN BEAV (BLADE) ×1 IMPLANT
BLADE SURG 15 STRL LF DISP TIS (BLADE) ×2 IMPLANT
BLADE SURG 15 STRL SS (BLADE) ×2
BNDG CMPR 5X2 KNTD ELC UNQ LF (GAUZE/BANDAGES/DRESSINGS)
BNDG CMPR 5X3 KNIT ELC UNQ LF (GAUZE/BANDAGES/DRESSINGS)
BNDG CMPR 9X4 STRL LF SNTH (GAUZE/BANDAGES/DRESSINGS) ×1
BNDG ELASTIC 2INX 5YD STR LF (GAUZE/BANDAGES/DRESSINGS) IMPLANT
BNDG ELASTIC 3INX 5YD STR LF (GAUZE/BANDAGES/DRESSINGS) IMPLANT
BNDG ESMARK 4X9 LF (GAUZE/BANDAGES/DRESSINGS) ×1 IMPLANT
BNDG GAUZE DERMACEA FLUFF 4 (GAUZE/BANDAGES/DRESSINGS) ×1 IMPLANT
BNDG GZE DERMACEA 4 6PLY (GAUZE/BANDAGES/DRESSINGS) ×1
CATH ROBINSON RED A/P 8FR (CATHETERS) IMPLANT
CHLORAPREP W/TINT 26 (MISCELLANEOUS) ×1 IMPLANT
CORD BIPOLAR FORCEPS 12FT (ELECTRODE) ×1 IMPLANT
COVER BACK TABLE 60X90IN (DRAPES) ×1 IMPLANT
COVER MAYO STAND STRL (DRAPES) ×1 IMPLANT
CUFF TOURN SGL QUICK 18X4 (TOURNIQUET CUFF) ×1 IMPLANT
DRAPE EXTREMITY T 121X128X90 (DISPOSABLE) ×1 IMPLANT
DRAPE OEC MINIVIEW 54X84 (DRAPES) IMPLANT
DRAPE SURG 17X23 STRL (DRAPES) ×1 IMPLANT
GAUZE PAD ABD 8X10 STRL (GAUZE/BANDAGES/DRESSINGS) IMPLANT
GAUZE SPONGE 4X4 12PLY STRL (GAUZE/BANDAGES/DRESSINGS) ×1 IMPLANT
GAUZE XEROFORM 1X8 LF (GAUZE/BANDAGES/DRESSINGS) ×1 IMPLANT
GLOVE BIO SURGEON STRL SZ7.5 (GLOVE) ×1 IMPLANT
GLOVE BIOGEL PI IND STRL 8 (GLOVE) ×1 IMPLANT
GLOVE BIOGEL PI IND STRL 8.5 (GLOVE) ×1 IMPLANT
GLOVE SURG ORTHO 8.0 STRL STRW (GLOVE) IMPLANT
GOWN STRL REUS W/ TWL LRG LVL3 (GOWN DISPOSABLE) ×1 IMPLANT
GOWN STRL REUS W/TWL LRG LVL3 (GOWN DISPOSABLE) ×1
GOWN STRL REUS W/TWL XL LVL3 (GOWN DISPOSABLE) ×1 IMPLANT
K-WIRE DBL .035X4 NSTRL (WIRE)
KWIRE DBL .035X4 NSTRL (WIRE) IMPLANT
NDL HYPO 22X1.5 SAFETY MO (MISCELLANEOUS) IMPLANT
NDL HYPO 25X1 1.5 SAFETY (NEEDLE) IMPLANT
NDL KEITH (NEEDLE) IMPLANT
NDL SAFETY ECLIP 18X1.5 (MISCELLANEOUS) IMPLANT
NEEDLE HYPO 22X1.5 SAFETY MO (MISCELLANEOUS) IMPLANT
NEEDLE HYPO 25X1 1.5 SAFETY (NEEDLE) IMPLANT
NEEDLE KEITH (NEEDLE) IMPLANT
NS IRRIG 1000ML POUR BTL (IV SOLUTION) ×1 IMPLANT
PACK BASIN DAY SURGERY FS (CUSTOM PROCEDURE TRAY) ×1 IMPLANT
PAD CAST 3X4 CTTN HI CHSV (CAST SUPPLIES) ×1 IMPLANT
PAD CAST 4YDX4 CTTN HI CHSV (CAST SUPPLIES) IMPLANT
PADDING CAST ABS COTTON 4X4 ST (CAST SUPPLIES) ×1 IMPLANT
PADDING CAST COTTON 3X4 STRL (CAST SUPPLIES) ×1
PADDING CAST COTTON 4X4 STRL (CAST SUPPLIES)
PASSER SUT SWANSON 36MM LOOP (INSTRUMENTS) IMPLANT
SLEEVE SCD COMPRESS KNEE MED (STOCKING) ×1 IMPLANT
SLING ARM FOAM STRAP LRG (SOFTGOODS) IMPLANT
SPIKE FLUID TRANSFER (MISCELLANEOUS) IMPLANT
SPLINT PLASTER CAST FAST 5X30 (CAST SUPPLIES) IMPLANT
SPLINT PLASTER CAST XFAST 3X15 (CAST SUPPLIES) IMPLANT
STOCKINETTE 4X48 STRL (DRAPES) ×1 IMPLANT
SUT CHROMIC 4 0 PS 2 18 (SUTURE) IMPLANT
SUT ETHIBOND 3-0 V-5 (SUTURE) IMPLANT
SUT ETHILON 3 0 PS 1 (SUTURE) IMPLANT
SUT ETHILON 4 0 PS 2 18 (SUTURE) IMPLANT
SUT FIBERWIRE 2-0 18 17.9 3/8 (SUTURE)
SUT FIBERWIRE 3-0 18 TAPR NDL (SUTURE)
SUT MERSILENE 2.0 SH NDLE (SUTURE) IMPLANT
SUT MERSILENE 4 0 P 3 (SUTURE) IMPLANT
SUT SILK 4 0 PS 2 (SUTURE) IMPLANT
SUT VIC AB 2-0 SH 27 (SUTURE)
SUT VIC AB 2-0 SH 27XBRD (SUTURE) IMPLANT
SUT VIC AB 4-0 PS2 18 (SUTURE) IMPLANT
SUT VICRYL 0 SH 27 (SUTURE) IMPLANT
SUTURE FIBERWR 2-0 18 17.9 3/8 (SUTURE) IMPLANT
SUTURE FIBERWR 3-0 18 TAPR NDL (SUTURE) IMPLANT
SYR BULB EAR ULCER 3OZ GRN STR (SYRINGE) ×1 IMPLANT
SYR CONTROL 10ML LL (SYRINGE) IMPLANT
TOWEL GREEN STERILE FF (TOWEL DISPOSABLE) ×2 IMPLANT
UNDERPAD 30X36 HEAVY ABSORB (UNDERPADS AND DIAPERS) ×1 IMPLANT

## 2022-05-30 NOTE — Anesthesia Procedure Notes (Signed)
Procedure Name: LMA Insertion Date/Time: 05/30/2022 8:43 AM  Performed by: Burna Cash, CRNAPre-anesthesia Checklist: Patient identified, Emergency Drugs available, Suction available and Patient being monitored Patient Re-evaluated:Patient Re-evaluated prior to induction Oxygen Delivery Method: Circle system utilized Preoxygenation: Pre-oxygenation with 100% oxygen Induction Type: IV induction Ventilation: Mask ventilation without difficulty LMA: LMA inserted LMA Size: 4.0 Number of attempts: 1 Airway Equipment and Method: Bite block Placement Confirmation: positive ETCO2 Tube secured with: Tape Dental Injury: Teeth and Oropharynx as per pre-operative assessment

## 2022-05-30 NOTE — Discharge Instructions (Addendum)
[facility] Instructions Hand Surgery  Wound Care: Keep your hand elevated above the level of your heart.  Do not allow it to dangle by your side.  Keep the dressing dry and do not remove it unless your doctor advises you to do so.  He will usually change it at the time of your post-op visit.  Moving your fingers is advised to stimulate circulation but will depend on the site of your surgery.  If you have a splint applied, your doctor will advise you regarding movement.  Activity: Do not drive or operate machinery today.  Rest today and then you may return to your normal activity and work as indicated by your physician.  Diet:  Drink liquids today or eat a light diet.  You may resume a regular diet tomorrow.    General expectations: Pain for two to three days. Fingers may become slightly swollen.  Call your doctor if any of the following occur: Severe pain not relieved by pain medication. Elevated temperature. Dressing soaked with blood. Inability to move fingers. White or bluish color to fingers.    Post Anesthesia Home Care Instructions  Activity: Get plenty of rest for the remainder of the day. A responsible individual must stay with you for 24 hours following the procedure.  For the next 24 hours, DO NOT: -Drive a car -Advertising copywriter -Drink alcoholic beverages -Take any medication unless instructed by your physician -Make any legal decisions or sign important papers.  Meals: Start with liquid foods such as gelatin or soup. Progress to regular foods as tolerated. Avoid greasy, spicy, heavy foods. If nausea and/or vomiting occur, drink only clear liquids until the nausea and/or vomiting subsides. Call your physician if vomiting continues.  Special Instructions/Symptoms: Your throat may feel dry or sore from the anesthesia or the breathing tube placed in your throat during surgery. If this causes discomfort, gargle with warm salt water. The discomfort should disappear  within 24 hours.  Can have tylenol after 1:45 pm if needed Can have ibuprofen after 3:45 pm if needed

## 2022-05-30 NOTE — Op Note (Signed)
I assisted Surgeon(s) and Role:    * Betha Loa, MD - Primary    * Cindee Salt, MD - Assisting on the Procedure(s): RIGHT THUMB REPAIR VERSUS RECONSTRUCTION ULNAR COLLATERAL LIGAMENT on 05/30/2022.  I provided assistance on this case as follows: Set up, approach, retraction for identification and opening of the joint with identification of the rupture of collateral ligament, preparation for repair, placement of the anchor and repair of the collateral ligament, closure of the wound and application of the dressing and splint.  Electronically signed by: Cindee Salt, MD Date: 05/30/2022 Time: 9:26 AM

## 2022-05-30 NOTE — Anesthesia Postprocedure Evaluation (Signed)
Anesthesia Post Note  Patient: VENORA HALL  Procedure(s) Performed: RIGHT THUMB REPAIR VERSUS RECONSTRUCTION ULNAR COLLATERAL LIGAMENT (Right: Thumb)     Patient location during evaluation: PACU Anesthesia Type: General Level of consciousness: awake and alert Pain management: pain level controlled Vital Signs Assessment: post-procedure vital signs reviewed and stable Respiratory status: spontaneous breathing, nonlabored ventilation, respiratory function stable and patient connected to nasal cannula oxygen Cardiovascular status: blood pressure returned to baseline and stable Postop Assessment: no apparent nausea or vomiting Anesthetic complications: no   No notable events documented.  Last Vitals:  Vitals:   05/30/22 1045 05/30/22 1100  BP: 107/68 111/70  Pulse: 61 67  Resp: 18 16  Temp:  (!) 36.3 C  SpO2: 94% 98%    Last Pain:  Vitals:   05/30/22 1100  TempSrc:   PainSc: 4                  Elizandro Laura

## 2022-05-30 NOTE — Op Note (Signed)
NAME: Selena Haynes MEDICAL RECORD NO: [id number] DATE OF BIRTH: 1985/09/14 FACILITY: [facility] LOCATION: [facility] PHYSICIAN: Tami Ribas, MD   OPERATIVE REPORT   DATE OF PROCEDURE: 05/30/22    PREOPERATIVE DIAGNOSIS: Right thumb ulnar collateral ligament tear   POSTOPERATIVE DIAGNOSIS: Right thumb ulnar collateral ligament tear   PROCEDURE: Right thumb repair of ulnar collateral ligament   SURGEON:  Betha Loa, M.D.   ASSISTANT: Cindee Salt, MD   ANESTHESIA:  General   INTRAVENOUS FLUIDS:  Per anesthesia flow sheet.   ESTIMATED BLOOD LOSS:  Minimal.   COMPLICATIONS:  None.   SPECIMENS:  none   TOURNIQUET TIME:    Total Tourniquet Time Documented: Upper Arm (Left) - 27 minutes Total: Upper Arm (Left) - 27 minutes    DISPOSITION:  Stable to PACU.   INDICATIONS: 37 year old female states she injured her right thumb in a fall.  MRI shows ulnar collateral ligament tear.  She wishes to proceed with right thumb ulnar collateral ligament repair versus reconstruction.  Risks, benefits and alternatives of surgery were discussed including the risks of blood loss, infection, damage to nerves, vessels, tendons, ligaments, bone for surgery, need for additional surgery, complications with wound healing, continued pain, stiffness.  She voiced understanding of these risks and elected to proceed.  OPERATIVE COURSE:  After being identified preoperatively by myself,  the patient and I agreed on the procedure and site of the procedure.  The surgical site was marked.  Surgical consent had been signed. Preoperative IV antibiotic prophylaxis was given. She was transferred to the operating room and placed on the operating table in supine position with the Right upper extremity on an arm board.  General anesthesia was induced by the anesthesiologist.  Right upper extremity was prepped and draped in normal sterile orthopedic fashion.  A surgical pause was performed between the  surgeons, anesthesia, and operating room staff and all were in agreement as to the patient, procedure, and site of procedure.  Tourniquet at the proximal aspect of the extremity was inflated to 250 mmHg after exsanguination of the arm with an Esmarch bandage.  Incision was made on the dorsum of the thumb over the MP joint.  This was carried into subcutaneous tissues by spreading technique.  Bipolar electrocautery is used to obtain hemostasis.  The proximal aspect of the adductor aponeurosis was identified.  The adductor aponeurosis was sharply incised.  The capsule was then sharply incised with a Beaver blade as well.  The ligament was identified.  It had good quality to it.  The bone was denuded at the footprint on the base of the proximal phalanx.  The bone was scuffed up using the curette.  A juggernaut suture anchor was used.  The guidepin was placed in the anchor then placed into the hole.  The suture was used to repair the ulnar collateral ligament to the footprint in a horizontal Actros fashion.  Good approximation of the ligament to the footprint was obtained.  There was better stability of the thumb with stressing of the ulnar collateral ligament after repair.  The wound was copiously irrigated with sterile saline.  The capsule was closed with 4-0 chromic suture in running fashion.  The adductor aponeurosis was repaired with a 4-0 Mersilene suture in a running fashion.  The skin was closed with 4-0 nylon in a horizontal mattress fashion.  Digital block was then performed with quarter percent plain Marcaine to aid in postoperative analgesia.  Wound was dressed with sterile  Xeroform 4 x 4's and wrapped with a Kerlix bandage.  Thumb spica splint was placed.  This was wrapped with Kerlix and Ace bandage.  The tourniquet was deflated at 27 minutes.  Fingertips were pink with brisk capillary refill after deflation of tourniquet.  The operative  drapes were broken down.  The patient was awoken from anesthesia  safely.  She was transferred back to the stretcher and taken to PACU in stable condition.  I will see her back in the office in 1 week for postoperative followup.  I will give her a prescription for Norco 5/325 1-2 tabs PO q6 hours prn pain, dispense # 20.   Betha Loa, MD Electronically signed, 05/30/22

## 2022-05-30 NOTE — Anesthesia Postprocedure Evaluation (Signed)
Anesthesia Post Note  Patient: Selena Haynes  Procedure(s) Performed: RIGHT THUMB REPAIR VERSUS RECONSTRUCTION ULNAR COLLATERAL LIGAMENT (Right: Thumb)     Patient location during evaluation: PACU Anesthesia Type: General Level of consciousness: awake and alert Pain management: pain level controlled Vital Signs Assessment: post-procedure vital signs reviewed and stable Respiratory status: spontaneous breathing, nonlabored ventilation, respiratory function stable and patient connected to nasal cannula oxygen Cardiovascular status: blood pressure returned to baseline and stable Postop Assessment: no apparent nausea or vomiting Anesthetic complications: no   No notable events documented.  Last Vitals:  Vitals:   05/30/22 1045 05/30/22 1100  BP: 107/68 111/70  Pulse: 61 67  Resp: 18 16  Temp:  (!) 36.3 C  SpO2: 94% 98%    Last Pain:  Vitals:   05/30/22 1100  TempSrc:   PainSc: 4                  Noa Constante      

## 2022-05-30 NOTE — Transfer of Care (Signed)
Immediate Anesthesia Transfer of Care Note  Patient: Selena Haynes  Procedure(s) Performed: RIGHT THUMB REPAIR VERSUS RECONSTRUCTION ULNAR COLLATERAL LIGAMENT (Right: Thumb)  Patient Location: PACU  Anesthesia Type:General  Level of Consciousness: sedated  Airway & Oxygen Therapy: Patient Spontanous Breathing and Patient connected to face mask oxygen  Post-op Assessment: Report given to RN and Post -op Vital signs reviewed and stable  Post vital signs: Reviewed and stable  Last Vitals:  Vitals Value Taken Time  BP 97/58 05/30/22 0930  Temp 36.3 C 05/30/22 0930  Pulse 61 05/30/22 0932  Resp 16 05/30/22 0932  SpO2 99 % 05/30/22 0932  Vitals shown include unvalidated device data.  Last Pain:  Vitals:   05/30/22 0746  TempSrc: Oral  PainSc: 0-No pain         Complications: No notable events documented.

## 2022-05-31 ENCOUNTER — Encounter (HOSPITAL_BASED_OUTPATIENT_CLINIC_OR_DEPARTMENT_OTHER): Payer: Self-pay | Admitting: Orthopedic Surgery

## 2022-06-17 ENCOUNTER — Other Ambulatory Visit: Payer: Self-pay | Admitting: Neurology

## 2022-06-19 NOTE — Telephone Encounter (Signed)
Pt last seen on 09/18/21 Follow up scheduled on 09/19/22

## 2022-06-24 ENCOUNTER — Ambulatory Visit (HOSPITAL_COMMUNITY): Admission: RE | Admit: 2022-06-24 | Payer: Medicaid Other | Source: Ambulatory Visit

## 2022-07-13 ENCOUNTER — Emergency Department (HOSPITAL_BASED_OUTPATIENT_CLINIC_OR_DEPARTMENT_OTHER)
Admission: EM | Admit: 2022-07-13 | Discharge: 2022-07-14 | Disposition: A | Discharge status: Home / Self Care | Payer: Medicaid Other | Attending: Emergency Medicine | Admitting: Emergency Medicine

## 2022-07-13 ENCOUNTER — Emergency Department (HOSPITAL_BASED_OUTPATIENT_CLINIC_OR_DEPARTMENT_OTHER): Payer: Medicaid Other

## 2022-07-13 ENCOUNTER — Encounter (HOSPITAL_BASED_OUTPATIENT_CLINIC_OR_DEPARTMENT_OTHER): Payer: Self-pay

## 2022-07-13 DIAGNOSIS — M546 Pain in thoracic spine: Secondary | ICD-10-CM | POA: Diagnosis not present

## 2022-07-13 DIAGNOSIS — M5126 Other intervertebral disc displacement, lumbar region: Secondary | ICD-10-CM | POA: Insufficient documentation

## 2022-07-13 DIAGNOSIS — M545 Low back pain, unspecified: Secondary | ICD-10-CM | POA: Diagnosis present

## 2022-07-13 DIAGNOSIS — M5136 Other intervertebral disc degeneration, lumbar region: Secondary | ICD-10-CM

## 2022-07-13 MED ORDER — DIAZEPAM 5 MG/ML IJ SOLN
2.5000 mg | Freq: Once | INTRAMUSCULAR | Status: AC
  Administered 2022-07-13: 2.5 mg via INTRAVENOUS
  Filled 2022-07-13: qty 2

## 2022-07-13 MED ORDER — METHOCARBAMOL 1000 MG/10ML IJ SOLN
1000.0000 mg | Freq: Once | INTRAMUSCULAR | Status: AC
  Administered 2022-07-13: 1000 mg via INTRAVENOUS

## 2022-07-13 MED ORDER — LACTATED RINGERS IV BOLUS
1000.0000 mL | Freq: Once | INTRAVENOUS | Status: AC
  Administered 2022-07-13: 1000 mL via INTRAVENOUS

## 2022-07-13 MED ORDER — METHOCARBAMOL 1000 MG/10ML IJ SOLN
1000.0000 mg | Freq: Once | INTRAMUSCULAR | Status: DC
  Filled 2022-07-13: qty 10

## 2022-07-13 MED ORDER — HYDROCODONE-ACETAMINOPHEN 5-325 MG PO TABS
1.0000 | ORAL_TABLET | Freq: Once | ORAL | Status: AC
  Administered 2022-07-13: 1 via ORAL
  Filled 2022-07-13: qty 1

## 2022-07-13 MED ORDER — FENTANYL CITRATE PF 50 MCG/ML IJ SOSY
50.0000 ug | PREFILLED_SYRINGE | Freq: Once | INTRAMUSCULAR | Status: AC
  Administered 2022-07-13: 50 ug via INTRAVENOUS
  Filled 2022-07-13: qty 1

## 2022-07-13 MED ORDER — METHYLPREDNISOLONE SODIUM SUCC 125 MG IJ SOLR
125.0000 mg | Freq: Once | INTRAMUSCULAR | Status: AC
  Administered 2022-07-13: 125 mg via INTRAVENOUS
  Filled 2022-07-13: qty 2

## 2022-07-13 NOTE — ED Triage Notes (Signed)
Pt c/o mid to lower back pain x 2 days, worsening since. Pt states she laid down for a nap yesterday and was unable to walk. Pt was seen at [facility] prior today, and told she was negative for UTI. Pt was unable to do XR at [facility] due to pain.

## 2022-07-13 NOTE — ED Notes (Signed)
Patient arrived with PTAR from [facility] ER sent here for MRI , patient reports persistent low back pain for several days /no injury .

## 2022-07-13 NOTE — ED Notes (Signed)
Non emergency called @22 :37

## 2022-07-13 NOTE — ED Provider Notes (Signed)
[place] EMERGENCY DEPARTMENT AT [facility] Provider Note   CSN: [id number] Arrival date & time: 07/13/22  1027     History  Chief Complaint  Patient presents with   Back Pain    Selena Haynes is a 37 y.o. female.  HPI 37 year old female presents today complaining of low back pain.  States it began 2 days ago.  She did not have any injury.  This is in the middle.  Painful to move and give her legs.  Does not radiate to her legs.  States that she has had some increased frequency of urination and thought she might have a urinary tract infection.  She has not had a loss of bowel control.  She denies numbness or tingling in her legs.  Does not have any history of cancer or prior fracture.  Seen in urgent care today had urinalysis obtained that was negative for blood and leukocytes.  Point-of-care beta-hCG was negative there.    Home Medications Prior to Admission medications   Medication Sig Start Date End Date Taking? Authorizing Provider  albuterol (VENTOLIN HFA) 108 (90 Base) MCG/ACT inhaler Inhale 1-2 puffs into the lungs every 6 (six) hours as needed for wheezing or shortness of breath. 10/18/19   Wieters, Hallie C, PA-C  budesonide-formoterol (SYMBICORT) 160-4.5 MCG/ACT inhaler Inhale 2 puffs into the lungs 2 (two) times daily. 10/18/19   Wieters, Hallie C, PA-C  cetirizine (ZYRTEC) 10 MG tablet Take 1 tablet (10 mg total) by mouth daily. 10/26/19   Coral Ceo, NP  ELDERBERRY PO Take by mouth.    [provider]  fluconazole (DIFLUCAN) 100 MG tablet Take 1 tablet (100 mg total) by mouth daily. 05/20/22   Marny Lowenstein, PA-C  Galcanezumab-gnlm (EMGALITY) 120 MG/ML SOAJ INJECT 1 PEN. INTO THE SKIN EVERY 28 (TWENTY-EIGHT) DAYS. 06/19/22   Sater, Pearletha Furl, MD  HYDROcodone-acetaminophen [facility]) 5-325 MG tablet 1-2 tabs po q6 hours prn pain 05/30/22   Betha Loa, MD  hydrocortisone cream 1 % Apply 1 application topically 2 (two) times daily as needed for itching.  otc medication for itching    [provider]  hydrOXYzine (VISTARIL) 25 MG capsule Take 25 mg by mouth 3 (three) times daily as needed for itching.     [provider]  ipratropium-albuterol (DUONEB) 0.5-2.5 (3) MG/3ML SOLN Take 3 mLs by nebulization every 4 (four) hours as needed. 10/18/19   Wieters, Hallie C, PA-C  Multiple Vitamins-Minerals (EMERGEN-C IMMUNE PO) Take by mouth.    [provider]  pantoprazole (PROTONIX) 40 MG tablet Take 30- 60 min before your first and last meals of the day 11/29/19   Nyoka Cowden, MD  SUMAtriptan (IMITREX) 6 MG/0.5ML SOLN injection Inject 0.5 mLs (6 mg total) into the skin every 2 (two) hours as needed for migraine or headache. May repeat in 2 hours if headache persists or recurs.    No more than 4/week 09/18/21   Sater, Pearletha Furl, MD  topiramate (TOPAMAX) 100 MG tablet TAKE 1 TABLET BY MOUTH TWICE A DAY 02/19/22   Sater, Pearletha Furl, MD  valACYclovir (VALTREX) 500 MG tablet Take 1 tablet (500 mg total) by mouth 2 (two) times daily. 01/25/22   Adam Phenix, MD      Allergies    Patient has no known allergies.    Review of Systems   Review of Systems  Physical Exam Updated Vital Signs BP 113/77   Pulse 64   Temp 97.9 F (36.6 C) (  Oral)   Resp 16   Ht 1.676 m (5\' 6" )   Wt 74.4 kg   LMP 07/06/2022   SpO2 100%   BMI 26.47 kg/m  Physical Exam Vitals and nursing note reviewed.  Constitutional:      General: She is not in acute distress.    Appearance: Normal appearance.  HENT:     Head: Normocephalic.     Right Ear: External ear normal.     Left Ear: External ear normal.     Nose: Nose normal.     Mouth/Throat:     Pharynx: Oropharynx is clear.  Eyes:     Extraocular Movements: Extraocular movements intact.     Pupils: Pupils are equal, round, and reactive to light.  Cardiovascular:     Rate and Rhythm: Normal rate and regular rhythm.     Pulses: Normal pulses.  Pulmonary:     Effort: Pulmonary effort is  normal.     Breath sounds: Normal breath sounds.  Abdominal:     General: Abdomen is flat. Bowel sounds are normal. There is no distension.     Palpations: Abdomen is soft.  Musculoskeletal:        General: No swelling. Normal range of motion.     Cervical back: Normal range of motion.  Skin:    General: Skin is warm and dry.     Capillary Refill: Capillary refill takes less than 2 seconds.  Neurological:     General: No focal deficit present.     Mental Status: She is alert.     Cranial Nerves: No cranial nerve deficit.     Sensory: No sensory deficit.     Motor: No weakness.     Coordination: Coordination normal.     Gait: Gait normal.     Deep Tendon Reflexes: Reflexes normal.  Psychiatric:        Mood and Affect: Mood normal.     ED Results / Procedures / Treatments   Labs (all labs ordered are listed, but only abnormal results are displayed) Labs Reviewed - No data to display  EKG None  Radiology No results found.  Procedures Procedures    Medications Ordered in ED Medications  lactated ringers bolus 1,000 mL (1,000 mLs Intravenous New Bag/Given 07/13/22 1445)  fentaNYL (SUBLIMAZE) injection 50 mcg (50 mcg Intravenous Given 07/13/22 1431)  methylPREDNISolone sodium succinate (SOLU-MEDROL) 125 mg/2 mL injection 125 mg (125 mg Intravenous Given 07/13/22 1432)  diazepam (VALIUM) injection 2.5 mg (2.5 mg Intravenous Given 07/13/22 1431)  methocarbamol (ROBAXIN) injection 1,000 mg (1,000 mg Intravenous Given 07/13/22 1450)    ED Course/ Medical Decision Making/ A&P                             Medical Decision Making Amount and/or Complexity of Data Reviewed Radiology: ordered.  Risk Prescription drug management.   37 yo female with acuteaatraumatic low back pain. No neurodeficit. Pain meds ordered CT pending Discussed care with Dr. Wallace Cullens who assumes care.        Final Clinical Impression(s) / ED Diagnoses Final diagnoses:  Acute midline low back  pain without sciatica    Rx / DC Orders ED Discharge Orders     None         Margarita Grizzle, MD 07/13/22 1550

## 2022-07-13 NOTE — ED Provider Notes (Signed)
3:44 PM Patient signed out to me by previous ED physician. Pt is a 37 yo female presenting for sever low back pain. Unable to ambulate without assistance. No hx of trauma.   Neurologically intact on exam Pain meds given.  CT lumbar spine pending.   Plan: DC when pain controlled and if CT stable.    Physical Exam  BP 113/77   Pulse 64   Temp 97.9 F (36.6 C) (Oral)   Resp 16   Ht 5\' 6"  (1.676 m)   Wt 74.4 kg   LMP 07/06/2022   SpO2 100%   BMI 26.47 kg/m   Physical Exam Vitals and nursing note reviewed.  Constitutional:      General: She is not in acute distress.    Appearance: She is well-developed.  HENT:     Head: Normocephalic and atraumatic.  Eyes:     Conjunctiva/sclera: Conjunctivae normal.  Cardiovascular:     Rate and Rhythm: Normal rate and regular rhythm.     Heart sounds: No murmur heard. Pulmonary:     Effort: Pulmonary effort is normal. No respiratory distress.     Breath sounds: Normal breath sounds.  Abdominal:     Palpations: Abdomen is soft.     Tenderness: There is no abdominal tenderness.  Musculoskeletal:        General: No swelling.     Cervical back: Neck supple.  Skin:    General: Skin is warm and dry.     Capillary Refill: Capillary refill takes less than 2 seconds.  Neurological:     Mental Status: She is alert.     Sensory: Sensation is intact.     Motor: Motor function is intact.  Psychiatric:        Mood and Affect: Mood normal.     Procedures  Procedures  ED Course / MDM    Medical Decision Making Amount and/or Complexity of Data Reviewed Radiology: ordered.  Risk Prescription drug management.   CT lumbar spine positive for: 1. No acute fracture or subluxation. 2. Straightening of the lumbar spine which may be secondary to patient positioning or muscle spasm. 3. Mild disc bulge and lateral recess stenosis at L4-L5 and L5-S1. 4. 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  Throughout patient's  emergency department visit today she revealed received 1 L IV fluids, fentanyl 50 mcg IV, Solu-Medrol 125 mg IV, Valium 2.5 mg IV, Robaxin 1000 mg, and Norco 5-325 mg without improvement of symptoms.  Patient still unable to walk at this time secondary to pain.  Patient recommended for transfer to [facility] for MRI to rule out cauda equina syndrome or any acute limb threatening etiologies of low back pain.       Edwin Dada P, DO 07/13/22 2309

## 2022-07-13 NOTE — ED Notes (Signed)
[facility] called for transport to ED to ED @20 :55. Wait time will be greater than 

## 2022-07-14 ENCOUNTER — Emergency Department (HOSPITAL_COMMUNITY): Payer: Medicaid Other

## 2022-07-14 MED ORDER — METHOCARBAMOL 500 MG PO TABS: 500.0000 mg | ORAL_TABLET | Freq: Two times a day (BID) | ORAL | 0 refills | Status: DC

## 2022-07-14 MED ORDER — PREDNISONE 10 MG (21) PO TBPK: ORAL_TABLET | Freq: Every day | ORAL | 0 refills | Status: DC

## 2022-07-14 NOTE — ED Notes (Signed)
Patient transported to MRI 

## 2022-07-14 NOTE — Discharge Instructions (Signed)
Take prior as soon as prescribed and complete the full course. Take Robaxin as prescribed as needed.  Do not drive operate machinery if taking this medication.  Recheck with PCP or follow-up with neurosurgery, referral provided.

## 2022-07-14 NOTE — ED Notes (Signed)
Patient verbalizes understanding of discharge instructions. Opportunity for questioning and answers were provided. Armband removed by staff, pt discharged from ED. Pt taken to ED waiting room via wheel chair.  

## 2022-07-14 NOTE — ED Provider Notes (Signed)
37 year old female transferred from [facility] for lumbar MRI for complaint of low back pain onset Thursday, worse Friday. Now unable to walk secondary to pain in her lower back. Reports falling down steps at work 1 year ago, unsure if related. No fevers, hx IVDA, no loss of bowel or bladder control, no saddle paresthesia.  Physical Exam  BP 107/65 (BP Location: Left Arm)   Pulse 75   Temp 98.3 F (36.8 C) (Oral)   Resp 12   Ht 5\' 6"  (1.676 m)   Wt 74.4 kg   LMP 07/06/2022   SpO2 100%   BMI 26.47 kg/m   Physical Exam  Procedures  Procedures  ED Course / MDM    Medical Decision Making Amount and/or Complexity of Data Reviewed Radiology: ordered.  Risk Prescription drug management.   MRI returns with slight bulge to L4-L5 disc as well as L5-S1.  Discussed results with patient.  Recommend steroid taper and Robaxin.  Referred to neurosurgery for follow-up or can recheck with PCP. MRI is negative for cauda equina or other acute surgical emergency.      Jeannie Fend, PA-C 07/14/22 0210    Marily Memos, MD 07/14/22 [phone]

## 2022-07-20 ENCOUNTER — Other Ambulatory Visit (HOSPITAL_COMMUNITY): Payer: Self-pay

## 2022-07-20 ENCOUNTER — Telehealth: Payer: Self-pay

## 2022-07-20 NOTE — Telephone Encounter (Signed)
Pharmacy Patient Advocate Encounter  Prior Authorization for Emgality 120MG /ML auto-injectors (migraine) has been APPROVED by [facility] [employer] from 07/20/2022 to 07/20/2023.   PA # PA Case: [id number]

## 2022-07-20 NOTE — Telephone Encounter (Signed)
Pharmacy Patient Advocate Encounter   Received notification from [place] [employer] that prior authorization for Emgality 120MG /ML auto-injectors (migraine)  is required/requested.   PA submitted to [facility] via CoverMyMeds Key or [facility]) confirmation # [id number] Status is pending

## 2022-08-06 ENCOUNTER — Other Ambulatory Visit (HOSPITAL_COMMUNITY): Payer: Self-pay

## 2022-08-07 ENCOUNTER — Ambulatory Visit: Payer: Medicaid Other | Admitting: Neurology

## 2022-08-07 ENCOUNTER — Encounter: Payer: Self-pay | Admitting: Neurology

## 2022-08-13 ENCOUNTER — Ambulatory Visit (HOSPITAL_COMMUNITY)
Admission: EM | Admit: 2022-08-13 | Discharge: 2022-08-13 | Disposition: A | Discharge status: Home / Self Care | Payer: Medicaid Other | Attending: Emergency Medicine | Admitting: Emergency Medicine

## 2022-08-13 ENCOUNTER — Encounter (HOSPITAL_COMMUNITY): Payer: Self-pay

## 2022-08-13 DIAGNOSIS — N898 Other specified noninflammatory disorders of vagina: Secondary | ICD-10-CM | POA: Diagnosis not present

## 2022-08-13 DIAGNOSIS — Z113 Encounter for screening for infections with a predominantly sexual mode of transmission: Secondary | ICD-10-CM | POA: Insufficient documentation

## 2022-08-13 LAB — POCT URINE PREGNANCY: Preg Test, Ur: NEGATIVE

## 2022-08-13 LAB — HIV ANTIBODY (ROUTINE TESTING W REFLEX): HIV Screen 4th Generation wRfx: NONREACTIVE

## 2022-08-13 NOTE — ED Triage Notes (Signed)
Pt presents to the office for vaginal discharge. Pt had a one night stand.

## 2022-08-13 NOTE — Discharge Instructions (Addendum)
We will call you if anything on your swab or blood work returns positive. You can also see these results on MyChart. Please abstain from sexual intercourse until your results return.  Please follow with your ob/gyn if needed

## 2022-08-13 NOTE — ED Triage Notes (Signed)
Pt would like blood testing for STI.

## 2022-08-13 NOTE — ED Provider Notes (Signed)
[facility]    CSN: [id number] Arrival date & time: 08/13/22  0801      History   Chief Complaint Chief Complaint  Patient presents with   Vaginal Discharge    HPI Selena Haynes is a 36 y.o. female.  Here with vaginal discharge and odor for 2 days Requesting STD testing after unprotected intercourse. She would like blood work as well No itching or rash, dysuria, urgency/frequency Denies abd pain or NVD  LMP 7/4  Past Medical History:  Diagnosis Date   Anxiety    Asthma    Back pain    Carpal tunnel syndrome    Hypertension    Post traumatic stress disorder (PTSD)    Postpartum depression     Patient Active Problem List   Diagnosis Date Noted   HSV (herpes simplex virus) infection 05/20/2022   Right sided numbness 05/15/2021   Chronic migraine w/o aura, not intractable, w stat migr 11/08/2020   Chronic cluster headache, not intractable 10/05/2020   Neck pain 10/05/2020   Occipital neuralgia of right side 10/05/2020   Cephalalgia 10/05/2020   Allergic rhinitis 10/26/2019   Cigarette smoker 10/26/2019   Cough variant asthma 01/29/2018   Bronchitis with asthma, acute 01/09/2018   Thrush, oral 01/09/2018   Rhinitis, nonallergic, chronic 09/24/2016   GERD without esophagitis 09/24/2016   Carpal tunnel syndrome, right upper limb 05/22/2016   Anxiety disorder affecting pregnancy, antepartum 05/22/2016   Previous cesarean delivery affecting pregnancy, antepartum 02/26/2016    Past Surgical History:  Procedure Laterality Date   CESAREAN SECTION     ULNAR COLLATERAL LIGAMENT REPAIR Right 05/30/2022   Procedure: RIGHT THUMB REPAIR;  Surgeon: Betha Loa, MD;  Location: [facility];  Service: Orthopedics;  Laterality: Right;  60 MIN    OB History     Gravida  5   Para  3   Term  3   Preterm  0   AB  2   Living  3      SAB  2   IAB  0   Ectopic  0   Multiple  0   Live Births  3            Home Medications     Prior to Admission medications   Medication Sig Start Date End Date Taking? Authorizing Provider  budesonide-formoterol (SYMBICORT) 160-4.5 MCG/ACT inhaler Inhale 2 puffs into the lungs 2 (two) times daily. 10/18/19  Yes Wieters, Hallie C, PA-C  cetirizine (ZYRTEC) 10 MG tablet Take 1 tablet (10 mg total) by mouth daily. 10/26/19  Yes Coral Ceo, NP  ELDERBERRY PO Take by mouth.   Yes [provider]  Galcanezumab-gnlm (EMGALITY) 120 MG/ML SOAJ INJECT 1 PEN. INTO THE SKIN EVERY 28 (TWENTY-EIGHT) DAYS. 06/19/22  Yes Sater, Pearletha Furl, MD  hydrOXYzine (VISTARIL) 25 MG capsule Take 25 mg by mouth 3 (three) times daily as needed for itching.    Yes [provider]  methocarbamol (ROBAXIN) 500 MG tablet Take 1 tablet (500 mg total) by mouth 2 (two) times daily. 07/14/22  Yes Jeannie Fend, PA-C  Multiple Vitamins-Minerals (EMERGEN-C IMMUNE PO) Take by mouth.   Yes [provider]  pantoprazole (PROTONIX) 40 MG tablet Take 30- 60 min before your first and last meals of the day 11/29/19  Yes Nyoka Cowden, MD  valACYclovir (VALTREX) 500 MG tablet Take 1 tablet (500 mg total) by mouth 2 (two) times daily. 01/25/22  Yes Adam Phenix,  MD  albuterol (VENTOLIN HFA) 108 (90 Base) MCG/ACT inhaler Inhale 1-2 puffs into the lungs every 6 (six) hours as needed for wheezing or shortness of breath. 10/18/19   Wieters, Hallie C, PA-C  HYDROcodone-acetaminophen (NORCO) 5-325 MG tablet 1-2 tabs po q6 hours prn pain 05/30/22   Betha Loa, MD  hydrocortisone cream 1 % Apply 1 application topically 2 (two) times daily as needed for itching. otc medication for itching    [provider]  ipratropium-albuterol (DUONEB) 0.5-2.5 (3) MG/3ML SOLN Take 3 mLs by nebulization every 4 (four) hours as needed. 10/18/19   Wieters, Hallie C, PA-C  predniSONE (STERAPRED UNI-PAK 21 TAB) 10 MG (21) TBPK tablet Take by mouth daily. Take 6 tabs by mouth daily  for 2 days, then 5 tabs for 2 days, then 4  tabs for 2 days, then 3 tabs for 2 days, 2 tabs for 2 days, then 1 tab by mouth daily for 2 days 07/14/22   Jeannie Fend, PA-C  SUMAtriptan (IMITREX) 6 MG/0.5ML SOLN injection Inject 0.5 mLs (6 mg total) into the skin every 2 (two) hours as needed for migraine or headache. May repeat in 2 hours if headache persists or recurs.    No more than 4/week 09/18/21   Sater, Pearletha Furl, MD  topiramate (TOPAMAX) 100 MG tablet TAKE 1 TABLET BY MOUTH TWICE A DAY 02/19/22   Sater, Pearletha Furl, MD    Family History Family History  Problem Relation Age of Onset   Diabetes Mother    Asthma Sister    Asthma Brother    Hypertension Maternal Aunt    Diabetes Maternal Uncle    Hypertension Maternal Grandmother    Diabetes Maternal Grandmother     Social History Social History   Tobacco Use   Smoking status: Some Days    Current packs/day: 0.50    Average packs/day: 0.5 packs/day for 24.6 years (12.3 ttl pk-yrs)    Types: Cigarettes    Start date: 01/21/1998   Smokeless tobacco: Former  Advertising account planner   Vaping status: Never Used  Substance Use Topics   Alcohol use: No   Drug use: Yes    Types: Marijuana    Comment: last used Marijuana in February 2018     Allergies   Patient has no known allergies.   Review of Systems Review of Systems  Genitourinary:  Positive for vaginal discharge.  As per HPI  Physical Exam Triage Vital Signs ED Triage Vitals [08/13/22 0838]  Encounter Vitals Group     BP 111/73     Systolic BP Percentile      Diastolic BP Percentile      Pulse Rate 82     Resp 16     Temp 98.5 F (36.9 C)     Temp Source Oral     SpO2 98 %     Weight      Height      Head Circumference      Peak Flow      Pain Score      Pain Loc      Pain Education      Exclude from Growth Chart    No data found.  Updated Vital Signs BP 111/73 (BP Location: Left Arm)   Pulse 82   Temp 98.5 F (36.9 C) (Oral)   Resp 16   LMP 07/06/2022   SpO2 98%    Physical Exam Vitals and  nursing note reviewed.  Constitutional:  Appearance: Normal appearance.  HENT:     Mouth/Throat:     Mouth: Mucous membranes are moist.     Pharynx: Oropharynx is clear.  Eyes:     Conjunctiva/sclera: Conjunctivae normal.  Cardiovascular:     Rate and Rhythm: Normal rate and regular rhythm.     Heart sounds: Normal heart sounds.  Pulmonary:     Effort: Pulmonary effort is normal. No respiratory distress.     Breath sounds: Normal breath sounds.  Abdominal:     General: Bowel sounds are normal.     Palpations: Abdomen is soft.     Tenderness: There is no abdominal tenderness. There is no right CVA tenderness, left CVA tenderness, guarding or rebound.  Musculoskeletal:        General: Normal range of motion.  Skin:    General: Skin is warm and dry.  Neurological:     Mental Status: She is alert and oriented to person, place, and time.     UC Treatments / Results  Labs (all labs ordered are listed, but only abnormal results are displayed) Labs Reviewed  HIV ANTIBODY (ROUTINE TESTING W REFLEX)  RPR  POCT URINE PREGNANCY  CERVICOVAGINAL ANCILLARY ONLY    EKG  Radiology No results found.  Procedures Procedures (including critical care time)  Medications Ordered in UC Medications - No data to display  Initial Impression / Assessment and Plan / UC Course  I have reviewed the triage vital signs and the nursing notes.  Pertinent labs & imaging results that were available during my care of the patient were reviewed by me and considered in my medical decision making (see chart for details).  Cytology swab, HIV/RPR pending. Treat positive result as indicated. Discussed safe sex practices. Return precautions and follow with ob/gyn if needed. Patient agrees to plan  Awaiting sample for UPT, providing patient with water  10AM UPT negative   Final Clinical Impressions(s) / UC Diagnoses   Final diagnoses:  Screen for STD (sexually transmitted disease)  Vaginal  discharge     Discharge Instructions      We will call you if anything on your swab or blood work returns positive. You can also see these results on MyChart. Please abstain from sexual intercourse until your results return.  Please follow with your ob/gyn if needed     ED Prescriptions   None    PDMP not reviewed this encounter.   Muaad Boehning, Lurena Joiner, [place] 08/13/22 1001

## 2022-08-14 ENCOUNTER — Telehealth: Payer: Self-pay | Admitting: Emergency Medicine

## 2022-08-14 LAB — CERVICOVAGINAL ANCILLARY ONLY
Bacterial Vaginitis (gardnerella): NEGATIVE
Candida Glabrata: NEGATIVE
Candida Vaginitis: POSITIVE — AB
Chlamydia: NEGATIVE
Comment: NEGATIVE
Comment: NEGATIVE
Comment: NEGATIVE
Comment: NEGATIVE
Comment: NEGATIVE
Comment: NORMAL
Neisseria Gonorrhea: NEGATIVE
Trichomonas: NEGATIVE

## 2022-08-14 LAB — RPR: RPR Ser Ql: NONREACTIVE

## 2022-08-14 MED ORDER — FLUCONAZOLE 150 MG PO TABS: 150.0000 mg | ORAL_TABLET | Freq: Once | ORAL | 0 refills | Status: AC

## 2022-09-18 NOTE — Progress Notes (Deleted)
[facility]  PATIENT: Selena Haynes DOB: Oct 22, 1985  REFERRING DOCTOR OR PCP: Alona Bene, MD; Vergia Alberts, MD SOURCE: Patient, Notes from ED  _________________________________   HISTORICAL  CHIEF COMPLAINT:  No chief complaint on file.   HISTORY OF PRESENT ILLNESS:  Selena Haynes is a 37 y.o. woman with headaches.  Prior visit 09/18/2021 with Dr. Epimenio Foot  Update 09/19/2022:    Emgality has helped migraines a lot but she is out due to losing insurance.    She reports no HA for 1st 3 weeks of each cycle but then a couple HA the last week.    She tolerates the injections well.  Topiramate had only helped her slightly   She is back to having migrainous headaches every day, sometimes a few a day.  They last 30-120 minutes.  She notes the right face looks weak when she looks in a mirror during a migraine.    Pain is intense.     Associated with nausea, photophobia and phonophobia.  Moving her head may make it worse but she becomes restless and shifts position frequently.   When a HA occurs she sometimes takes Motrin but it takes an hour to kick in.  Laying down helps more than an NSAID  She also has pain in the back of the head.    However, an occipital nerve block helped her x a few days only.     When she has a HA, she becomes very irritable.       Medications tried: Verapamil, muscle relaxants (cyclobenzaprine and tizanidine), Topamax and NSAIDs have not helped.     Imitrex injections have helped som but HA returns the next day.  Triptan pills help a little. Fresh air has not helped.   Emgality has helped.      She felt the occipital nerve block helped her HA's for a couple weeks but now HA's have returned.   She had 4 over the last 7 days.  She is having 15-18/30 days .     Verapamil has not helpd her much. NSAIDs don't help.     She has had occasional episodic migraines x many years.  These seem to be coming more frequently these were throbbing with N/V  and photophobia   Taking an NSAID and laying down would help.   Sleep would end the migraine.    HA History from 2022: Since September, she has had frequent headaches radiating from the right occiput and to the right eye.  Vision seems to be blurry when that happens.  The most intense pain is in the forehead.  When the pain is more intense she also notes symptoms in the right arm.   These seem different than her episodic migraines.   She get nausea and vomiting.   She has waken up with N/V with a headache some times, as well.    These headaches are occurring a couple times a day - about 1/2 or more at night.     She has photophobia and phonophobia.   She also notes pain in the upper cervical spine when  the headaches are intense.         MRI of the brain 09/30/2020 was normal.    MR angiogram of the neck and head 09/30/2020 was normal.  REVIEW OF SYSTEMS: Constitutional: No fevers, chills, sweats, or change in appetite Eyes: No visual changes, double vision, eye pain Ear, nose and throat: No hearing loss, ear pain, nasal congestion, sore  throat Cardiovascular: No chest pain, palpitations Respiratory:  No shortness of breath at rest or with exertion.   No wheezes GastrointestinaI: No nausea, vomiting, diarrhea, abdominal pain, fecal incontinence Genitourinary:  No dysuria, urinary retention or frequency.  No nocturia. Musculoskeletal:  No neck pain, back pain Integumentary: No rash, pruritus, skin lesions Neurological: as above Psychiatric: No depression at this time.  No anxiety Endocrine: No palpitations, diaphoresis, change in appetite, change in weigh or increased thirst Hematologic/Lymphatic:  No anemia, purpura, petechiae. Allergic/Immunologic: No itchy/runny eyes, nasal congestion, recent allergic reactions, rashes  ALLERGIES: No Known Allergies  HOME MEDICATIONS:  Current Outpatient Medications:    albuterol (VENTOLIN HFA) 108 (90 Base) MCG/ACT inhaler, Inhale 1-2 puffs into the  lungs every 6 (six) hours as needed for wheezing or shortness of breath., Disp: 18 g, Rfl: 0   budesonide-formoterol (SYMBICORT) 160-4.5 MCG/ACT inhaler, Inhale 2 puffs into the lungs 2 (two) times daily., Disp: 10.2 g, Rfl: 0   cetirizine (ZYRTEC) 10 MG tablet, Take 1 tablet (10 mg total) by mouth daily., Disp: 30 tablet, Rfl: 12   ELDERBERRY PO, Take by mouth., Disp: , Rfl:    Galcanezumab-gnlm (EMGALITY) 120 MG/ML SOAJ, INJECT 1 PEN. INTO THE SKIN EVERY 28 (TWENTY-EIGHT) DAYS., Disp: 3 mL, Rfl: 1   HYDROcodone-acetaminophen (NORCO) 5-325 MG tablet, 1-2 tabs po q6 hours prn pain, Disp: 20 tablet, Rfl: 0   hydrocortisone cream 1 %, Apply 1 application topically 2 (two) times daily as needed for itching. otc medication for itching, Disp: , Rfl:    hydrOXYzine (VISTARIL) 25 MG capsule, Take 25 mg by mouth 3 (three) times daily as needed for itching. , Disp: , Rfl:    ipratropium-albuterol (DUONEB) 0.5-2.5 (3) MG/3ML SOLN, Take 3 mLs by nebulization every 4 (four) hours as needed., Disp: 360 mL, Rfl: 0   methocarbamol (ROBAXIN) 500 MG tablet, Take 1 tablet (500 mg total) by mouth 2 (two) times daily., Disp: 20 tablet, Rfl: 0   Multiple Vitamins-Minerals (EMERGEN-C IMMUNE PO), Take by mouth., Disp: , Rfl:    pantoprazole (PROTONIX) 40 MG tablet, Take 30- 60 min before your first and last meals of the day, Disp: 60 tablet, Rfl: 2   predniSONE (STERAPRED UNI-PAK 21 TAB) 10 MG (21) TBPK tablet, Take by mouth daily. Take 6 tabs by mouth daily  for 2 days, then 5 tabs for 2 days, then 4 tabs for 2 days, then 3 tabs for 2 days, 2 tabs for 2 days, then 1 tab by mouth daily for 2 days, Disp: 42 tablet, Rfl: 0   SUMAtriptan (IMITREX) 6 MG/0.5ML SOLN injection, Inject 0.5 mLs (6 mg total) into the skin every 2 (two) hours as needed for migraine or headache. May repeat in 2 hours if headache persists or recurs.    No more than 4/week, Disp: 6 mL, Rfl: 2   topiramate (TOPAMAX) 100 MG tablet, TAKE 1 TABLET BY MOUTH  TWICE A DAY, Disp: 180 tablet, Rfl: 3   valACYclovir (VALTREX) 500 MG tablet, Take 1 tablet (500 mg total) by mouth 2 (two) times daily., Disp: 6 tablet, Rfl: PRN  PAST MEDICAL HISTORY: Past Medical History:  Diagnosis Date   Anxiety    Asthma    Back pain    Carpal tunnel syndrome    Hypertension    Post traumatic stress disorder (PTSD)    Postpartum depression     PAST SURGICAL HISTORY: Past Surgical History:  Procedure Laterality Date   CESAREAN SECTION     ULNAR  COLLATERAL LIGAMENT REPAIR Right 05/30/2022   Procedure: RIGHT THUMB REPAIR;  Surgeon: Betha Loa, MD;  Location: [facility];  Service: Orthopedics;  Laterality: Right;  60 MIN    FAMILY HISTORY: Family History  Problem Relation Age of Onset   Diabetes Mother    Asthma Sister    Asthma Brother    Hypertension Maternal Aunt    Diabetes Maternal Uncle    Hypertension Maternal Grandmother    Diabetes Maternal Grandmother     SOCIAL HISTORY:  Social History   Socioeconomic History   Marital status: Single    Spouse name: Not on file   Number of children: 3   Years of education: Not on file   Highest education level: High school graduate  Occupational History   Not on file  Tobacco Use   Smoking status: Some Days    Current packs/day: 0.50    Average packs/day: 0.5 packs/day for 24.7 years (12.3 ttl pk-yrs)    Types: Cigarettes    Start date: 01/21/1998   Smokeless tobacco: Former  Advertising account planner   Vaping status: Never Used  Substance and Sexual Activity   Alcohol use: No   Drug use: Yes    Types: Marijuana    Comment: last used Marijuana in February 2018   Sexual activity: Yes    Birth control/protection: None  Other Topics Concern   Not on file  Social History Narrative   Lives w 3 children   Right handed   Caffeine: about 5 sodas/coffees in a month   Social Determinants of Health   Financial Resource Strain: Not on file  Food Insecurity: Not on file  Transportation Needs:  Not on file  Physical Activity: Not on file  Stress: Not on file  Social Connections: Unknown (05/22/2021)   Received from [facility]   Social Network    Social Network: Not on file  Intimate Partner Violence: Unknown (05/22/2021)   Received from [facility]   HITS    Physically Hurt: Not on file    Insult or Talk Down To: Not on file    Threaten Physical Harm: Not on file    Scream or Curse: Not on file     PHYSICAL EXAM  There were no vitals filed for this visit.   There is no height or weight on file to calculate BMI.   General: The patient is well-developed and well-nourished and in no acute distress.  She has mild  tenderness over the right occiput.     HEENT:  Head is [place]/AT.  Sclera are anicteric.    Skin: Extremities are without rash or  edema.  Musculoskeletal:  Back is nontender  Neurologic Exam  Mental status: The patient is alert and oriented x 3 at the time of the examination. The patient has apparent normal recent and remote memory, with an apparently normal attention span and concentration ability.   Speech is normal.  Cranial nerves: Extraocular movements are full.  Facial strength s normal, cold is more intense on right and touch is reduced on righ face.  . No obvious hearing deficits are noted.  Motor:  Muscle bulk is normal.   Tone is normal. Strength is  5 / 5 in all 4 extremities.   Sensory: Sensory testing is intact to touch x 4   Coordination: Cerebellar testing reveals good finger-nose-finger and heel-to-shin bilaterally.  Gait and station: Station is normal.   Gait is normal.  Tandem is normal    Reflexes: Deep  tendon reflexes are symmetric and normal bilaterally.        DIAGNOSTIC DATA (LABS, IMAGING, TESTING) - I reviewed patient records, labs, notes, testing and imaging myself where available.  Lab Results  Component Value Date   WBC 9.9 09/30/2020   HGB 14.3 09/30/2020   HCT 43.3 09/30/2020   MCV 92.7 09/30/2020   PLT 328  09/30/2020      Component Value Date/Time   NA 139 09/30/2020 1050   K 3.8 09/30/2020 1050   CL 104 09/30/2020 1050   CO2 25 09/30/2020 1050   GLUCOSE 82 09/30/2020 1050   BUN 11 09/30/2020 1050   CREATININE 0.80 09/30/2020 1050   CREATININE 0.74 02/26/2016 1433   CALCIUM 9.4 09/30/2020 1050   PROT 6.7 09/30/2020 1050   ALBUMIN 3.9 09/30/2020 1050   AST 15 09/30/2020 1050   ALT 11 09/30/2020 1050   ALKPHOS 44 09/30/2020 1050   BILITOT 0.5 09/30/2020 1050   GFRNONAA >60 09/30/2020 1050   GFRAA >60 04/22/2016 1111      ASSESSMENT AND PLAN  No diagnosis found.   Emgality has helped a lot but she has been out due to losing insurance.    We do not have Emgality samples --- provided with2 months Ajovy provided (Exp [id number] and Lot [id number]) Rest as needed for migraine.   She will continue to take Imitrex injections when one of her worst headaches occur.   4.   Return in 3 months or sooner for new or worsening neurologic symptoms.        I spent *** minutes of face-to-face and non-face-to-face time with patient.  This included previsit chart review, lab review, study review, order entry, electronic health record documentation, patient education and discussion regarding above diagnoses and treatment plan and answered all other questions to patient's satisfaction  Ihor Austin, [facility]  [facility] [address] [place], [place] [address]  Phone [phone] Fax [phone] Note: This document was prepared with digital dictation and possible smart phrase technology. Any transcriptional errors that result from this process are unintentional.

## 2022-09-19 ENCOUNTER — Ambulatory Visit: Payer: Medicaid Other | Admitting: Adult Health

## 2022-09-19 ENCOUNTER — Encounter: Payer: Self-pay | Admitting: Adult Health

## 2022-12-17 NOTE — Progress Notes (Unsigned)
[facility]  PATIENT: Selena Haynes DOB: [birth year]  REFERRING DOCTOR OR PCP: Alona Bene, MD; Vergia Alberts, MD SOURCE: Patient, Notes from ED  _________________________________   HISTORICAL  CHIEF COMPLAINT:  No chief complaint on file.   HISTORY OF PRESENT ILLNESS:  Selena Haynes is a 37 y.o. woman with headaches.  Update 09/18/2021: Returns for overdue follow-up visit.  Prior visit with Dr. Epimenio Foot on 09/18/2021.  She was able to restart Emgality injections in June, *** Also continues on topiramate 100 mg twice daily. Uses sumatriptan injection ***  Emgality has helped migraines a lot but she is out due to losing insurance.    She reports no HA for 1st 3 weeks of each cycle but then a couple HA the last week.    She tolerates the injections well.  Topiramate had only helped her slightly   She is back to having migrainous headaches every day, sometimes a few a day.  They last 30-120 minutes.  She notes the right face looks weak when she looks in a mirror during a migraine.    Pain is intense.     Associated with nausea, photophobia and phonophobia.  Moving her head may make it worse but she becomes restless and shifts position frequently.   When a HA occurs she sometimes takes Motrin but it takes an hour to kick in.  Laying down helps more than an NSAID  She also has pain in the back of the head.    However, an occipital nerve block helped her x a few days only.     When she has a HA, she becomes very irritable.       Medications tried: Verapamil, muscle relaxants (cyclobenzaprine and tizanidine), Topamax and NSAIDs have not helped.     Imitrex injections have helped som but HA returns the next day.  Triptan pills help a little. Fresh air has not helped.   Emgality has helped.      She felt the occipital nerve block helped her HA's for a couple weeks but now HA's have returned.   She had 4 over the last 7 days.  She is having 15-18/30 days .      Verapamil has not helpd her much. NSAIDs don't help.     She has had occasional episodic migraines x many years.  These seem to be coming more frequently these were throbbing with N/V and photophobia   Taking an NSAID and laying down would help.   Sleep would end the migraine.    HA History from 2022: Since September, she has had frequent headaches radiating from the right occiput and to the right eye.  Vision seems to be blurry when that happens.  The most intense pain is in the forehead.  When the pain is more intense she also notes symptoms in the right arm.   These seem different than her episodic migraines.   She get nausea and vomiting.   She has waken up with N/V with a headache some times, as well.    These headaches are occurring a couple times a day - about 1/2 or more at night.     She has photophobia and phonophobia.   She also notes pain in the upper cervical spine when  the headaches are intense.         MRI of the brain 09/30/2020 was normal.    MR angiogram of the neck and head 09/30/2020 was normal.  REVIEW OF SYSTEMS: Constitutional: No  fevers, chills, sweats, or change in appetite Eyes: No visual changes, double vision, eye pain Ear, nose and throat: No hearing loss, ear pain, nasal congestion, sore throat Cardiovascular: No chest pain, palpitations Respiratory:  No shortness of breath at rest or with exertion.   No wheezes GastrointestinaI: No nausea, vomiting, diarrhea, abdominal pain, fecal incontinence Genitourinary:  No dysuria, urinary retention or frequency.  No nocturia. Musculoskeletal:  No neck pain, back pain Integumentary: No rash, pruritus, skin lesions Neurological: as above Psychiatric: No depression at this time.  No anxiety Endocrine: No palpitations, diaphoresis, change in appetite, change in weigh or increased thirst Hematologic/Lymphatic:  No anemia, purpura, petechiae. Allergic/Immunologic: No itchy/runny eyes, nasal congestion, recent allergic reactions,  rashes  ALLERGIES: No Known Allergies  HOME MEDICATIONS:  Current Outpatient Medications:    albuterol (VENTOLIN HFA) 108 (90 Base) MCG/ACT inhaler, Inhale 1-2 puffs into the lungs every 6 (six) hours as needed for wheezing or shortness of breath., Disp: 18 g, Rfl: 0   budesonide-formoterol (SYMBICORT) 160-4.5 MCG/ACT inhaler, Inhale 2 puffs into the lungs 2 (two) times daily., Disp: 10.2 g, Rfl: 0   cetirizine (ZYRTEC) 10 MG tablet, Take 1 tablet (10 mg total) by mouth daily., Disp: 30 tablet, Rfl: 12   ELDERBERRY PO, Take by mouth., Disp: , Rfl:    Galcanezumab-gnlm (EMGALITY) 120 MG/ML SOAJ, INJECT 1 PEN. INTO THE SKIN EVERY 28 (TWENTY-EIGHT) DAYS., Disp: 3 mL, Rfl: 1   HYDROcodone-acetaminophen (NORCO) 5-325 MG tablet, 1-2 tabs po q6 hours prn pain, Disp: 20 tablet, Rfl: 0   hydrocortisone cream 1 %, Apply 1 application topically 2 (two) times daily as needed for itching. otc medication for itching, Disp: , Rfl:    hydrOXYzine (VISTARIL) 25 MG capsule, Take 25 mg by mouth 3 (three) times daily as needed for itching. , Disp: , Rfl:    ipratropium-albuterol (DUONEB) 0.5-2.5 (3) MG/3ML SOLN, Take 3 mLs by nebulization every 4 (four) hours as needed., Disp: 360 mL, Rfl: 0   methocarbamol (ROBAXIN) 500 MG tablet, Take 1 tablet (500 mg total) by mouth 2 (two) times daily., Disp: 20 tablet, Rfl: 0   Multiple Vitamins-Minerals (EMERGEN-C IMMUNE PO), Take by mouth., Disp: , Rfl:    pantoprazole (PROTONIX) 40 MG tablet, Take 30- 60 min before your first and last meals of the day, Disp: 60 tablet, Rfl: 2   predniSONE (STERAPRED UNI-PAK 21 TAB) 10 MG (21) TBPK tablet, Take by mouth daily. Take 6 tabs by mouth daily  for 2 days, then 5 tabs for 2 days, then 4 tabs for 2 days, then 3 tabs for 2 days, 2 tabs for 2 days, then 1 tab by mouth daily for 2 days, Disp: 42 tablet, Rfl: 0   SUMAtriptan (IMITREX) 6 MG/0.5ML SOLN injection, Inject 0.5 mLs (6 mg total) into the skin every 2 (two) hours as needed for  migraine or headache. May repeat in 2 hours if headache persists or recurs.    No more than 4/week, Disp: 6 mL, Rfl: 2   topiramate (TOPAMAX) 100 MG tablet, TAKE 1 TABLET BY MOUTH TWICE A DAY, Disp: 180 tablet, Rfl: 3   valACYclovir (VALTREX) 500 MG tablet, Take 1 tablet (500 mg total) by mouth 2 (two) times daily., Disp: 6 tablet, Rfl: PRN  PAST MEDICAL HISTORY: Past Medical History:  Diagnosis Date   Anxiety    Asthma    Back pain    Carpal tunnel syndrome    Hypertension    Post traumatic stress disorder (PTSD)  Postpartum depression     PAST SURGICAL HISTORY: Past Surgical History:  Procedure Laterality Date   CESAREAN SECTION     ULNAR COLLATERAL LIGAMENT REPAIR Right 05/30/2022   Procedure: RIGHT THUMB REPAIR;  Surgeon: Betha Loa, MD;  Location: [facility];  Service: Orthopedics;  Laterality: Right;  60 MIN    FAMILY HISTORY: Family History  Problem Relation Age of Onset   Diabetes Mother    Asthma Sister    Asthma Brother    Hypertension Maternal Aunt    Diabetes Maternal Uncle    Hypertension Maternal Grandmother    Diabetes Maternal Grandmother     SOCIAL HISTORY:  Social History   Socioeconomic History   Marital status: Single    Spouse name: Not on file   Number of children: 3   Years of education: Not on file   Highest education level: High school graduate  Occupational History   Not on file  Tobacco Use   Smoking status: Some Days    Current packs/day: 0.50    Average packs/day: 0.5 packs/day for 24.9 years (12.5 ttl pk-yrs)    Types: Cigarettes    Start date: 01/21/1998   Smokeless tobacco: Former  Advertising account planner   Vaping status: Never Used  Substance and Sexual Activity   Alcohol use: No   Drug use: Yes    Types: Marijuana    Comment: last used Marijuana in February 2018   Sexual activity: Yes    Birth control/protection: None  Other Topics Concern   Not on file  Social History Narrative   Lives w 3 children   Right handed    Caffeine: about 5 sodas/coffees in a month   Social Determinants of Health   Financial Resource Strain: Not on file  Food Insecurity: Not on file  Transportation Needs: Not on file  Physical Activity: Not on file  Stress: Not on file  Social Connections: Unknown (05/22/2021)   Received from [facility], [facility]   Social Network    Social Network: Not on file  Intimate Partner Violence: Unknown (05/22/2021)   Received from [facility], [facility]   HITS    Physically Hurt: Not on file    Insult or Talk Down To: Not on file    Threaten Physical Harm: Not on file    Scream or Curse: Not on file     PHYSICAL EXAM  There were no vitals filed for this visit.   There is no height or weight on file to calculate BMI.   General: The patient is well-developed and well-nourished and in no acute distress.  She has mild  tenderness over the right occiput.     HEENT:  Head is [place]/AT.  Sclera are anicteric.    Skin: Extremities are without rash or  edema.  Musculoskeletal:  Back is nontender  Neurologic Exam  Mental status: The patient is alert and oriented x 3 at the time of the examination. The patient has apparent normal recent and remote memory, with an apparently normal attention span and concentration ability.   Speech is normal.  Cranial nerves: Extraocular movements are full.  Facial strength s normal, cold is more intense on right and touch is reduced on righ face.  . No obvious hearing deficits are noted.  Motor:  Muscle bulk is normal.   Tone is normal. Strength is  5 / 5 in all 4 extremities.   Sensory: Sensory testing is intact to touch x 4   Coordination:  Cerebellar testing reveals good finger-nose-finger and heel-to-shin bilaterally.  Gait and station: Station is normal.   Gait is normal.  Tandem is normal    Reflexes: Deep tendon reflexes are symmetric and normal bilaterally.        DIAGNOSTIC DATA (LABS, IMAGING, TESTING) - I reviewed patient  records, labs, notes, testing and imaging myself where available.  Lab Results  Component Value Date   WBC 9.9 09/30/2020   HGB 14.3 09/30/2020   HCT 43.3 09/30/2020   MCV 92.7 09/30/2020   PLT 328 09/30/2020      Component Value Date/Time   NA 139 09/30/2020 1050   K 3.8 09/30/2020 1050   CL 104 09/30/2020 1050   CO2 25 09/30/2020 1050   GLUCOSE 82 09/30/2020 1050   BUN 11 09/30/2020 1050   CREATININE 0.80 09/30/2020 1050   CREATININE 0.74 02/26/2016 1433   CALCIUM 9.4 09/30/2020 1050   PROT 6.7 09/30/2020 1050   ALBUMIN 3.9 09/30/2020 1050   AST 15 09/30/2020 1050   ALT 11 09/30/2020 1050   ALKPHOS 44 09/30/2020 1050   BILITOT 0.5 09/30/2020 1050   GFRNONAA >60 09/30/2020 1050   GFRAA >60 04/22/2016 1111      ASSESSMENT AND PLAN  No diagnosis found.   Emgality has helped a lot but she has been out due to losing insurance.    We do not have Emgality samples --- provided with2 months Ajovy provided (Exp [id number] and Lot [id number]) Rest as needed for migraine.   She will continue to take Imitrex injections when one of her worst headaches occur.   4.   Return in 3 months or sooner for new or worsening neurologic symptoms.      I spent *** minutes of face-to-face and non-face-to-face time with patient.  This included previsit chart review, lab review, study review, order entry, electronic health record documentation, patient education and discussion regarding above diagnoses and treatment plan and answered all other questions to patient's satisfaction  Ihor Austin, [facility]  [facility] [address] [place], [place] [address]  Phone [phone] Fax [phone] Note: This document was prepared with digital dictation and possible smart phrase technology. Any transcriptional errors that result from this process are unintentional.

## 2022-12-18 ENCOUNTER — Ambulatory Visit: Payer: Medicaid Other | Admitting: Adult Health

## 2022-12-18 ENCOUNTER — Encounter: Payer: Self-pay | Admitting: Adult Health

## 2022-12-18 VITALS — BP 136/83 | HR 120 | Ht 66.0 in | Wt 160.0 lb

## 2022-12-18 DIAGNOSIS — G43701 Chronic migraine without aura, not intractable, with status migrainosus: Secondary | ICD-10-CM | POA: Diagnosis not present

## 2022-12-18 DIAGNOSIS — G44029 Chronic cluster headache, not intractable: Secondary | ICD-10-CM

## 2022-12-18 MED ORDER — SUMATRIPTAN SUCCINATE 6 MG/0.5ML ~~LOC~~ SOLN: 6.0000 mg | SUBCUTANEOUS | 11 refills | Status: DC | PRN

## 2022-12-18 MED ORDER — AJOVY 225 MG/1.5ML ~~LOC~~ SOAJ: 225.0000 mg | SUBCUTANEOUS | 11 refills | Status: DC

## 2022-12-18 NOTE — Patient Instructions (Addendum)
Your Plan:  Start Ajovy monthly injection   Restart sumatriptan injection as needed for migraine rescue  Limit use of OTC pain relievers as this can cause rebound (worsening) headaches     Follow up in 6 months or call earlier if needed       Thank you for coming to see [place] at [facility]. I hope we have been able to provide you high quality care today.  You may receive a patient satisfaction survey over the next few weeks. We would appreciate your feedback and comments so that we may continue to improve ourselves and the health of our patients.

## 2022-12-25 ENCOUNTER — Other Ambulatory Visit (HOSPITAL_COMMUNITY): Payer: Self-pay

## 2022-12-25 ENCOUNTER — Telehealth: Payer: Self-pay

## 2022-12-25 NOTE — Telephone Encounter (Signed)
Pharmacy Patient Advocate Encounter   Received notification from CoverMyMeds that prior authorization for AJOVY (fremanezumab-vfrm) injection 225MG /1.5ML auto-injectors is required/requested.   Insurance verification completed.   The patient is insured through [facility] .  Prior Authorization for AJOVY (fremanezumab-vfrm) injection 225MG /1.5ML auto-injectors has been APPROVED from 12-25-2022 to 03-24-2023   PA #/Case ID/Reference #: [id number]

## 2022-12-27 ENCOUNTER — Ambulatory Visit (HOSPITAL_COMMUNITY)
Admission: EM | Admit: 2022-12-27 | Discharge: 2022-12-27 | Disposition: A | Discharge status: Home / Self Care | Payer: Medicaid Other | Attending: Emergency Medicine | Admitting: Emergency Medicine

## 2022-12-27 ENCOUNTER — Encounter (HOSPITAL_COMMUNITY): Payer: Self-pay | Admitting: *Deleted

## 2022-12-27 DIAGNOSIS — N760 Acute vaginitis: Secondary | ICD-10-CM | POA: Diagnosis present

## 2022-12-27 DIAGNOSIS — Z760 Encounter for issue of repeat prescription: Secondary | ICD-10-CM | POA: Insufficient documentation

## 2022-12-27 LAB — HIV ANTIBODY (ROUTINE TESTING W REFLEX): HIV Screen 4th Generation wRfx: NONREACTIVE

## 2022-12-27 MED ORDER — VALACYCLOVIR HCL 500 MG PO TABS: 500.0000 mg | ORAL_TABLET | Freq: Two times a day (BID) | ORAL | 99 refills | Status: DC

## 2022-12-27 MED ORDER — PANTOPRAZOLE SODIUM 40 MG PO TBEC: DELAYED_RELEASE_TABLET | ORAL | 0 refills | Status: AC

## 2022-12-27 MED ORDER — FLUCONAZOLE 150 MG PO TABS: 150.0000 mg | ORAL_TABLET | Freq: Every day | ORAL | 0 refills | Status: AC

## 2022-12-27 NOTE — Discharge Instructions (Addendum)
You have been screened for sexually transmitted infections as well as bacterial vaginosis and yeast.  Due to the vaginal itching I am covering for yeast with a Diflucan.  Take this today.  I have refilled your Protonix and her Valtrex.  Please follow-up with a primary care provider regarding further medication refills and management of any chronic reoccurring conditions.   Return to clinic for any new or urgent symptoms.

## 2022-12-27 NOTE — ED Provider Notes (Signed)
[facility]    CSN: [id number] Arrival date & time: 12/27/22  0813      History   Chief Complaint Chief Complaint  Patient presents with   Vaginal Discharge   Medication Refill    HPI Selena Haynes is a 37 y.o. female.   Patient presents to clinic for complaints of vaginal irritation, itching and increase in white discharge.  A few days ago she was having intercourse when the condom came off.  She was able to manually retrieve this condom around 15 to 20 minutes after it got stuck and multiple attempts to remove.  Immediately after the removal she started to have vaginal irritation.  This morning she woke up with more itching/discomfort and a discharge.  She also shaved recently so she is not sure if it is related, she is not sexually active often.  She is not having any fever, flank pain, abdominal pain, dysuria or vomiting. Would like HIV and syphilis screening.   The history is provided by the patient and medical records.  Vaginal Discharge Medication Refill   Past Medical History:  Diagnosis Date   Anxiety    Asthma    Back pain    Carpal tunnel syndrome    Hypertension    Post traumatic stress disorder (PTSD)    Postpartum depression     Patient Active Problem List   Diagnosis Date Noted   HSV (herpes simplex virus) infection 05/20/2022   Right sided numbness 05/15/2021   Chronic migraine w/o aura, not intractable, w stat migr 11/08/2020   Chronic cluster headache, not intractable 10/05/2020   Neck pain 10/05/2020   Occipital neuralgia of right side 10/05/2020   Cephalalgia 10/05/2020   Allergic rhinitis 10/26/2019   Cigarette smoker 10/26/2019   Cough variant asthma 01/29/2018   Bronchitis with asthma, acute 01/09/2018   Thrush, oral 01/09/2018   Rhinitis, nonallergic, chronic 09/24/2016   GERD without esophagitis 09/24/2016   Carpal tunnel syndrome, right upper limb 05/22/2016   Anxiety disorder affecting pregnancy, antepartum 05/22/2016    Previous cesarean delivery affecting pregnancy, antepartum 02/26/2016    Past Surgical History:  Procedure Laterality Date   CESAREAN SECTION     ULNAR COLLATERAL LIGAMENT REPAIR Right 05/30/2022   Procedure: RIGHT THUMB REPAIR;  Surgeon: Betha Loa, MD;  Location: [facility];  Service: Orthopedics;  Laterality: Right;  60 MIN    OB History     Gravida  5   Para  3   Term  3   Preterm  0   AB  2   Living  3      SAB  2   IAB  0   Ectopic  0   Multiple  0   Live Births  3            Home Medications    Prior to Admission medications   Medication Sig Start Date End Date Taking? Authorizing Provider  fluconazole (DIFLUCAN) 150 MG tablet Take 1 tablet (150 mg total) by mouth daily. 12/27/22  Yes Rinaldo Ratel, [place] N, FNP  Fremanezumab-vfrm (AJOVY) 225 MG/1.5ML SOAJ Inject 225 mg into the skin every 30 (thirty) days. 12/18/22  Yes McCue, Shanda Bumps, NP  albuterol (VENTOLIN HFA) 108 (90 Base) MCG/ACT inhaler Inhale 1-2 puffs into the lungs every 6 (six) hours as needed for wheezing or shortness of breath. 10/18/19   Wieters, Hallie C, PA-C  budesonide-formoterol (SYMBICORT) 160-4.5 MCG/ACT inhaler Inhale 2 puffs into the lungs 2 (two) times daily.  10/18/19   Wieters, Hallie C, PA-C  cetirizine (ZYRTEC) 10 MG tablet Take 1 tablet (10 mg total) by mouth daily. 10/26/19   Coral Ceo, NP  ELDERBERRY PO Take by mouth.    [provider]  hydrocortisone cream 1 % Apply 1 application topically 2 (two) times daily as needed for itching. otc medication for itching    [provider]  ipratropium-albuterol (DUONEB) 0.5-2.5 (3) MG/3ML SOLN Take 3 mLs by nebulization every 4 (four) hours as needed. 10/18/19   Wieters, Hallie C, PA-C  Multiple Vitamins-Minerals (EMERGEN-C IMMUNE PO) Take by mouth.    [provider]  pantoprazole (PROTONIX) 40 MG tablet Take 30- 60 min before your first and last meals of the day 12/27/22   Nicolae Vasek, [place] N,  FNP  SUMAtriptan (IMITREX) 6 MG/0.5ML SOLN injection Inject 0.5 mLs (6 mg total) into the skin every 2 (two) hours as needed for migraine or headache. May repeat in 2 hours if headache persists or recurs.    No more than 4/week 12/18/22   Ihor Austin, NP  valACYclovir (VALTREX) 500 MG tablet Take 1 tablet (500 mg total) by mouth 2 (two) times daily. 12/27/22   Durell Lofaso, [place] N, FNP    Family History Family History  Problem Relation Age of Onset   Diabetes Mother    Migraines Mother    Asthma Sister    Asthma Brother    Hypertension Maternal Aunt    Diabetes Maternal Uncle    Hypertension Maternal Grandmother    Diabetes Maternal Grandmother     Social History Social History   Tobacco Use   Smoking status: Some Days    Current packs/day: 0.50    Average packs/day: 0.5 packs/day for 24.9 years (12.5 ttl pk-yrs)    Types: Cigarettes    Start date: 01/21/1998   Smokeless tobacco: Former  Building services engineer status: Never Used  Substance Use Topics   Alcohol use: Yes    Comment: social   Drug use: Yes    Types: Marijuana     Allergies   Patient has no known allergies.   Review of Systems Review of Systems  Per HPI   Physical Exam Triage Vital Signs ED Triage Vitals  Encounter Vitals Group     BP 12/27/22 0856 133/72     Systolic BP Percentile --      Diastolic BP Percentile --      Pulse Rate 12/27/22 0856 83     Resp 12/27/22 0856 18     Temp 12/27/22 0856 99.2 F (37.3 C)     Temp Source 12/27/22 0856 Oral     SpO2 12/27/22 0856 99 %     Weight --      Height --      Head Circumference --      Peak Flow --      Pain Score 12/27/22 0853 0     Pain Loc --      Pain Education --      Exclude from Growth Chart --    No data found.  Updated Vital Signs BP 133/72 (BP Location: Right Arm)   Pulse 83   Temp 99.2 F (37.3 C) (Oral)   Resp 18   LMP 12/16/2022 (Approximate)   SpO2 99%   Visual Acuity Right Eye Distance:   Left Eye Distance:    Bilateral Distance:    Right Eye Near:   Left Eye Near:    Bilateral Near:  Physical Exam Vitals and nursing note reviewed.  Constitutional:      Appearance: Normal appearance.  HENT:     Head: Normocephalic and atraumatic.     Right Ear: External ear normal.     Left Ear: External ear normal.     Nose: Nose normal.     Mouth/Throat:     Mouth: Mucous membranes are moist.  Eyes:     Conjunctiva/sclera: Conjunctivae normal.  Cardiovascular:     Rate and Rhythm: Normal rate.  Pulmonary:     Effort: Pulmonary effort is normal. No respiratory distress.  Musculoskeletal:        General: Normal range of motion.  Neurological:     General: No focal deficit present.     Mental Status: She is alert.  Psychiatric:        Mood and Affect: Mood normal.      UC Treatments / Results  Labs (all labs ordered are listed, but only abnormal results are displayed) Labs Reviewed  RPR  HIV ANTIBODY (ROUTINE TESTING W REFLEX)  CERVICOVAGINAL ANCILLARY ONLY    EKG   Radiology No results found.  Procedures Procedures (including critical care time)  Medications Ordered in UC Medications - No data to display  Initial Impression / Assessment and Plan / UC Course  I have reviewed the triage vital signs and the nursing notes.  Pertinent labs & imaging results that were available during my care of the patient were reviewed by me and considered in my medical decision making (see chart for details).  Vitals and triage reviewed, patient is hemodynamically stable.  Vaginal irritation and increase in white discharge after intercourse and manual removal of condom.  Low concern for PID at this time, condom was in place for 15 to 20 minutes after intercourse and full condom was removed afterwards.  No fevers.  No abdominal pain.  Cytology swab obtained as well as HIV and syphilis screening.  Will cover with Diflucan for suspicion of yeast vaginitis.  Plan of care, follow-up care return  precautions given, no questions at this time.  Provided with refills of Protonix and Valtrex.     Final Clinical Impressions(s) / UC Diagnoses   Final diagnoses:  Acute vaginitis  Medication refill     Discharge Instructions      You have been screened for sexually transmitted infections as well as bacterial vaginosis and yeast.  Due to the vaginal itching I am covering for yeast with a Diflucan.  Take this today.  I have refilled your Protonix and her Valtrex.  Please follow-up with a primary care provider regarding further medication refills and management of any chronic reoccurring conditions.   Return to clinic for any new or urgent symptoms.     ED Prescriptions     Medication Sig Dispense Auth. Provider   valACYclovir (VALTREX) 500 MG tablet Take 1 tablet (500 mg total) by mouth 2 (two) times daily. 6 tablet Rinaldo Ratel, [place] N, [place]   pantoprazole (PROTONIX) 40 MG tablet Take 30- 60 min before your first and last meals of the day 60 tablet Rinaldo Ratel, [place] N, FNP   fluconazole (DIFLUCAN) 150 MG tablet Take 1 tablet (150 mg total) by mouth daily. 1 tablet Zelig Gacek, [place] N, FNP      PDMP not reviewed this encounter.   Rinaldo Ratel [place] N, [place] 12/27/22 [phone]

## 2022-12-27 NOTE — ED Triage Notes (Signed)
Pt states a couple days ago she was having sex and the condom came off and was stuck inside her vagina. She did get it out but since she is having some vaginal irritation and discharge. She also shaved recently so not sure if its related.   Refill valtrex, protonix as well.

## 2022-12-28 LAB — RPR: RPR Ser Ql: NONREACTIVE

## 2022-12-30 ENCOUNTER — Telehealth (HOSPITAL_COMMUNITY): Payer: Self-pay

## 2022-12-30 LAB — CERVICOVAGINAL ANCILLARY ONLY
Bacterial Vaginitis (gardnerella): POSITIVE — AB
Candida Glabrata: NEGATIVE
Candida Vaginitis: POSITIVE — AB
Chlamydia: NEGATIVE
Comment: NEGATIVE
Comment: NEGATIVE
Comment: NEGATIVE
Comment: NEGATIVE
Comment: NEGATIVE
Comment: NORMAL
Neisseria Gonorrhea: NEGATIVE
Trichomonas: NEGATIVE

## 2022-12-30 MED ORDER — METRONIDAZOLE 500 MG PO TABS: 500.0000 mg | ORAL_TABLET | Freq: Two times a day (BID) | ORAL | 0 refills | Status: AC

## 2022-12-30 NOTE — Telephone Encounter (Signed)
Per protocol, pt requires tx with metronidazole. Rx sent to pharmacy on file.

## 2023-02-18 ENCOUNTER — Encounter: Payer: Self-pay | Admitting: Adult Health

## 2023-02-19 ENCOUNTER — Telehealth: Payer: Self-pay | Admitting: Adult Health

## 2023-02-19 NOTE — Telephone Encounter (Signed)
If no benefit with Ajovy and prior lack of benefit with Emgality, we could see if insurance will approve Botox injections or Vyepti if patient is interested.  Please let me know.  Thank you.

## 2023-02-19 NOTE — Telephone Encounter (Signed)
Pt said, migraines has worsened, can't eat,  not able to sleep, dizziness, staying in the dark, emergency shots works but do not want  to keep sticking everyday. Having migraines every day. Have not been to the ED. Could you please help give me some relief. Would like a call back for a work in appt.

## 2023-02-19 NOTE — Telephone Encounter (Signed)
Pt had also sent a mychart message yesterday as well copied below. "Good morning reaching out because I'm having bad headaches still the monthly shot is not working."

## 2023-02-20 ENCOUNTER — Ambulatory Visit: Payer: Medicaid Other | Admitting: Adult Health

## 2023-02-20 MED ORDER — RIZATRIPTAN BENZOATE 10 MG PO TABS: 10.0000 mg | ORAL_TABLET | ORAL | 0 refills | Status: DC | PRN

## 2023-02-20 NOTE — Telephone Encounter (Signed)
Placed the Vyepti infusion form in Dr Epimenio Foot pod for him to sign off since Ihor Austin has left for vacation.

## 2023-02-20 NOTE — Telephone Encounter (Signed)
Can we see if she has tried rizatriptan previously? If so, we could try Nurtec or Ubrelvy for rescue.

## 2023-02-20 NOTE — Addendum Note (Signed)
Addended by: Judi Cong on: 02/20/2023 04:09 PM   Modules accepted: Orders

## 2023-03-03 ENCOUNTER — Other Ambulatory Visit: Payer: Self-pay | Admitting: Adult Health

## 2023-03-10 ENCOUNTER — Other Ambulatory Visit (HOSPITAL_COMMUNITY): Payer: Self-pay

## 2023-03-17 ENCOUNTER — Other Ambulatory Visit: Payer: Self-pay | Admitting: Neurology

## 2023-03-17 MED ORDER — RIZATRIPTAN BENZOATE 10 MG PO TABS: 10.0000 mg | ORAL_TABLET | ORAL | 4 refills | Status: DC | PRN

## 2023-03-26 ENCOUNTER — Telehealth: Payer: Self-pay | Admitting: Pharmacy Technician

## 2023-03-26 ENCOUNTER — Telehealth: Payer: Self-pay | Admitting: Neurology

## 2023-03-26 ENCOUNTER — Other Ambulatory Visit (HOSPITAL_COMMUNITY): Payer: Self-pay

## 2023-03-26 NOTE — Telephone Encounter (Signed)
 PA was submitted through [facility] by vital care.  [id number] PA was instantly approved PA Case: [id number], Status: Approved, Coverage Starts on: 03/26/2023 12:00:00 AM, Coverage Ends on: 09/22/2023 12:00:00 AM. Effective Date: 03/25/2023 Authorization Expiration Date: 09/21/2023

## 2023-03-26 NOTE — Telephone Encounter (Signed)
 Pharmacy Patient Advocate Encounter  Received notification from [facility] that Prior Authorization for AJOVY (fremanezumab-vfrm) injection 225MG /1.5ML auto-injectors has been APPROVED from 03/26/2023 to 03/25/2024   PA #/Case ID/Reference #: [id number] Key: [id number]

## 2023-03-31 NOTE — Telephone Encounter (Signed)
 I received a update from [facility] via email on Friday.   an update on this patient "we are setting up her delivery and scheduling her nursing for next week. So she is all taken care of! Thank you!"

## 2023-04-01 NOTE — Telephone Encounter (Signed)
 Received a notification from [facility] indicating that the Vyepti infusions are being administered through [facility]. Her start date is set for 04/07/23

## 2023-04-16 ENCOUNTER — Other Ambulatory Visit: Payer: Self-pay | Admitting: Adult Health

## 2023-04-17 ENCOUNTER — Telehealth: Payer: Self-pay

## 2023-04-17 ENCOUNTER — Other Ambulatory Visit (HOSPITAL_COMMUNITY): Payer: Self-pay

## 2023-04-17 NOTE — Telephone Encounter (Signed)
 Received a request to do a PA for Ajovy-NO PA needed at this time-active PA on file-per text claim Pharmacy needs to submit override codes for Drug Utilization Review. Will share the screenshots.

## 2023-05-26 ENCOUNTER — Encounter (HOSPITAL_COMMUNITY): Payer: Self-pay | Admitting: Emergency Medicine

## 2023-05-26 ENCOUNTER — Other Ambulatory Visit: Payer: Self-pay

## 2023-05-26 ENCOUNTER — Emergency Department (HOSPITAL_COMMUNITY)
Admission: EM | Admit: 2023-05-26 | Discharge: 2023-05-26 | Disposition: A | Discharge status: Home / Self Care | Attending: Emergency Medicine | Admitting: Emergency Medicine

## 2023-05-26 ENCOUNTER — Emergency Department (HOSPITAL_COMMUNITY)

## 2023-05-26 DIAGNOSIS — R112 Nausea with vomiting, unspecified: Secondary | ICD-10-CM | POA: Insufficient documentation

## 2023-05-26 DIAGNOSIS — R1084 Generalized abdominal pain: Secondary | ICD-10-CM | POA: Insufficient documentation

## 2023-05-26 DIAGNOSIS — J45909 Unspecified asthma, uncomplicated: Secondary | ICD-10-CM | POA: Diagnosis not present

## 2023-05-26 DIAGNOSIS — K5900 Constipation, unspecified: Secondary | ICD-10-CM | POA: Insufficient documentation

## 2023-05-26 DIAGNOSIS — I10 Essential (primary) hypertension: Secondary | ICD-10-CM | POA: Insufficient documentation

## 2023-05-26 DIAGNOSIS — K529 Noninfective gastroenteritis and colitis, unspecified: Secondary | ICD-10-CM

## 2023-05-26 LAB — COMPREHENSIVE METABOLIC PANEL WITH GFR
ALT: 17 U/L (ref 0–44)
AST: 14 U/L — ABNORMAL LOW (ref 15–41)
Albumin: 4 g/dL (ref 3.5–5.0)
Alkaline Phosphatase: 65 U/L (ref 38–126)
Anion gap: 10 (ref 5–15)
BUN: 11 mg/dL (ref 6–20)
CO2: 23 mmol/L (ref 22–32)
Calcium: 9.5 mg/dL (ref 8.9–10.3)
Chloride: 105 mmol/L (ref 98–111)
Creatinine, Ser: 0.99 mg/dL (ref 0.44–1.00)
GFR, Estimated: >60 mL/min (ref >60)
Glucose, Bld: 106 mg/dL — ABNORMAL HIGH (ref 70–99)
Potassium: 3.6 mmol/L (ref 3.5–5.1)
Sodium: 138 mmol/L (ref 135–145)
Total Bilirubin: 0.7 mg/dL (ref 0.0–1.2)
Total Protein: 6.8 g/dL (ref 6.5–8.1)

## 2023-05-26 LAB — CBC
HCT: 41.2 % (ref 36.0–46.0)
Hemoglobin: 14.3 g/dL (ref 12.0–15.0)
MCH: 30.7 pg (ref 26.0–34.0)
MCHC: 34.7 g/dL (ref 30.0–36.0)
MCV: 88.4 fL (ref 80.0–100.0)
Platelets: 322 10*3/uL (ref 150–400)
RBC: 4.66 MIL/uL (ref 3.87–5.11)
RDW: 12.2 % (ref 11.5–15.5)
WBC: 10.5 10*3/uL (ref 4.0–10.5)
nRBC: 0 % (ref 0.0–0.2)

## 2023-05-26 LAB — URINALYSIS, ROUTINE W REFLEX MICROSCOPIC
Bilirubin Urine: NEGATIVE
Glucose, UA: NEGATIVE mg/dL
Hgb urine dipstick: NEGATIVE
Ketones, ur: NEGATIVE mg/dL
Leukocytes,Ua: NEGATIVE
Nitrite: NEGATIVE
Protein, ur: NEGATIVE mg/dL
Specific Gravity, Urine: 1.024 (ref 1.005–1.030)
pH: 6 (ref 5.0–8.0)

## 2023-05-26 LAB — HCG, SERUM, QUALITATIVE: Preg, Serum: NEGATIVE

## 2023-05-26 LAB — LIPASE, BLOOD: Lipase: 31 U/L (ref 11–51)

## 2023-05-26 MED ORDER — DICYCLOMINE HCL 20 MG PO TABS: 20.0000 mg | ORAL_TABLET | Freq: Two times a day (BID) | ORAL | 0 refills | Status: AC

## 2023-05-26 MED ORDER — IOHEXOL 350 MG/ML SOLN
75.0000 mL | Freq: Once | INTRAVENOUS | Status: AC | PRN
  Administered 2023-05-26: 75 mL via INTRAVENOUS

## 2023-05-26 MED ORDER — DICYCLOMINE HCL 10 MG PO CAPS
10.0000 mg | ORAL_CAPSULE | Freq: Once | ORAL | Status: AC
  Administered 2023-05-26: 10 mg via ORAL
  Filled 2023-05-26: qty 1

## 2023-05-26 MED ORDER — SODIUM CHLORIDE 0.9 % IV BOLUS
1000.0000 mL | Freq: Once | INTRAVENOUS | Status: AC
  Administered 2023-05-26: 1000 mL via INTRAVENOUS

## 2023-05-26 MED ORDER — ONDANSETRON HCL 4 MG/2ML IJ SOLN
4.0000 mg | Freq: Once | INTRAMUSCULAR | Status: AC
  Administered 2023-05-26: 4 mg via INTRAVENOUS
  Filled 2023-05-26: qty 2

## 2023-05-26 NOTE — ED Triage Notes (Signed)
 Pt c/o lower abdominal pain, recently had N/V/D earlier this week. Now c/o constipation.

## 2023-05-26 NOTE — Discharge Instructions (Signed)
 Please stick to a bland diet such as bananas, rice, applesauce, toast, drink plenty of fluids such as Pedialyte, Gatorade, you can perform a glycerin  suppository as you have discussed with Rice Chamorro, and continue take MiraLAX as needed.  To reassure you however there is not a fair amount of stool in the colon, the reason that you cannot have a satisfying bowel movement is likely because there is not a lot of stool in the colon after not being able to eat for the last several days.

## 2023-05-26 NOTE — ED Notes (Signed)
 Patient transported to CT

## 2023-05-26 NOTE — ED Provider Notes (Signed)
 Accepted handoff at shift change from Editha Goring PA-C. Please see prior provider note for more detail.   Briefly: Patient is 38 y.o.   DDX: concern for abdominal pain with nausea, vomiting, constipation.   Plan: I independently interpreted imaging including CT abd pelvis which shows. I agree with the radiologist interpretation.  CT abdomen pelvis with some colonic inflammation consistent enteritis, no evidence status, diverticulitis.  Considering her lab work with no leukocytosis, she is afebrile suspect a inflammatory enteritis, low clinical suspicion for acute bacterial enteritis.  Will plan to discharge with Zofran , Bentyl , encouraged brat diet, close GI follow-up as needed, plenty of fluids, glycerin  suppository at home.     Nelly Banco, PA-C 05/26/23 1610    Lindle Rhea, MD 05/27/23 [phone]

## 2023-05-26 NOTE — ED Notes (Signed)
 Patient returned from CT

## 2023-05-26 NOTE — ED Provider Notes (Signed)
 [place] EMERGENCY DEPARTMENT AT [facility] Provider Note   CSN: [id number] Arrival date & time: 05/26/23  0455     History  Chief Complaint  Patient presents with   Abdominal Pain    Selena Haynes is a 38 y.o. female.  38 yo female presents with complaint of abdominal pain with nausea, vomiting, constipation. Patient states onset with abdominal pain, nausea, vomiting, diarrhea around 4/30 after eating out for her birthday, assumed she ate something and got food poisoning. Patient went to UC, got Zofran  and thought this was helping. Now with intermittent abdominal cramping, constipation and vomiting. Patient tried enema x 2 at home and tried to self disimpact without relief. No prior abdominal surgeries. No fevers.        Home Medications Prior to Admission medications   Medication Sig Start Date End Date Taking? Authorizing Provider  albuterol  (VENTOLIN  HFA) 108 (90 Base) MCG/ACT inhaler Inhale 1-2 puffs into the lungs every 6 (six) hours as needed for wheezing or shortness of breath. 10/18/19   Wieters, Hallie C, PA-C  budesonide -formoterol  (SYMBICORT ) 160-4.5 MCG/ACT inhaler Inhale 2 puffs into the lungs 2 (two) times daily. 10/18/19   Wieters, Hallie C, PA-C  cetirizine  (ZYRTEC ) 10 MG tablet Take 1 tablet (10 mg total) by mouth daily. 10/26/19   Jenni Mody, NP  ELDERBERRY PO Take by mouth.    [provider]  fluconazole  (DIFLUCAN ) 150 MG tablet Take 1 tablet (150 mg total) by mouth daily. 12/27/22   Harlow Lighter, [place]  N, FNP  Fremanezumab -vfrm (AJOVY ) 225 MG/1.5ML SOAJ Inject 225 mg into the skin every 30 (thirty) days. 12/18/22   Johny Nap, NP  hydrocortisone  cream 1 % Apply 1 application topically 2 (two) times daily as needed for itching. otc medication for itching    [provider]  ipratropium-albuterol  (DUONEB) 0.5-2.5 (3) MG/3ML SOLN Take 3 mLs by nebulization every 4 (four) hours as needed. 10/18/19   Wieters, Hallie C, PA-C  Multiple  Vitamins-Minerals (EMERGEN-C IMMUNE PO) Take by mouth.    [provider]  pantoprazole  (PROTONIX ) 40 MG tablet Take 30- 60 min before your first and last meals of the day 12/27/22   Harlow Lighter, [place]  N, FNP  rizatriptan  (MAXALT ) 10 MG tablet Take 1 tablet (10 mg total) by mouth as needed for migraine (Take 1 tab at onset of migraine. may repeat an additional dose if needed in 2 hours (Do not take more than 2 tablet in 24 hour period)). May repeat in 2 hours if needed 03/17/23   Johny Nap, NP  SUMAtriptan  (IMITREX ) 6 MG/0.5ML SOLN injection Inject 0.5 mLs (6 mg total) into the skin every 2 (two) hours as needed for migraine or headache. May repeat in 2 hours if headache persists or recurs.    No more than 4/week 12/18/22   Johny Nap, NP  valACYclovir  (VALTREX ) 500 MG tablet Take 1 tablet (500 mg total) by mouth 2 (two) times daily. 12/27/22   Ritchie Cheshire, FNP      Allergies    Patient has no known allergies.    Review of Systems   Review of Systems Negative except as per HPI Physical Exam Updated Vital Signs BP (!) 109/54   Pulse 89   Temp 98.3 F (36.8 C)   Resp (!) 21   Ht 5\' 6"  (1.676 m)   Wt 68 kg   LMP 05/16/2023   SpO2 100%   BMI 24.21 kg/m  Physical Exam Vitals and nursing note reviewed.  Constitutional:      General: She is not in acute distress.    Appearance: She is well-developed. She is not diaphoretic.  HENT:     Head: Normocephalic and atraumatic.  Cardiovascular:     Rate and Rhythm: Normal rate and regular rhythm.     Heart sounds: Normal heart sounds.  Pulmonary:     Effort: Pulmonary effort is normal.     Breath sounds: Normal breath sounds.  Abdominal:     General: Bowel sounds are normal.     Palpations: Abdomen is soft.     Tenderness: There is generalized abdominal tenderness.  Skin:    General: Skin is warm and dry.  Neurological:     Mental Status: She is alert and oriented to person, place, and time.  Psychiatric:         Behavior: Behavior normal.     ED Results / Procedures / Treatments   Labs (all labs ordered are listed, but only abnormal results are displayed) Labs Reviewed  COMPREHENSIVE METABOLIC PANEL WITH GFR - Abnormal; Notable for the following components:      Result Value   Glucose, Bld 106 (*)    AST 14 (*)    All other components within normal limits  URINALYSIS, ROUTINE W REFLEX MICROSCOPIC - Abnormal; Notable for the following components:   APPearance HAZY (*)    Bacteria, UA RARE (*)    All other components within normal limits  LIPASE, BLOOD  CBC  HCG, SERUM, QUALITATIVE    EKG None  Radiology No results found.  Procedures Procedures    Medications Ordered in ED Medications  sodium chloride  0.9 % bolus 1,000 mL (1,000 mLs Intravenous New Bag/Given 05/26/23 0559)  ondansetron  (ZOFRAN ) injection 4 mg (4 mg Intravenous Given 05/26/23 0557)    ED Course/ Medical Decision Making/ A&P                                 Medical Decision Making Amount and/or Complexity of Data Reviewed Labs: ordered. Radiology: ordered.  Risk Prescription drug management.   This patient presents to the ED for concern of abdominal pain, vomiting, constipation, this involves an extensive number of treatment options, and is a complaint that carries with it a high risk of complications and morbidity.  The differential diagnosis includes constipation, appendicitis, diverticulitis, colitis   Co morbidities that complicate the patient evaluation  HTN, asthma   Additional history obtained:  External records from outside source obtained and reviewed including no prior abdominal imaging on file   Lab Tests:  I Ordered, and personally interpreted labs.  The pertinent results include:  CMP without significant findings. UA with rare bacteria, no evidence or symptoms of infection. Lipase WNL. Hcg negative. CBC WNL.    Imaging Studies ordered:  I ordered imaging studies including CT a/p   Pending at time of sign out   Problem List / ED Course / Critical interventions / Medication management  38 year old female with abdominal pain, nausea, vomiting. Feels constipated, no relief after enema at home. Possibly constipated following recent GI illness and taking zofran , consider other pathology given 5 days of symptoms. Labs reassuring. Care signed out at change of shift pending abdominal CT. If negative, can consider glycerin  suppository and DC home for miralax or mag citrate.  I ordered medication including zofran , IVF  for nausea  I have reviewed the patients home medicines and have made adjustments  as needed   Social Determinants of Health:  No PCP on file   Test / Admission - Considered:  Disposition pending at time of sign out to oncoming provider          Final Clinical Impression(s) / ED Diagnoses Final diagnoses:  Generalized abdominal pain  Nausea and vomiting, unspecified vomiting type    Rx / DC Orders ED Discharge Orders     None         Darlis Eisenmenger, PA-C 05/26/23 0615    Lindle Rhea, MD 05/27/23 [phone]

## 2023-06-18 NOTE — Progress Notes (Unsigned)
 [facility]  PATIENT: Selena Haynes DOB: April 12, 1985  REFERRING DOCTOR OR PCP: Abby Hocking, MD; Avel Leiter, MD SOURCE: Patient, Notes from ED  _________________________________   HISTORICAL  CHIEF COMPLAINT:  No chief complaint on file.   HISTORY OF PRESENT ILLNESS:  Selena Haynes is a 38 y.o. woman with headaches.  Update 06/19/2023: Returns for overdue follow-up visit.  Prior visit 12/18/2022  At prior visit, recommended initiating Ajovy  due to limited benefit with Emgality .  Unfortunately, she sent MyChart message in January noting continued migraines on Ajovy .  She preferred to pursue Vyepti over Botox, received initial Vyepti infusion 04/07/2023 through [facility] home infusions.  She was also started on rizatriptan  for rescue   She reports continued daily headaches.  Was able to restart Emgality  for a couple of months which helped decrease severity and frequency but still present, she has not received any injections over the past couple of months as pharmacy unable to obtain, migraines gradually worsening since then.  She is no longer taking topiramate  due to nausea.  Uses sumatriptan  with more severe migraines with benefit but currently out of refills.  Headaches remain present primarily on right side, can affect temporal and periorbital area with sharp stabbing pain with cranial autonomic symptoms (ptosis, eye tearing, nasal congestion), headaches can also affect right side of head and radiate posterior into occipital.  At times, right side of head can feel numb, can have numbness/pain radiating down into shoulder but denies radiating into arms, does have occasional cervicalgia.  She does admit to frequent use of Excedrin and occasional use of Goody powders.  Migraines can be debilitating where she needs to lay down in a dark quiet room which will help, can also be associated with nausea.   Medications tried: Verapamil , muscle relaxants  (cyclobenzaprine  and tizanidine ), Topamax  and NSAIDs have not helped.     Imitrex  injections have helped some but HA returns the next day.  Rizatriptan  ***.  Triptan pills help a little. Emgality  and Ajovy  decreased severity and frequency but still present.  Nerve block only lasted short duration then headaches returned.  Aimovig contraindicated due to underlying constipation.  She has had occasional episodic migraines x many years.  These seem to be coming more frequently these were throbbing with N/V and photophobia   Taking an NSAID and laying down would help.   Sleep would end the migraine.    HA History from 2022: Since September, she has had frequent headaches radiating from the right occiput and to the right eye.  Vision seems to be blurry when that happens.  The most intense pain is in the forehead.  When the pain is more intense she also notes symptoms in the right arm.   These seem different than her episodic migraines.   She get nausea and vomiting.   She has waken up with N/V with a headache some times, as well.    These headaches are occurring a couple times a day - about 1/2 or more at night.     She has photophobia and phonophobia.   She also notes pain in the upper cervical spine when  the headaches are intense.         MRI of the brain 09/30/2020 was normal.    MR angiogram of the neck and head 09/30/2020 was normal.   REVIEW OF SYSTEMS: Constitutional: No fevers, chills, sweats, or change in appetite Eyes: No visual changes, double vision, eye pain Ear, nose and throat: No hearing loss,  ear pain, nasal congestion, sore throat Cardiovascular: No chest pain, palpitations Respiratory:  No shortness of breath at rest or with exertion.   No wheezes GastrointestinaI: No nausea, vomiting, diarrhea, abdominal pain, fecal incontinence Genitourinary:  No dysuria, urinary retention or frequency.  No nocturia. Musculoskeletal:  No neck pain, back pain Integumentary: No rash, pruritus, skin  lesions Neurological: as above Psychiatric: No depression at this time.  No anxiety Endocrine: No palpitations, diaphoresis, change in appetite, change in weigh or increased thirst Hematologic/Lymphatic:  No anemia, purpura, petechiae. Allergic/Immunologic: No itchy/runny eyes, nasal congestion, recent allergic reactions, rashes  ALLERGIES: No Known Allergies  HOME MEDICATIONS:  Current Outpatient Medications:    albuterol  (VENTOLIN  HFA) 108 (90 Base) MCG/ACT inhaler, Inhale 1-2 puffs into the lungs every 6 (six) hours as needed for wheezing or shortness of breath., Disp: 18 g, Rfl: 0   budesonide -formoterol  (SYMBICORT ) 160-4.5 MCG/ACT inhaler, Inhale 2 puffs into the lungs 2 (two) times daily., Disp: 10.2 g, Rfl: 0   cetirizine  (ZYRTEC ) 10 MG tablet, Take 1 tablet (10 mg total) by mouth daily., Disp: 30 tablet, Rfl: 12   dicyclomine  (BENTYL ) 20 MG tablet, Take 1 tablet (20 mg total) by mouth 2 (two) times daily., Disp: 20 tablet, Rfl: 0   ELDERBERRY PO, Take by mouth., Disp: , Rfl:    fluconazole  (DIFLUCAN ) 150 MG tablet, Take 1 tablet (150 mg total) by mouth daily., Disp: 1 tablet, Rfl: 0   Fremanezumab -vfrm (AJOVY ) 225 MG/1.5ML SOAJ, Inject 225 mg into the skin every 30 (thirty) days., Disp: 1.68 mL, Rfl: 11   hydrocortisone  cream 1 %, Apply 1 application topically 2 (two) times daily as needed for itching. otc medication for itching, Disp: , Rfl:    ipratropium-albuterol  (DUONEB) 0.5-2.5 (3) MG/3ML SOLN, Take 3 mLs by nebulization every 4 (four) hours as needed., Disp: 360 mL, Rfl: 0   Multiple Vitamins-Minerals (EMERGEN-C IMMUNE PO), Take by mouth., Disp: , Rfl:    pantoprazole  (PROTONIX ) 40 MG tablet, Take 30- 60 min before your first and last meals of the day, Disp: 60 tablet, Rfl: 0   rizatriptan  (MAXALT ) 10 MG tablet, Take 1 tablet (10 mg total) by mouth as needed for migraine (Take 1 tab at onset of migraine. may repeat an additional dose if needed in 2 hours (Do not take more than  2 tablet in 24 hour period)). May repeat in 2 hours if needed, Disp: 10 tablet, Rfl: 4   SUMAtriptan  (IMITREX ) 6 MG/0.5ML SOLN injection, Inject 0.5 mLs (6 mg total) into the skin every 2 (two) hours as needed for migraine or headache. May repeat in 2 hours if headache persists or recurs.    No more than 4/week, Disp: 6 mL, Rfl: 11   valACYclovir  (VALTREX ) 500 MG tablet, Take 1 tablet (500 mg total) by mouth 2 (two) times daily., Disp: 6 tablet, Rfl: PRN  PAST MEDICAL HISTORY: Past Medical History:  Diagnosis Date   Anxiety    Asthma    Back pain    Carpal tunnel syndrome    Hypertension    Post traumatic stress disorder (PTSD)    Postpartum depression     PAST SURGICAL HISTORY: Past Surgical History:  Procedure Laterality Date   CESAREAN SECTION     ULNAR COLLATERAL LIGAMENT REPAIR Right 05/30/2022   Procedure: RIGHT THUMB REPAIR;  Surgeon: Brunilda Capra, MD;  Location: [facility];  Service: Orthopedics;  Laterality: Right;  60 MIN    FAMILY HISTORY: Family History  Problem Relation Age  of Onset   Diabetes Mother    Migraines Mother    Asthma Sister    Asthma Brother    Hypertension Maternal Aunt    Diabetes Maternal Uncle    Hypertension Maternal Grandmother    Diabetes Maternal Grandmother     SOCIAL HISTORY:  Social History   Socioeconomic History   Marital status: Single    Spouse name: Not on file   Number of children: 3   Years of education: Not on file   Highest education level: High school graduate  Occupational History   Not on file  Tobacco Use   Smoking status: Some Days    Current packs/day: 0.50    Average packs/day: 0.5 packs/day for 25.4 years (12.7 ttl pk-yrs)    Types: Cigarettes    Start date: 01/21/1998   Smokeless tobacco: Former  Building services engineer status: Never Used  Substance and Sexual Activity   Alcohol use: Yes    Comment: social   Drug use: Yes    Types: Marijuana   Sexual activity: Yes    Birth control/protection:  Condom  Other Topics Concern   Not on file  Social History Narrative   Lives w 3 children   Right handed   Caffeine: about 5 sodas/coffees in a month   Social Drivers of Corporate investment banker Strain: Not on file  Food Insecurity: Not on file  Transportation Needs: Not on file  Physical Activity: Not on file  Stress: Not on file  Social Connections: Unknown (05/22/2021)   Received from [facility]   Social Network    Social Network: Not on file  Intimate Partner Violence: Unknown (05/22/2021)   Received from [facility], [facility]   HITS    Physically Hurt: Not on file    Insult or Talk Down To: Not on file    Threaten Physical Harm: Not on file    Scream or Curse: Not on file     PHYSICAL EXAM  There were no vitals filed for this visit.    There is no height or weight on file to calculate BMI.   General: The patient is well-developed and well-nourished and in no acute distress.  She has mild  tenderness over the right occiput.     HEENT:  Head is [place]/AT.  Sclera are anicteric.    Skin: Extremities are without rash or  edema.  Musculoskeletal:  Back is nontender  Neurologic Exam  Mental status: The patient is alert and oriented x 3 at the time of the examination. The patient has apparent normal recent and remote memory, with an apparently normal attention span and concentration ability.   Speech is normal.  Cranial nerves: Extraocular movements are full.  Facial strength s normal, cold is more intense on right and touch is reduced on righ face.  . No obvious hearing deficits are noted.  Motor:  Muscle bulk is normal.   Tone is normal. Strength is  5 / 5 in all 4 extremities.   Sensory: Sensory testing is intact to touch x 4   Coordination: Cerebellar testing reveals good finger-nose-finger and heel-to-shin bilaterally.  Gait and station: Station is normal.   Gait is normal.  Tandem is normal    Reflexes: Deep tendon reflexes are  symmetric and normal bilaterally.        DIAGNOSTIC DATA (LABS, IMAGING, TESTING) - I reviewed patient records, labs, notes, testing and imaging myself where available.  Lab Results  Component Value Date   WBC 10.5 05/26/2023   HGB 14.3 05/26/2023   HCT 41.2 05/26/2023   MCV 88.4 05/26/2023   PLT 322 05/26/2023      Component Value Date/Time   NA 138 05/26/2023 0511   K 3.6 05/26/2023 0511   CL 105 05/26/2023 0511   CO2 23 05/26/2023 0511   GLUCOSE 106 (H) 05/26/2023 0511   BUN 11 05/26/2023 0511   CREATININE 0.99 05/26/2023 0511   CREATININE 0.74 02/26/2016 1433   CALCIUM 9.5 05/26/2023 0511   PROT 6.8 05/26/2023 0511   ALBUMIN 4.0 05/26/2023 0511   AST 14 (L) 05/26/2023 0511   ALT 17 05/26/2023 0511   ALKPHOS 65 05/26/2023 0511   BILITOT 0.7 05/26/2023 0511   GFRNONAA >60 05/26/2023 0511   GFRAA >60 04/22/2016 1111      ASSESSMENT AND PLAN  No diagnosis found.   Recommend starting Ajovy  monthly injection as limited benefit with Emgality . Was provided Ajovy  sample while waiting on PA Restart sumatriptan  injection for rescue, discussed limiting use to no more than 4x per week Discussed importance of limiting Excedrin use due to potential of analgesic rebound 4.   Return in 6 months or sooner for new or worsening neurologic symptoms.       I spent 30 minutes of face-to-face and non-face-to-face time with patient.  This included previsit chart review, lab review, study review, order entry, electronic health record documentation, patient education and discussion regarding above diagnoses and treatment plan and answered all other questions to patient's satisfaction  Johny Nap, [facility]  [facility] [address] [place], [place] [address]  Phone [phone] Fax [phone] Note: This document was prepared with digital dictation and possible smart phrase technology. Any transcriptional errors that result from this process  are unintentional.

## 2023-06-19 ENCOUNTER — Ambulatory Visit: Payer: Medicaid Other | Admitting: Adult Health

## 2023-06-19 ENCOUNTER — Encounter: Payer: Self-pay | Admitting: Adult Health

## 2023-06-19 VITALS — BP 97/60 | HR 80 | Ht 66.0 in | Wt 147.0 lb

## 2023-06-19 DIAGNOSIS — G43701 Chronic migraine without aura, not intractable, with status migrainosus: Secondary | ICD-10-CM | POA: Diagnosis not present

## 2023-06-19 DIAGNOSIS — G245 Blepharospasm: Secondary | ICD-10-CM | POA: Diagnosis not present

## 2023-06-19 MED ORDER — RIZATRIPTAN BENZOATE 10 MG PO TABS: 10.0000 mg | ORAL_TABLET | ORAL | 11 refills | Status: AC | PRN

## 2023-06-19 MED ORDER — SUMATRIPTAN SUCCINATE 6 MG/0.5ML ~~LOC~~ SOLN: 6.0000 mg | SUBCUTANEOUS | 11 refills | Status: AC | PRN

## 2023-06-19 NOTE — Patient Instructions (Addendum)
 Your Plan:  Continue Vyepti infusions every 3 months  Consider increasing dose after your next injection if needed - please keep me updated   Continue rizatriptan  and sumatriptan  as needed for migraine rescue      Follow up in 6 months or call earlier if needed     Thank you for coming to see us  at [facility]. I hope we have been able to provide you high quality care today.  You may receive a patient satisfaction survey over the next few weeks. We would appreciate your feedback and comments so that we may continue to improve ourselves and the health of our patients.

## 2023-07-08 ENCOUNTER — Other Ambulatory Visit: Payer: Self-pay

## 2023-07-08 ENCOUNTER — Ambulatory Visit (HOSPITAL_COMMUNITY): Admission: EM | Admit: 2023-07-08 | Discharge: 2023-07-08 | Disposition: A | Discharge status: Home / Self Care

## 2023-07-08 ENCOUNTER — Other Ambulatory Visit (HOSPITAL_COMMUNITY): Payer: Self-pay

## 2023-07-08 ENCOUNTER — Encounter (HOSPITAL_COMMUNITY): Payer: Self-pay

## 2023-07-08 DIAGNOSIS — A6004 Herpesviral vulvovaginitis: Secondary | ICD-10-CM | POA: Diagnosis not present

## 2023-07-08 LAB — POCT URINALYSIS DIP (MANUAL ENTRY)
Bilirubin, UA: NEGATIVE
Blood, UA: NEGATIVE
Glucose, UA: NEGATIVE mg/dL
Ketones, POC UA: NEGATIVE mg/dL
Leukocytes, UA: NEGATIVE
Nitrite, UA: NEGATIVE
Protein Ur, POC: NEGATIVE mg/dL — AB
Spec Grav, UA: 1.015 (ref 1.010–1.025)
Urobilinogen, UA: 0.2 U/dL
pH, UA: 7 (ref 5.0–8.0)

## 2023-07-08 MED ORDER — VALACYCLOVIR HCL 500 MG PO TABS
500.0000 mg | ORAL_TABLET | Freq: Two times a day (BID) | ORAL | 99 refills | Status: AC
  Filled 2023-07-08: qty 6, 3d supply, fill #0

## 2023-07-08 NOTE — Discharge Instructions (Addendum)
  1. 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 -Based on past history had recurrent symptoms of HSV, current symptoms most likely secondary to HSV flareup. - valACYclovir  (VALTREX ) 500 MG tablet; Take 1 tablet (500 mg total) by mouth 2 (two) times daily for 3 days.  Dispense: 6 tablet; Refill: PRN -Continue to monitor symptoms for any change in severity if there is any escalation of current symptoms or development of new symptoms follow-up in ER for further evaluation and management.

## 2023-07-08 NOTE — ED Provider Notes (Cosign Needed)
 [facility]  Note:  This document was prepared using Dragon voice recognition software and may include unintentional dictation errors.  MRN: [id number] DOB: 1985/09/11  Subjective:   Selena Haynes is a 38 y.o. female presenting for vaginal irritation with urination x 2 days.  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  Patient does not have any Valtrex  currently to treat symptoms.  Patient denies any urinary symptoms such as distention, increased frequency, vaginal discharge, vaginal lesions, flank pain, abdominal pain.  Patient reports that she is not sexually active and not concern for any other STIs.  Patient reports that pain is a burning sensation over the vaginal area and only seems to manifest with urination but based on previous outbreaks she is certain that the pain will become more frequent as it progresses.  No current facility-administered medications for this encounter.  Current Outpatient Medications:    albuterol  (VENTOLIN  HFA) 108 (90 Base) MCG/ACT inhaler, Inhale 1-2 puffs into the lungs every 6 (six) hours as needed for wheezing or shortness of breath., Disp: 18 g, Rfl: 0   budesonide -formoterol  (SYMBICORT ) 160-4.5 MCG/ACT inhaler, Inhale 2 puffs into the lungs 2 (two) times daily., Disp: 10.2 g, Rfl: 0   cetirizine  (ZYRTEC ) 10 MG tablet, Take 1 tablet (10 mg total) by mouth daily., Disp: 30 tablet, Rfl: 12   dicyclomine  (BENTYL ) 20 MG tablet, Take 1 tablet (20 mg total) by mouth 2 (two) times daily., Disp: 20 tablet, Rfl: 0   ELDERBERRY PO, Take by mouth., Disp: , Rfl:    fluconazole  (DIFLUCAN ) 150 MG tablet, Take 1 tablet (150 mg total) by mouth daily., Disp: 1 tablet, Rfl: 0   hydrocortisone  cream 1 %, Apply 1 application topically 2 (two) times daily as needed for itching. otc medication for itching, Disp: , Rfl:    ipratropium-albuterol  (DUONEB) 0.5-2.5 (3) MG/3ML SOLN, Take 3 mLs by  nebulization every 4 (four) hours as needed., Disp: 360 mL, Rfl: 0   Multiple Vitamins-Minerals (EMERGEN-C IMMUNE PO), Take by mouth., Disp: , Rfl:    pantoprazole  (PROTONIX ) 40 MG tablet, Take 30- 60 min before your first and last meals of the day, Disp: 60 tablet, Rfl: 0   rizatriptan  (MAXALT ) 10 MG tablet, Take 1 tablet (10 mg total) by mouth as needed for migraine (Take 1 tab at onset of migraine. may repeat an additional dose if needed in 2 hours (Do not take more than 2 tablet in 24 hour period)). May repeat in 2 hours if needed, Disp: 12 tablet, Rfl: 11   SUMAtriptan  (IMITREX ) 6 MG/0.5ML SOLN injection, Inject 0.5 mLs (6 mg total) into the skin as needed for migraine or headache. May repeat in 2 hours if headache persists or recurs.    No more than 4/week, Disp: 6 mL, Rfl: 11   valACYclovir  (VALTREX ) 500 MG tablet, Take 1 tablet (500 mg total) by mouth 2 (two) times daily for 3 days., Disp: 6 tablet, Rfl: PRN   No Known Allergies  Past Medical History:  Diagnosis Date   Anxiety    Asthma    Back pain    Carpal tunnel syndrome    Hypertension    Post traumatic stress disorder (PTSD)    Postpartum depression      Past Surgical History:  Procedure Laterality Date   CESAREAN SECTION     ULNAR COLLATERAL LIGAMENT REPAIR Right 05/30/2022   Procedure: RIGHT THUMB REPAIR;  Surgeon: Brunilda Capra, MD;  Location: [facility];  Service: Orthopedics;  Laterality: Right;  60 MIN    Family History  Problem Relation Age of Onset   Diabetes Mother    Migraines Mother    Asthma Sister    Asthma Brother    Hypertension Maternal Aunt    Diabetes Maternal Uncle    Hypertension Maternal Grandmother    Diabetes Maternal Grandmother     Social History   Tobacco Use   Smoking status: Some Days    Current packs/day: 0.50    Average packs/day: 0.5 packs/day for 25.5 years (12.7 ttl pk-yrs)    Types: Cigarettes    Start date: 01/21/1998   Smokeless tobacco: Former  Haematologist status: Never Used  Substance Use Topics   Alcohol use: Yes    Comment: social   Drug use: Yes    Types: Marijuana    ROS Refer to HPI for ROS details.  Objective:   Vitals: BP 107/71   Pulse 100   Temp 98.1 F (36.7 C)   Resp 18   LMP 07/05/2023   SpO2 98%   Physical Exam Vitals and nursing note reviewed.  Constitutional:      General: She is not in acute distress.    Appearance: Normal appearance. She is not ill-appearing or toxic-appearing.  HENT:     Head: Normocephalic.   Cardiovascular:     Rate and Rhythm: Normal rate.  Pulmonary:     Effort: Pulmonary effort is normal. No respiratory distress.  Abdominal:     Palpations: Abdomen is soft.     Tenderness: There is no abdominal tenderness. There is no right CVA tenderness or left CVA tenderness.   Skin:    General: Skin is warm and dry.   Neurological:     General: No focal deficit present.     Mental Status: She is alert and oriented to person, place, and time.   Psychiatric:        Mood and Affect: Mood normal.        Behavior: Behavior normal.     Procedures  Results for orders placed or performed during the hospital encounter of 07/08/23 (from the past 24 hours)  POC urinalysis dipstick     Status: Abnormal   Collection Time: 07/08/23  8:43 AM  Result Value Ref Range   Color, UA yellow yellow   Clarity, UA clear clear   Glucose, UA negative negative mg/dL   Bilirubin, UA negative negative   Ketones, POC UA negative negative mg/dL   Spec Grav, UA 1.610 9.604 - 1.025   Blood, UA negative negative   pH, UA 7.0 5.0 - 8.0   Protein Ur, POC negative (A) negative mg/dL   Urobilinogen, UA 0.2 0.2 or 1.0 E.U./dL   Nitrite, UA Negative Negative   Leukocytes, UA Negative Negative    No results found.   Assessment and Plan :     Discharge Instructions       1. 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 -Based on past history had recurrent symptoms of HSV, current symptoms most likely secondary to HSV flareup. - valACYclovir  (VALTREX ) 500 MG tablet; Take 1 tablet (500 mg total) by mouth 2 (two) times daily for 3 days.  Dispense: 6 tablet; Refill: PRN -  Continue to monitor symptoms for any change in severity if there is any escalation of current symptoms or development of new symptoms follow-up in ER for further evaluation and management.        Ceairra Mccarver B Damarie Schoolfield   Briley Sulton, [place], [place] 07/08/23 [phone]

## 2023-07-08 NOTE — ED Triage Notes (Signed)
 Pt presents with complaints of vaginal irritation with urination x 2 days. Pt is on her period. Reports that this is what her herpes break out feels like and she is concerned for that. Denies any urinary symptoms or discharge other than her period. Not concerned for other STD's.

## 2023-08-18 ENCOUNTER — Encounter: Payer: Self-pay | Admitting: Adult Health

## 2023-08-20 NOTE — Telephone Encounter (Signed)
 Can we contact [facility] to increase Vyepti infusions to 300mg  dosing?

## 2023-08-21 MED ORDER — METHYLPREDNISOLONE 4 MG PO TBPK: ORAL_TABLET | ORAL | 0 refills | Status: AC

## 2023-08-21 NOTE — Addendum Note (Signed)
 Addended by: DELFINO AUGUSTIN BROCKS on: 08/21/2023 04:22 PM   Modules accepted: Orders

## 2023-08-21 NOTE — Telephone Encounter (Signed)
 Order placed in Selena Haynes's box for Vyepti infusion to be increased to 300 mg iv

## 2023-08-21 NOTE — Telephone Encounter (Signed)
 Thank you.  Order signed and placed in outbox.

## 2023-08-25 ENCOUNTER — Other Ambulatory Visit: Payer: Self-pay | Admitting: Neurology

## 2023-08-25 MED ORDER — SODIUM CHLORIDE 0.9 % IV SOLN: 300.0000 mg | INTRAVENOUS | 4 refills | Status: AC

## 2023-08-26 ENCOUNTER — Telehealth: Payer: Self-pay | Admitting: Neurology

## 2023-08-26 NOTE — Telephone Encounter (Signed)
 PA submitted for the patient on CMM KEY:[id number]

## 2023-11-19 ENCOUNTER — Other Ambulatory Visit (HOSPITAL_BASED_OUTPATIENT_CLINIC_OR_DEPARTMENT_OTHER): Payer: Self-pay

## 2023-11-19 MED ORDER — FLUZONE 0.5 ML IM SUSY
0.5000 mL | PREFILLED_SYRINGE | Freq: Once | INTRAMUSCULAR | 0 refills | Status: AC
  Filled 2023-11-19: qty 0.5, 1d supply, fill #0

## 2024-01-07 ENCOUNTER — Telehealth: Admitting: Adult Health

## 2024-01-07 NOTE — Progress Notes (Deleted)
 [facility]  PATIENT: Selena Haynes DOB: 1985/10/19  REFERRING DOCTOR OR PCP: Fonda Law, MD; Evalene Skill, MD SOURCE: Patient, chart REASON FOR VISIT: migraine f/u    Virtual Visit via Video Note  I connected with Selena Haynes on 01/07/2024 at  3:15 PM EST by a video enabled telemedicine application and verified that I am speaking with the correct person using two identifiers.  Location: Patient: *** Provider: in office, [facility]   I discussed the limitations of evaluation and management by telemedicine and the availability of in person appointments. The patient expressed understanding and agreed to proceed.    _________________________________   HISTORICAL   HISTORY OF PRESENT ILLNESS:  Selena Haynes is a 38 y.o. woman with headaches.  Update 01/07/2024: Returns for follow-up visit.  Prior visit 06/19/2023  She was started on Vyepti in 03/2023 for daily migraines.  At prior visit, reported no significant improvement after initial Vyepti infusion although only experiencing 10 migraines per month after initial infusion vs daily. Discussed repeating infusion as scheduled and consider increasing dosage after that time.   Mychart message sent in July requesting to up dosage due to more frequent migraines and also prescribed steroid taper pack to break migraine cycle.    At prior visit, recommended initiating Ajovy  due to limited benefit with Emgality .  Unfortunately, she sent MyChart message in January noting continued migraines on Ajovy .  She preferred to pursue Vyepti over Botox, received initial Vyepti infusion 04/07/2023 through [facility] home infusions.   Has not noticed any significant improvement on initial Vyepti infusion.  She reports about 10 migraine days per month although previously noting daily migraines. She is scheduled for repeat infusion in a couple weeks. Use of rizatriptan  with benefit but usually runs out by the end of the month  and will supplement with sumatriptan  injection.   She also mentions right lower eyelid twitching. She feels a constant pressure behind her right eye even when migraine isn't present.  Recently seen by optometrist and received new prescription lenses.  She does admit to limited water intake, some days are better than others, also at times poor nutritional intake.  Denies caffeine use.  Denies any double vision or loss of vision.      Medications tried: Verapamil , muscle relaxants (cyclobenzaprine  and tizanidine ), Topamax  and NSAIDs have not helped.     Imitrex  injections have helped some but HA returns the next day.  Rizatriptan . Emgality  and Ajovy  decreased severity and frequency but still present.  Nerve block only lasted short duration then headaches returned.  Aimovig contraindicated due to underlying constipation. Vyepti   She has had occasional episodic migraines x many years.  These seem to be coming more frequently these were throbbing with N/V and photophobia   Taking an NSAID and laying down would help.   Sleep would end the migraine.    HA History from 2022: Since September, she has had frequent headaches radiating from the right occiput and to the right eye.  Vision seems to be blurry when that happens.  The most intense pain is in the forehead.  When the pain is more intense she also notes symptoms in the right arm.   These seem different than her episodic migraines.   She get nausea and vomiting.   She has waken up with N/V with a headache some times, as well.    These headaches are occurring a couple times a day - about 1/2 or more at night.  She has photophobia and phonophobia.   She also notes pain in the upper cervical spine when  the headaches are intense.         MRI of the brain 09/30/2020 was normal.    MR angiogram of the neck and head 09/30/2020 was normal.   REVIEW OF SYSTEMS: Constitutional: No fevers, chills, sweats, or change in appetite Eyes: No visual changes, double  vision, eye pain Ear, nose and throat: No hearing loss, ear pain, nasal congestion, sore throat Cardiovascular: No chest pain, palpitations Respiratory:  No shortness of breath at rest or with exertion.   No wheezes GastrointestinaI: No nausea, vomiting, diarrhea, abdominal pain, fecal incontinence Genitourinary:  No dysuria, urinary retention or frequency.  No nocturia. Musculoskeletal:  No neck pain, back pain Integumentary: No rash, pruritus, skin lesions Neurological: as above Psychiatric: No depression at this time.  No anxiety Endocrine: No palpitations, diaphoresis, change in appetite, change in weigh or increased thirst Hematologic/Lymphatic:  No anemia, purpura, petechiae. Allergic/Immunologic: No itchy/runny eyes, nasal congestion, recent allergic reactions, rashes  ALLERGIES: No Known Allergies  HOME MEDICATIONS:  Current Outpatient Medications:    sodium chloride  0.9 % SOLN 100 mL with Eptinezumab-jjmr 100 MG/ML SOLN 300 mg, Inject 300 mg into the vein every 3 (three) months., Disp: 100 mL, Rfl: 4   albuterol  (VENTOLIN  HFA) 108 (90 Base) MCG/ACT inhaler, Inhale 1-2 puffs into the lungs every 6 (six) hours as needed for wheezing or shortness of breath., Disp: 18 g, Rfl: 0   budesonide -formoterol  (SYMBICORT ) 160-4.5 MCG/ACT inhaler, Inhale 2 puffs into the lungs 2 (two) times daily., Disp: 10.2 g, Rfl: 0   cetirizine  (ZYRTEC ) 10 MG tablet, Take 1 tablet (10 mg total) by mouth daily., Disp: 30 tablet, Rfl: 12   dicyclomine  (BENTYL ) 20 MG tablet, Take 1 tablet (20 mg total) by mouth 2 (two) times daily., Disp: 20 tablet, Rfl: 0   ELDERBERRY PO, Take by mouth., Disp: , Rfl:    fluconazole  (DIFLUCAN ) 150 MG tablet, Take 1 tablet (150 mg total) by mouth daily., Disp: 1 tablet, Rfl: 0   hydrocortisone  cream 1 %, Apply 1 application topically 2 (two) times daily as needed for itching. otc medication for itching, Disp: , Rfl:    ipratropium-albuterol  (DUONEB) 0.5-2.5 (3) MG/3ML SOLN,  Take 3 mLs by nebulization every 4 (four) hours as needed., Disp: 360 mL, Rfl: 0   methylPREDNISolone  (MEDROL  DOSEPAK) 4 MG TBPK tablet, Take as directed, Disp: 1 each, Rfl: 0   Multiple Vitamins-Minerals (EMERGEN-C IMMUNE PO), Take by mouth., Disp: , Rfl:    pantoprazole  (PROTONIX ) 40 MG tablet, Take 30- 60 min before your first and last meals of the day, Disp: 60 tablet, Rfl: 0   rizatriptan  (MAXALT ) 10 MG tablet, Take 1 tablet (10 mg total) by mouth as needed for migraine (Take 1 tab at onset of migraine. may repeat an additional dose if needed in 2 hours (Do not take more than 2 tablet in 24 hour period)). May repeat in 2 hours if needed, Disp: 12 tablet, Rfl: 11   SUMAtriptan  (IMITREX ) 6 MG/0.5ML SOLN injection, Inject 0.5 mLs (6 mg total) into the skin as needed for migraine or headache. May repeat in 2 hours if headache persists or recurs.    No more than 4/week, Disp: 6 mL, Rfl: 11  PAST MEDICAL HISTORY: Past Medical History:  Diagnosis Date   Anxiety    Asthma    Back pain    Carpal tunnel syndrome    Hypertension  Post traumatic stress disorder (PTSD)    Postpartum depression     PAST SURGICAL HISTORY: Past Surgical History:  Procedure Laterality Date   CESAREAN SECTION     ULNAR COLLATERAL LIGAMENT REPAIR Right 05/30/2022   Procedure: RIGHT THUMB REPAIR;  Surgeon: Murrell Drivers, MD;  Location: [facility];  Service: Orthopedics;  Laterality: Right;  60 MIN    FAMILY HISTORY: Family History  Problem Relation Age of Onset   Diabetes Mother    Migraines Mother    Asthma Sister    Asthma Brother    Hypertension Maternal Aunt    Diabetes Maternal Uncle    Hypertension Maternal Grandmother    Diabetes Maternal Grandmother     SOCIAL HISTORY:  Social History   Socioeconomic History   Marital status: Single    Spouse name: Not on file   Number of children: 3   Years of education: Not on file   Highest education level: High school graduate   Occupational History   Not on file  Tobacco Use   Smoking status: Some Days    Current packs/day: 0.50    Average packs/day: 0.5 packs/day for 26.0 years (13.0 ttl pk-yrs)    Types: Cigarettes    Start date: 01/21/1998   Smokeless tobacco: Former  Building Services Engineer status: Never Used  Substance and Sexual Activity   Alcohol use: Yes    Comment: social   Drug use: Yes    Types: Marijuana   Sexual activity: Yes    Birth control/protection: Condom  Other Topics Concern   Not on file  Social History Narrative   Lives w 3 children   Right handed   Caffeine: about 5 sodas/coffees in a month   Social Drivers of Health   Tobacco Use: High Risk (07/08/2023)   Patient History    Smoking Tobacco Use: Some Days    Smokeless Tobacco Use: Former    Passive Exposure: Not on Actuary Strain: Not on file  Food Insecurity: Not on file  Transportation Needs: Not on file  Physical Activity: Not on file  Stress: Not on file  Social Connections: Unknown (05/22/2021)   [facility]   Social Network    Social Network: Not on file  Intimate Partner Violence: Unknown (05/22/2021)   Received from [facility]   HITS    Physically Hurt: Not on file    Insult or Talk Down To: Not on file    Threaten Physical Harm: Not on file    Scream or Curse: Not on file  Depression (EYV7-0): Not on file  Alcohol Screen: Not on file  Housing: Not on file  Utilities: Not on file  Health Literacy: Not on file     PHYSICAL EXAM  There were no vitals filed for this visit.  There is no height or weight on file to calculate BMI.   General: The patient is well-developed and well-nourished and in no acute distress. Very slight occasional right lower lid twitch   HEENT:  Head is Zap/AT.  Sclera are anicteric.    Skin: Extremities are without rash or  edema.  Musculoskeletal:  Back is nontender  Neurologic Exam  Mental status: The patient is alert and oriented x 3 at  the time of the examination. The patient has apparent normal recent and remote memory, with an apparently normal attention span and concentration ability.   Speech is normal.  Cranial nerves: Extraocular movements are full.  Facial  strength s normal, cold is more intense on right and touch is reduced on righ face.  . No obvious hearing deficits are noted.  Motor:  Muscle bulk is normal.   Tone is normal. Strength is  5 / 5 in all 4 extremities.   Sensory: Sensory testing is intact to touch x 4   Coordination: Cerebellar testing reveals good finger-nose-finger and heel-to-shin bilaterally.  Gait and station: Station is normal.   Gait is normal.  Tandem is normal    Reflexes: Deep tendon reflexes are symmetric and normal bilaterally.        DIAGNOSTIC DATA (LABS, IMAGING, TESTING) - I reviewed patient records, labs, notes, testing and imaging myself where available.  Lab Results  Component Value Date   WBC 10.5 05/26/2023   HGB 14.3 05/26/2023   HCT 41.2 05/26/2023   MCV 88.4 05/26/2023   PLT 322 05/26/2023      Component Value Date/Time   NA 138 05/26/2023 0511   K 3.6 05/26/2023 0511   CL 105 05/26/2023 0511   CO2 23 05/26/2023 0511   GLUCOSE 106 (H) 05/26/2023 0511   BUN 11 05/26/2023 0511   CREATININE 0.99 05/26/2023 0511   CREATININE 0.74 02/26/2016 1433   CALCIUM 9.5 05/26/2023 0511   PROT 6.8 05/26/2023 0511   ALBUMIN 4.0 05/26/2023 0511   AST 14 (L) 05/26/2023 0511   ALT 17 05/26/2023 0511   ALKPHOS 65 05/26/2023 0511   BILITOT 0.7 05/26/2023 0511   GFRNONAA >60 05/26/2023 0511   GFRAA >60 04/22/2016 1111      ASSESSMENT AND PLAN  No diagnosis found.   Continue Vyepti every 62-month infusion.  Consider increasing to 300 mg if needed after next infusion in 2 weeks.  Continue rizatriptan  as needed, continue sumatriptan  injection as needed, advised not to use together.  Discussed limiting use to no more than 2-3 times per week or 8 times per month to avoid  rebound headache Discussed importance of limiting Excedrin use due to potential of analgesic rebound Advised eye twitch likely benign, possibly due to migraine headaches which affect the right side, limited water nutritional intake and possible underlying stressors.  Discussed increasing water intake with at least 64 to 100 ounces of water per day and ensure adequate nutritional intake and management of stressors.  She was encouraged to follow-up with an ophthalmologist if symptoms persist to rule out possible underlying dry eye contributing 4.   Return in 6 months or sooner for new or worsening neurologic symptoms.       I spent 25 minutes of face-to-face and non-face-to-face time with patient.  This included previsit chart review, lab review, study review, order entry, electronic health record documentation, patient education and discussion regarding above diagnoses and treatment plan and answered all other questions to patient's satisfaction  Harlene Bogaert, [facility]  [facility] [address] [place], [place] [address]  Phone [phone] Fax [phone] Note: This document was prepared with digital dictation and possible smart phrase technology. Any transcriptional errors that result from this process are unintentional.
